# Patient Record
Sex: Female | Born: 1950 | Race: White | Hispanic: No | Marital: Married | Smoking: Former smoker
Health system: Southern US, Community
[De-identification: ages and names within clinical notes are randomized; demographics above are authoritative.]

## PROBLEM LIST (undated history)

## (undated) DIAGNOSIS — J449 Chronic obstructive pulmonary disease, unspecified: Secondary | ICD-10-CM

## (undated) DIAGNOSIS — E119 Type 2 diabetes mellitus without complications: Secondary | ICD-10-CM

## (undated) DIAGNOSIS — J189 Pneumonia, unspecified organism: Secondary | ICD-10-CM

## (undated) DIAGNOSIS — C801 Malignant (primary) neoplasm, unspecified: Secondary | ICD-10-CM

## (undated) HISTORY — PX: CERVICAL SPINE SURGERY: SHX589

## (undated) HISTORY — PX: EYE SURGERY: SHX253

## (undated) HISTORY — PX: BREAST ENHANCEMENT SURGERY: SHX7

---

## 1981-07-22 HISTORY — PX: BREAST SURGERY: SHX581

## 1984-07-22 HISTORY — PX: TUBAL LIGATION: SHX77

## 1994-07-22 HISTORY — PX: ABDOMINAL HYSTERECTOMY: SHX81

## 2004-09-19 ENCOUNTER — Ambulatory Visit: Payer: Self-pay | Admitting: Gynecologic Oncology

## 2004-10-02 ENCOUNTER — Ambulatory Visit: Payer: Self-pay | Admitting: Family Medicine

## 2004-12-04 ENCOUNTER — Encounter: Admission: RE | Admit: 2004-12-04 | Discharge: 2004-12-04 | Payer: Self-pay | Admitting: Rheumatology

## 2005-10-09 ENCOUNTER — Ambulatory Visit: Payer: Self-pay | Admitting: Family Medicine

## 2005-10-11 ENCOUNTER — Ambulatory Visit: Payer: Self-pay | Admitting: Family Medicine

## 2005-10-15 ENCOUNTER — Emergency Department (HOSPITAL_COMMUNITY): Admission: EM | Admit: 2005-10-15 | Discharge: 2005-10-15 | Payer: Self-pay | Admitting: Emergency Medicine

## 2005-10-18 ENCOUNTER — Ambulatory Visit: Payer: Self-pay | Admitting: Family Medicine

## 2006-09-12 ENCOUNTER — Ambulatory Visit: Payer: Self-pay | Admitting: Family Medicine

## 2007-12-12 ENCOUNTER — Emergency Department: Payer: Self-pay | Admitting: Emergency Medicine

## 2008-06-14 ENCOUNTER — Ambulatory Visit: Payer: Self-pay | Admitting: Family Medicine

## 2008-08-08 ENCOUNTER — Ambulatory Visit: Payer: Self-pay | Admitting: Family Medicine

## 2009-03-21 DIAGNOSIS — R1084 Generalized abdominal pain: Secondary | ICD-10-CM | POA: Insufficient documentation

## 2009-04-27 ENCOUNTER — Ambulatory Visit: Payer: Self-pay | Admitting: Internal Medicine

## 2009-05-03 ENCOUNTER — Ambulatory Visit: Payer: Self-pay | Admitting: Gastroenterology

## 2009-05-09 ENCOUNTER — Ambulatory Visit: Payer: Self-pay | Admitting: Gastroenterology

## 2009-11-17 ENCOUNTER — Ambulatory Visit: Payer: Self-pay | Admitting: Family Medicine

## 2009-12-01 DIAGNOSIS — J309 Allergic rhinitis, unspecified: Secondary | ICD-10-CM | POA: Insufficient documentation

## 2010-01-03 ENCOUNTER — Ambulatory Visit: Payer: Self-pay | Admitting: Gastroenterology

## 2010-12-19 ENCOUNTER — Inpatient Hospital Stay: Payer: Self-pay | Admitting: Surgery

## 2010-12-20 HISTORY — PX: CHOLECYSTECTOMY: SHX55

## 2010-12-25 LAB — PATHOLOGY REPORT

## 2012-12-23 ENCOUNTER — Ambulatory Visit (HOSPITAL_COMMUNITY): Payer: Self-pay | Admitting: Psychiatry

## 2014-08-29 ENCOUNTER — Inpatient Hospital Stay: Payer: Self-pay | Admitting: Internal Medicine

## 2014-09-12 LAB — TSH: TSH: 1.67 u[IU]/mL (ref 0.41–5.90)

## 2014-09-12 LAB — BASIC METABOLIC PANEL
BUN: 33 mg/dL — AB (ref 4–21)
Creatinine: 0.9 mg/dL (ref 0.5–1.1)
GLUCOSE: 106 mg/dL
Sodium: 142 mmol/L (ref 137–147)

## 2014-09-14 ENCOUNTER — Inpatient Hospital Stay: Payer: Self-pay | Admitting: Internal Medicine

## 2014-10-22 ENCOUNTER — Other Ambulatory Visit
Admit: 2014-10-22 | Disposition: A | Discharge status: Home / Self Care | Payer: Self-pay | Attending: Physician Assistant | Admitting: Physician Assistant

## 2014-10-22 ENCOUNTER — Other Ambulatory Visit
Admit: 2014-10-22 | Disposition: A | Discharge status: Home / Self Care | Payer: Self-pay | Attending: Family Medicine | Admitting: Family Medicine

## 2014-10-22 LAB — URINALYSIS, COMPLETE
BLOOD: NEGATIVE
Bilirubin,UR: NEGATIVE
GLUCOSE, UR: NEGATIVE mg/dL (ref 0–75)
NITRITE: NEGATIVE
Ph: 5 (ref 4.5–8.0)
RBC,UR: NONE SEEN /HPF (ref 0–5)
SPECIFIC GRAVITY: 1.023 (ref 1.003–1.030)
Squamous Epithelial: 15
WBC UR: 1 /HPF (ref 0–5)

## 2014-10-22 LAB — RAPID INFLUENZA A&B ANTIGENS

## 2014-10-23 ENCOUNTER — Emergency Department
Admit: 2014-10-23 | Disposition: A | Discharge status: Other Acute Inpatient Hospital | Payer: Self-pay | Admitting: Student

## 2014-10-23 LAB — COMPREHENSIVE METABOLIC PANEL
ALK PHOS: 118 U/L
Albumin: 2.7 g/dL — ABNORMAL LOW
Anion Gap: 13 (ref 7–16)
BUN: 40 mg/dL — ABNORMAL HIGH
Bilirubin,Total: 0.6 mg/dL
Calcium, Total: 8.6 mg/dL — ABNORMAL LOW
Chloride: 93 mmol/L — ABNORMAL LOW
Co2: 26 mmol/L
Creatinine: 2.19 mg/dL — ABNORMAL HIGH
EGFR (Non-African Amer.): 23 — ABNORMAL LOW
GFR CALC AF AMER: 27 — AB
GLUCOSE: 117 mg/dL — AB
POTASSIUM: 4 mmol/L
SGOT(AST): 24 U/L
SGPT (ALT): 10 U/L — ABNORMAL LOW
Sodium: 132 mmol/L — ABNORMAL LOW
TOTAL PROTEIN: 6.8 g/dL

## 2014-10-23 LAB — CBC
HCT: 42.3 % (ref 35.0–47.0)
HGB: 13.1 g/dL (ref 12.0–16.0)
MCH: 26.5 pg (ref 26.0–34.0)
MCHC: 31 g/dL — AB (ref 32.0–36.0)
MCV: 86 fL (ref 80–100)
PLATELETS: 373 10*3/uL (ref 150–440)
RBC: 4.94 10*6/uL (ref 3.80–5.20)
RDW: 14.7 % — AB (ref 11.5–14.5)
WBC: 40.8 10*3/uL — ABNORMAL HIGH (ref 3.6–11.0)

## 2014-10-23 LAB — URINALYSIS, COMPLETE
BILIRUBIN, UR: NEGATIVE
BLOOD: NEGATIVE
GLUCOSE, UR: NEGATIVE mg/dL (ref 0–75)
Hyaline Cast: 10
Ketone: NEGATIVE
Nitrite: NEGATIVE
PH: 5 (ref 4.5–8.0)
Protein: 30
RBC,UR: 20 /HPF (ref 0–5)
SPECIFIC GRAVITY: 1.026 (ref 1.003–1.030)
WBC UR: 15 /HPF (ref 0–5)

## 2014-10-23 LAB — MAGNESIUM: Magnesium: 1.8 mg/dL

## 2014-10-23 LAB — TROPONIN I: Troponin-I: 0.08 ng/mL — ABNORMAL HIGH

## 2014-10-23 LAB — PHOSPHORUS: Phosphorus: 4.5 mg/dL

## 2014-10-23 LAB — CLOSTRIDIUM DIFFICILE(ARMC)

## 2014-10-23 LAB — LACTIC ACID, PLASMA: LACTIC ACID, VENOUS: 2.4 mmol/L — AB

## 2014-10-23 LAB — PROTIME-INR
INR: 1.1
Prothrombin Time: 14 secs

## 2014-10-24 DIAGNOSIS — F05 Delirium due to known physiological condition: Secondary | ICD-10-CM | POA: Insufficient documentation

## 2014-10-24 DIAGNOSIS — Z87898 Personal history of other specified conditions: Secondary | ICD-10-CM | POA: Insufficient documentation

## 2014-10-24 DIAGNOSIS — Z8768 Personal history of other (corrected) conditions arising in the perinatal period: Secondary | ICD-10-CM | POA: Insufficient documentation

## 2014-10-28 LAB — CULTURE, BLOOD (SINGLE)

## 2014-10-31 DIAGNOSIS — S37009A Unspecified injury of unspecified kidney, initial encounter: Secondary | ICD-10-CM | POA: Insufficient documentation

## 2014-10-31 LAB — URINE CULTURE

## 2014-11-10 ENCOUNTER — Inpatient Hospital Stay
Admission: EM | Admit: 2014-11-10 | Discharge: 2014-11-28 | DRG: 871 | Disposition: A | Discharge status: Skilled Nursing Facility | Payer: BLUE CROSS/BLUE SHIELD | Attending: Internal Medicine | Admitting: Internal Medicine

## 2014-11-10 DIAGNOSIS — J9600 Acute respiratory failure, unspecified whether with hypoxia or hypercapnia: Secondary | ICD-10-CM

## 2014-11-10 DIAGNOSIS — Z888 Allergy status to other drugs, medicaments and biological substances status: Secondary | ICD-10-CM | POA: Diagnosis not present

## 2014-11-10 DIAGNOSIS — Z880 Allergy status to penicillin: Secondary | ICD-10-CM

## 2014-11-10 DIAGNOSIS — K219 Gastro-esophageal reflux disease without esophagitis: Secondary | ICD-10-CM | POA: Diagnosis present

## 2014-11-10 DIAGNOSIS — R918 Other nonspecific abnormal finding of lung field: Secondary | ICD-10-CM

## 2014-11-10 DIAGNOSIS — R627 Adult failure to thrive: Secondary | ICD-10-CM | POA: Diagnosis present

## 2014-11-10 DIAGNOSIS — Z8701 Personal history of pneumonia (recurrent): Secondary | ICD-10-CM

## 2014-11-10 DIAGNOSIS — Z9981 Dependence on supplemental oxygen: Secondary | ICD-10-CM

## 2014-11-10 DIAGNOSIS — Z79899 Other long term (current) drug therapy: Secondary | ICD-10-CM | POA: Diagnosis not present

## 2014-11-10 DIAGNOSIS — J449 Chronic obstructive pulmonary disease, unspecified: Secondary | ICD-10-CM | POA: Diagnosis present

## 2014-11-10 DIAGNOSIS — J9611 Chronic respiratory failure with hypoxia: Secondary | ICD-10-CM | POA: Diagnosis present

## 2014-11-10 DIAGNOSIS — J9621 Acute and chronic respiratory failure with hypoxia: Secondary | ICD-10-CM | POA: Diagnosis present

## 2014-11-10 DIAGNOSIS — F411 Generalized anxiety disorder: Secondary | ICD-10-CM | POA: Diagnosis present

## 2014-11-10 DIAGNOSIS — Z791 Long term (current) use of non-steroidal anti-inflammatories (NSAID): Secondary | ICD-10-CM

## 2014-11-10 DIAGNOSIS — Z7951 Long term (current) use of inhaled steroids: Secondary | ICD-10-CM | POA: Diagnosis not present

## 2014-11-10 DIAGNOSIS — A419 Sepsis, unspecified organism: Principal | ICD-10-CM | POA: Diagnosis present

## 2014-11-10 DIAGNOSIS — Z882 Allergy status to sulfonamides status: Secondary | ICD-10-CM | POA: Diagnosis not present

## 2014-11-10 DIAGNOSIS — F329 Major depressive disorder, single episode, unspecified: Secondary | ICD-10-CM | POA: Diagnosis not present

## 2014-11-10 DIAGNOSIS — J189 Pneumonia, unspecified organism: Secondary | ICD-10-CM | POA: Diagnosis present

## 2014-11-10 DIAGNOSIS — Z885 Allergy status to narcotic agent status: Secondary | ICD-10-CM

## 2014-11-10 DIAGNOSIS — Z91040 Latex allergy status: Secondary | ICD-10-CM

## 2014-11-10 DIAGNOSIS — Y95 Nosocomial condition: Secondary | ICD-10-CM | POA: Diagnosis present

## 2014-11-10 DIAGNOSIS — Z8249 Family history of ischemic heart disease and other diseases of the circulatory system: Secondary | ICD-10-CM

## 2014-11-10 DIAGNOSIS — Z8589 Personal history of malignant neoplasm of other organs and systems: Secondary | ICD-10-CM

## 2014-11-10 DIAGNOSIS — E875 Hyperkalemia: Secondary | ICD-10-CM | POA: Diagnosis not present

## 2014-11-10 DIAGNOSIS — Z91013 Allergy to seafood: Secondary | ICD-10-CM

## 2014-11-10 DIAGNOSIS — E119 Type 2 diabetes mellitus without complications: Secondary | ICD-10-CM | POA: Diagnosis present

## 2014-11-10 DIAGNOSIS — Z881 Allergy status to other antibiotic agents status: Secondary | ICD-10-CM

## 2014-11-10 DIAGNOSIS — R410 Disorientation, unspecified: Secondary | ICD-10-CM

## 2014-11-10 DIAGNOSIS — Z87891 Personal history of nicotine dependence: Secondary | ICD-10-CM

## 2014-11-10 DIAGNOSIS — Z886 Allergy status to analgesic agent status: Secondary | ICD-10-CM

## 2014-11-10 DIAGNOSIS — R6521 Severe sepsis with septic shock: Secondary | ICD-10-CM | POA: Diagnosis present

## 2014-11-10 DIAGNOSIS — D638 Anemia in other chronic diseases classified elsewhere: Secondary | ICD-10-CM | POA: Diagnosis present

## 2014-11-10 DIAGNOSIS — J439 Emphysema, unspecified: Secondary | ICD-10-CM | POA: Diagnosis present

## 2014-11-10 HISTORY — DX: Malignant (primary) neoplasm, unspecified: C80.1

## 2014-11-10 HISTORY — DX: Type 2 diabetes mellitus without complications: E11.9

## 2014-11-10 HISTORY — DX: Chronic obstructive pulmonary disease, unspecified: J44.9

## 2014-11-10 HISTORY — DX: Pneumonia, unspecified organism: J18.9

## 2014-11-10 LAB — CBC
HCT: 36 % (ref 35.0–47.0)
HGB: 10.7 g/dL — ABNORMAL LOW (ref 12.0–16.0)
MCH: 26.7 pg (ref 26.0–34.0)
MCHC: 29.8 g/dL — ABNORMAL LOW (ref 32.0–36.0)
MCV: 90 fL (ref 80–100)
PLATELETS: 487 10*3/uL — AB (ref 150–440)
RBC: 4.01 10*6/uL (ref 3.80–5.20)
RDW: 17.8 % — AB (ref 11.5–14.5)
WBC: 33.7 10*3/uL — AB (ref 3.6–11.0)

## 2014-11-10 LAB — COMPREHENSIVE METABOLIC PANEL
ALT: 20 U/L
Albumin: 2.9 g/dL — ABNORMAL LOW
Alkaline Phosphatase: 81 U/L
Anion Gap: 13 (ref 7–16)
BILIRUBIN TOTAL: 1 mg/dL
BUN: 19 mg/dL
CHLORIDE: 105 mmol/L
Calcium, Total: 9.2 mg/dL
Co2: 21 mmol/L — ABNORMAL LOW
Creatinine: 0.99 mg/dL
EGFR (African American): >60
Glucose: 203 mg/dL — ABNORMAL HIGH
POTASSIUM: 4.6 mmol/L
SGOT(AST): 46 U/L — ABNORMAL HIGH
Sodium: 139 mmol/L
TOTAL PROTEIN: 6.9 g/dL

## 2014-11-10 LAB — PROTIME-INR
INR: 1.2
Prothrombin Time: 15.1 secs — ABNORMAL HIGH

## 2014-11-10 LAB — PRO B NATRIURETIC PEPTIDE: B-Type Natriuretic Peptide: 455 pg/mL — ABNORMAL HIGH

## 2014-11-10 LAB — TROPONIN I: Troponin-I: <0.03 ng/mL

## 2014-11-10 LAB — CK TOTAL AND CKMB (NOT AT ARMC)
CK, TOTAL: 34 U/L — AB
CK-MB: 2.4 ng/mL

## 2014-11-10 LAB — LACTIC ACID, PLASMA: Lactic Acid, Venous: 7.9 mmol/L

## 2014-11-11 LAB — BASIC METABOLIC PANEL
ANION GAP: 6 — AB (ref 7–16)
BUN: 24 mg/dL — ABNORMAL HIGH
CALCIUM: 8.4 mg/dL — AB
CHLORIDE: 113 mmol/L — AB
CO2: 20 mmol/L — AB
Creatinine: 0.83 mg/dL
Glucose: 182 mg/dL — ABNORMAL HIGH
Potassium: 4.1 mmol/L
Sodium: 139 mmol/L

## 2014-11-11 LAB — CBC WITH DIFFERENTIAL/PLATELET
BASOS ABS: 0 10*3/uL (ref 0.0–0.1)
Basophil %: 0.2 %
EOS ABS: 0 10*3/uL (ref 0.0–0.7)
Eosinophil %: 0 %
HCT: 29.1 % — ABNORMAL LOW (ref 35.0–47.0)
HGB: 8.9 g/dL — ABNORMAL LOW (ref 12.0–16.0)
Lymphocyte #: 1.1 10*3/uL (ref 1.0–3.6)
Lymphocyte %: 5.2 %
MCH: 27 pg (ref 26.0–34.0)
MCHC: 30.5 g/dL — AB (ref 32.0–36.0)
MCV: 89 fL (ref 80–100)
MONO ABS: 0.6 x10 3/mm (ref 0.2–0.9)
Monocyte %: 2.9 %
NEUTROS PCT: 91.7 %
Neutrophil #: 19.3 10*3/uL — ABNORMAL HIGH (ref 1.4–6.5)
PLATELETS: 419 10*3/uL (ref 150–440)
RBC: 3.28 10*6/uL — AB (ref 3.80–5.20)
RDW: 17.7 % — ABNORMAL HIGH (ref 11.5–14.5)
WBC: 21.1 10*3/uL — ABNORMAL HIGH (ref 3.6–11.0)

## 2014-11-11 LAB — TROPONIN I

## 2014-11-12 LAB — BASIC METABOLIC PANEL
Anion Gap: 5 — ABNORMAL LOW (ref 7–16)
BUN: 27 mg/dL — AB
CHLORIDE: 108 mmol/L
Calcium, Total: 8.8 mg/dL — ABNORMAL LOW
Co2: 23 mmol/L
Creatinine: 0.73 mg/dL
EGFR (Non-African Amer.): >60
GLUCOSE: 124 mg/dL — AB
Potassium: 3.9 mmol/L
Sodium: 136 mmol/L

## 2014-11-12 LAB — CBC WITH DIFFERENTIAL/PLATELET
BASOS PCT: 0.1 %
Basophil #: 0 10*3/uL (ref 0.0–0.1)
EOS PCT: 0 %
Eosinophil #: 0 10*3/uL (ref 0.0–0.7)
HCT: 25.8 % — ABNORMAL LOW (ref 35.0–47.0)
HGB: 8 g/dL — ABNORMAL LOW (ref 12.0–16.0)
Lymphocyte #: 0.7 10*3/uL — ABNORMAL LOW (ref 1.0–3.6)
Lymphocyte %: 4.4 %
MCH: 27.2 pg (ref 26.0–34.0)
MCHC: 30.9 g/dL — ABNORMAL LOW (ref 32.0–36.0)
MCV: 88 fL (ref 80–100)
MONO ABS: 0.7 x10 3/mm (ref 0.2–0.9)
Monocyte %: 4.2 %
NEUTROS PCT: 91.3 %
Neutrophil #: 14.5 10*3/uL — ABNORMAL HIGH (ref 1.4–6.5)
Platelet: 384 10*3/uL (ref 150–440)
RBC: 2.93 10*6/uL — ABNORMAL LOW (ref 3.80–5.20)
RDW: 17.7 % — ABNORMAL HIGH (ref 11.5–14.5)
WBC: 15.8 10*3/uL — ABNORMAL HIGH (ref 3.6–11.0)

## 2014-11-12 LAB — PHOSPHORUS: Phosphorus: 3.3 mg/dL

## 2014-11-12 LAB — MAGNESIUM: MAGNESIUM: 1.8 mg/dL

## 2014-11-13 LAB — IRON AND TIBC
Iron Bind.Cap.(Total): 234 — ABNORMAL LOW (ref 250–450)
Iron Saturation: 21.4
Iron: 50 ug/dL
Unbound Iron-Bind.Cap.: 183.8

## 2014-11-13 LAB — BASIC METABOLIC PANEL
Anion Gap: 2 — ABNORMAL LOW (ref 7–16)
BUN: 36 mg/dL — ABNORMAL HIGH
Calcium, Total: 9 mg/dL
Chloride: 111 mmol/L
Co2: 25 mmol/L
Creatinine: 0.73 mg/dL
EGFR (African American): >60
EGFR (Non-African Amer.): >60
GLUCOSE: 122 mg/dL — AB
Potassium: 4.3 mmol/L
SODIUM: 138 mmol/L

## 2014-11-13 LAB — VANCOMYCIN, TROUGH: VANCOMYCIN, TROUGH: 11 ug/mL

## 2014-11-13 LAB — FERRITIN: Ferritin (ARMC): 205 ng/mL

## 2014-11-13 LAB — CBC WITH DIFFERENTIAL/PLATELET
Basophil #: 0 10*3/uL (ref 0.0–0.1)
Basophil %: 0.1 %
Eosinophil #: 0 10*3/uL (ref 0.0–0.7)
Eosinophil %: 0 %
HCT: 24.8 % — ABNORMAL LOW (ref 35.0–47.0)
HGB: 7.7 g/dL — AB (ref 12.0–16.0)
LYMPHS PCT: 5.1 %
Lymphocyte #: 0.5 10*3/uL — ABNORMAL LOW (ref 1.0–3.6)
MCH: 27.2 pg (ref 26.0–34.0)
MCHC: 31.2 g/dL — ABNORMAL LOW (ref 32.0–36.0)
MCV: 87 fL (ref 80–100)
Monocyte #: 0.6 x10 3/mm (ref 0.2–0.9)
Monocyte %: 5.5 %
NEUTROS PCT: 89.3 %
Neutrophil #: 9.1 10*3/uL — ABNORMAL HIGH (ref 1.4–6.5)
PLATELETS: 371 10*3/uL (ref 150–440)
RBC: 2.85 10*6/uL — AB (ref 3.80–5.20)
RDW: 17.3 % — ABNORMAL HIGH (ref 11.5–14.5)
WBC: 10.1 10*3/uL (ref 3.6–11.0)

## 2014-11-14 LAB — CBC WITH DIFFERENTIAL/PLATELET
Bands: 0 %
HCT: 26.6 % — ABNORMAL LOW (ref 35.0–47.0)
HGB: 8.3 g/dL — ABNORMAL LOW (ref 12.0–16.0)
LYMPHS PCT: 12 %
MCH: 27 pg (ref 26.0–34.0)
MCHC: 31 g/dL — ABNORMAL LOW (ref 32.0–36.0)
MCV: 87 fL (ref 80–100)
MYELOCYTE: 2 %
Metamyelocyte: 5 %
Monocytes: 3 %
PLATELETS: 403 10*3/uL (ref 150–440)
RBC: 3.06 10*6/uL — AB (ref 3.80–5.20)
RDW: 17.2 % — ABNORMAL HIGH (ref 11.5–14.5)
SEGMENTED NEUTROPHILS: 78 %
WBC: 12.4 10*3/uL — AB (ref 3.6–11.0)

## 2014-11-14 LAB — BASIC METABOLIC PANEL
Anion Gap: 1 — ABNORMAL LOW (ref 7–16)
BUN: 35 mg/dL — ABNORMAL HIGH
CHLORIDE: 109 mmol/L
CO2: 31 mmol/L
Calcium, Total: 9.2 mg/dL
Creatinine: 0.6 mg/dL
GLUCOSE: 116 mg/dL — AB
Potassium: 4.8 mmol/L
Sodium: 141 mmol/L

## 2014-11-14 LAB — EXPECTORATED SPUTUM ASSESSMENT W REFEX TO RESP CULTURE

## 2014-11-14 LAB — VANCOMYCIN, TROUGH: Vancomycin, Trough: 21 ug/mL

## 2014-11-15 LAB — BASIC METABOLIC PANEL
Anion Gap: 2 — ABNORMAL LOW (ref 7–16)
BUN: 33 mg/dL — ABNORMAL HIGH
CO2: 32 mmol/L
Calcium, Total: 9.1 mg/dL
Chloride: 105 mmol/L
Creatinine: 0.5 mg/dL
EGFR (African American): >60
EGFR (Non-African Amer.): >60
GLUCOSE: 111 mg/dL — AB
Potassium: 4.2 mmol/L
Sodium: 139 mmol/L

## 2014-11-15 LAB — CBC WITH DIFFERENTIAL/PLATELET
BANDS NEUTROPHIL: 3 %
HCT: 26 % — ABNORMAL LOW (ref 35.0–47.0)
HGB: 8.2 g/dL — AB (ref 12.0–16.0)
LYMPHS PCT: 18 %
MCH: 26.9 pg (ref 26.0–34.0)
MCHC: 31.4 g/dL — ABNORMAL LOW (ref 32.0–36.0)
MCV: 86 fL (ref 80–100)
Metamyelocyte: 7 %
Monocytes: 7 %
Myelocyte: 2 %
Platelet: 400 10*3/uL (ref 150–440)
RBC: 3.03 10*6/uL — AB (ref 3.80–5.20)
RDW: 17.2 % — ABNORMAL HIGH (ref 11.5–14.5)
SEGMENTED NEUTROPHILS: 63 %
WBC: 12.2 10*3/uL — AB (ref 3.6–11.0)

## 2014-11-15 LAB — CULTURE, BLOOD (SINGLE)

## 2014-11-16 LAB — CBC WITH DIFFERENTIAL/PLATELET
Bands: 1 %
Eosinophil: 2 %
HCT: 29.1 % — ABNORMAL LOW (ref 35.0–47.0)
HGB: 9 g/dL — ABNORMAL LOW (ref 12.0–16.0)
Lymphocytes: 15 %
MCH: 26.5 pg (ref 26.0–34.0)
MCHC: 30.9 g/dL — ABNORMAL LOW (ref 32.0–36.0)
MCV: 86 fL (ref 80–100)
METAMYELOCYTE: 8 %
MONOS PCT: 12 %
Myelocyte: 8 %
PLATELETS: 487 10*3/uL — AB (ref 150–440)
RBC: 3.39 10*6/uL — ABNORMAL LOW (ref 3.80–5.20)
RDW: 17.3 % — ABNORMAL HIGH (ref 11.5–14.5)
Segmented Neutrophils: 54 %
WBC: 18.7 10*3/uL — ABNORMAL HIGH (ref 3.6–11.0)

## 2014-11-16 LAB — BASIC METABOLIC PANEL
ANION GAP: 5 — AB (ref 7–16)
BUN: 33 mg/dL — AB
Calcium, Total: 8.9 mg/dL
Chloride: 100 mmol/L — ABNORMAL LOW
Co2: 34 mmol/L — ABNORMAL HIGH
Creatinine: 0.43 mg/dL — ABNORMAL LOW
EGFR (African American): >60
EGFR (Non-African Amer.): >60
GLUCOSE: 112 mg/dL — AB
POTASSIUM: 4 mmol/L
Sodium: 139 mmol/L

## 2014-11-16 LAB — BRONCHIAL WASH CULTURE

## 2014-11-17 LAB — BASIC METABOLIC PANEL
ANION GAP: 6 — AB (ref 7–16)
BUN: 20 mg/dL
CALCIUM: 8.8 mg/dL — AB
Chloride: 93 mmol/L — ABNORMAL LOW
Co2: 38 mmol/L — ABNORMAL HIGH
Creatinine: 0.44 mg/dL
EGFR (Non-African Amer.): >60
Glucose: 112 mg/dL — ABNORMAL HIGH
Potassium: 3.3 mmol/L — ABNORMAL LOW
Sodium: 137 mmol/L

## 2014-11-17 LAB — CBC WITH DIFFERENTIAL/PLATELET
Comment - H1-Com5: NORMAL
HCT: 29 % — ABNORMAL LOW (ref 35.0–47.0)
HGB: 9.1 g/dL — ABNORMAL LOW (ref 12.0–16.0)
Lymphocytes: 27 %
MCH: 26.7 pg (ref 26.0–34.0)
MCHC: 31.4 g/dL — AB (ref 32.0–36.0)
MCV: 85 fL (ref 80–100)
MONOS PCT: 7 %
Myelocyte: 1 %
PLATELETS: 511 10*3/uL — AB (ref 150–440)
RBC: 3.41 10*6/uL — ABNORMAL LOW (ref 3.80–5.20)
RDW: 17.8 % — ABNORMAL HIGH (ref 11.5–14.5)
Segmented Neutrophils: 65 %
WBC: 18 10*3/uL — AB (ref 3.6–11.0)

## 2014-11-18 LAB — BASIC METABOLIC PANEL
ANION GAP: 7 (ref 7–16)
BUN: 12 mg/dL
CALCIUM: 8.9 mg/dL
Chloride: 95 mmol/L — ABNORMAL LOW
Co2: 36 mmol/L — ABNORMAL HIGH
Creatinine: 0.49 mg/dL
EGFR (African American): >60
EGFR (Non-African Amer.): >60
Glucose: 129 mg/dL — ABNORMAL HIGH
Potassium: 3.2 mmol/L — ABNORMAL LOW
SODIUM: 138 mmol/L

## 2014-11-18 LAB — PHOSPHORUS: Phosphorus: 2.5 mg/dL

## 2014-11-18 LAB — MAGNESIUM: Magnesium: 1.6 mg/dL — ABNORMAL LOW

## 2014-11-18 LAB — HEMOGLOBIN A1C: Hemoglobin A1C: 5.3 %

## 2014-11-19 MED ORDER — ONDANSETRON HCL 4 MG/2ML IJ SOLN
4.0000 mg | INTRAMUSCULAR | Status: DC | PRN
  Filled 2014-11-19: qty 2

## 2014-11-19 MED ORDER — SODIUM CHLORIDE 0.9 % IJ SOLN
5.0000 mL | Freq: Every day | INTRAMUSCULAR | Status: DC
  Administered 2014-11-20 – 2014-11-21 (×2): 5 mL via INTRAVENOUS

## 2014-11-19 MED ORDER — BUDESONIDE 0.5 MG/2ML IN SUSP
0.5000 mg | Freq: Two times a day (BID) | RESPIRATORY_TRACT | Status: DC
  Administered 2014-11-20: 0.5 mg via RESPIRATORY_TRACT
  Filled 2014-11-19: qty 2

## 2014-11-19 MED ORDER — DULOXETINE HCL 60 MG PO CPEP
90.0000 mg | ORAL_CAPSULE | ORAL | Status: DC
  Administered 2014-11-20 – 2014-11-28 (×8): 90 mg via ORAL
  Filled 2014-11-19 (×10): qty 1

## 2014-11-19 MED ORDER — LORAZEPAM 2 MG/ML IJ SOLN: 1.0000 mg | INTRAMUSCULAR | Status: DC | PRN

## 2014-11-19 MED ORDER — ENSURE ENLIVE PO LIQD
237.0000 mL | Freq: Two times a day (BID) | ORAL | Status: DC
  Administered 2014-11-20 – 2014-11-27 (×14): 237 mL via ORAL

## 2014-11-19 MED ORDER — MORPHINE SULFATE 15 MG PO TABS
7.5000 mg | ORAL_TABLET | ORAL | Status: DC | PRN
  Administered 2014-11-20 – 2014-11-21 (×4): 7.5 mg via ORAL
  Administered 2014-11-21: 22:00:00 via ORAL
  Administered 2014-11-22: 15 mg via ORAL
  Administered 2014-11-22 – 2014-11-28 (×10): 7.5 mg via ORAL
  Filled 2014-11-19 (×16): qty 1

## 2014-11-19 MED ORDER — HEPARIN SODIUM (PORCINE) 5000 UNIT/ML IJ SOLN
5000.0000 [IU] | Freq: Three times a day (TID) | INTRAMUSCULAR | Status: DC
  Administered 2014-11-20 – 2014-11-28 (×24): 5000 [IU] via SUBCUTANEOUS
  Filled 2014-11-19 (×24): qty 1

## 2014-11-19 MED ORDER — POTASSIUM CHLORIDE CRYS ER 20 MEQ PO TBCR
20.0000 meq | EXTENDED_RELEASE_TABLET | Freq: Every day | ORAL | Status: DC
  Administered 2014-11-20 – 2014-11-26 (×7): 20 meq via ORAL
  Filled 2014-11-19 (×7): qty 1

## 2014-11-19 MED ORDER — GUAIFENESIN ER 600 MG PO TB12
600.0000 mg | ORAL_TABLET | Freq: Two times a day (BID) | ORAL | Status: DC
  Administered 2014-11-20 – 2014-11-27 (×16): 600 mg via ORAL
  Filled 2014-11-19 (×16): qty 1

## 2014-11-19 MED ORDER — FLUTICASONE PROPIONATE 50 MCG/ACT NA SUSP
2.0000 | Freq: Every day | NASAL | Status: DC
  Administered 2014-11-20 – 2014-11-28 (×9): 2 via NASAL
  Filled 2014-11-19 (×2): qty 16

## 2014-11-19 MED ORDER — BUDESONIDE-FORMOTEROL FUMARATE 160-4.5 MCG/ACT IN AERO
2.0000 | INHALATION_SPRAY | Freq: Two times a day (BID) | RESPIRATORY_TRACT | Status: DC
  Administered 2014-11-20 – 2014-11-28 (×16): 2 via RESPIRATORY_TRACT

## 2014-11-19 MED ORDER — RABEPRAZOLE SODIUM 20 MG PO TBEC
20.0000 mg | DELAYED_RELEASE_TABLET | Freq: Every day | ORAL | Status: DC
  Administered 2014-11-20 – 2014-11-28 (×9): 20 mg via ORAL

## 2014-11-19 MED ORDER — INSULIN ASPART 100 UNIT/ML ~~LOC~~ SOLN
0.0000 [IU] | SUBCUTANEOUS | Status: DC
  Administered 2014-11-20: 2 [IU] via SUBCUTANEOUS
  Administered 2014-11-21: 4 [IU] via SUBCUTANEOUS
  Administered 2014-11-22: 2 [IU] via SUBCUTANEOUS
  Administered 2014-11-23: 4 [IU] via SUBCUTANEOUS
  Filled 2014-11-19 (×2): qty 4
  Filled 2014-11-19 (×2): qty 2

## 2014-11-19 MED ORDER — SENNOSIDES 8.8 MG/5ML PO SYRP
10.0000 mL | ORAL_SOLUTION | Freq: Three times a day (TID) | ORAL | Status: DC | PRN
  Filled 2014-11-19 (×2): qty 10

## 2014-11-19 MED ORDER — ACETAMINOPHEN 325 MG PO TABS: 650.0000 mg | ORAL_TABLET | ORAL | Status: DC | PRN

## 2014-11-19 MED ORDER — SODIUM CHLORIDE 0.9 % IV SOLN
1.0000 g | Freq: Three times a day (TID) | INTRAVENOUS | Status: AC
  Administered 2014-11-20 (×3): 1 g via INTRAVENOUS
  Filled 2014-11-19: qty 1

## 2014-11-19 MED ORDER — ALPRAZOLAM 0.25 MG PO TABS
0.2500 mg | ORAL_TABLET | Freq: Three times a day (TID) | ORAL | Status: DC
  Administered 2014-11-20 – 2014-11-27 (×20): 0.25 mg via ORAL
  Filled 2014-11-19 (×21): qty 1

## 2014-11-19 MED ORDER — IPRATROPIUM-ALBUTEROL 0.5-2.5 (3) MG/3ML IN SOLN
3.0000 mL | RESPIRATORY_TRACT | Status: DC
  Administered 2014-11-20 (×6): 3 mL via RESPIRATORY_TRACT
  Filled 2014-11-19 (×4): qty 3

## 2014-11-19 MED ORDER — ATORVASTATIN CALCIUM 20 MG PO TABS
40.0000 mg | ORAL_TABLET | Freq: Every day | ORAL | Status: DC
  Administered 2014-11-20 – 2014-11-27 (×7): 40 mg via ORAL
  Filled 2014-11-19 (×7): qty 2

## 2014-11-19 MED ORDER — FAMOTIDINE 20 MG PO TABS
20.0000 mg | ORAL_TABLET | ORAL | Status: DC
  Administered 2014-11-20 – 2014-11-27 (×8): 20 mg via ORAL
  Filled 2014-11-19 (×8): qty 1

## 2014-11-19 MED ORDER — SODIUM CHLORIDE 0.9 % IJ SOLN: 5.0000 mL | INTRAMUSCULAR | Status: DC | PRN

## 2014-11-20 LAB — GLUCOSE, CAPILLARY
GLUCOSE-CAPILLARY: 82 mg/dL (ref 70–99)
GLUCOSE-CAPILLARY: 111 mg/dL — AB (ref 70–99)
Glucose-Capillary: 84 mg/dL (ref 70–99)
Glucose-Capillary: 127 mg/dL — ABNORMAL HIGH (ref 70–99)
Glucose-Capillary: 90 mg/dL (ref 70–99)

## 2014-11-20 MED ORDER — IPRATROPIUM-ALBUTEROL 0.5-2.5 (3) MG/3ML IN SOLN
3.0000 mL | Freq: Four times a day (QID) | RESPIRATORY_TRACT | Status: DC
  Administered 2014-11-21 – 2014-11-28 (×27): 3 mL via RESPIRATORY_TRACT
  Filled 2014-11-20 (×28): qty 3

## 2014-11-20 MED ORDER — IPRATROPIUM-ALBUTEROL 0.5-2.5 (3) MG/3ML IN SOLN
3.0000 mL | RESPIRATORY_TRACT | Status: DC | PRN
  Administered 2014-11-21: 3 mL via RESPIRATORY_TRACT
  Filled 2014-11-20: qty 3

## 2014-11-20 NOTE — H&P (Signed)
PATIENT NAME:  Tiffany Mclean, Tiffany Mclean MR#:  [id number] DATE OF BIRTH:  October 14, 1950  DATE OF ADMISSION:  08/29/2014  ADMITTING PHYSICIAN:  Gladstone Lighter, MD  PRIMARY CARE PHYSICIAN: Kirstie Peri. Fisher, MD  CHIEF COMPLAINT: Decreased oxygenation on exertion.   HISTORY OF PRESENT ILLNESS: Tiffany Mclean is a 64 year old Caucasian female with past medical history significant for chronic respiratory failure secondary to COPD on 2 liters home oxygen, peripheral neuropathy, fibromyalgia, anxiety, depression, gastroesophageal reflex disease, who presents from PCP's office secondary to hypoxia. The patient states she is wheelchair-bound at baseline. Denies any worsening of her dyspnea than baseline.  She always has some chronic cough which is not worsened or has  changed. She always has fevers, chills, hot flushes all the time. She actually went for routine PCP visit; however, he noticed that on moving she was noted to be exertionally dyspneic, so he checked oxygen which was dropping into low 80s whenever she was moving, so sent her to the Emergency Room.    Her ABG here showed a pCO2 of greater than 100. Her pH was 7.27. The patient was alert and talking, so was placed on BiPAP. She was also hypoxic requiring 35% FiO2 on the BiPAP.  Other than some nausea, she has not felt any change overall.   PAST MEDICAL HISTORY: 1.  Chronic respiratory failure secondary to COPD, on home oxygen 2 liters.  2.  Fibromyalgia. 3.  Peripheral neuropathy. 4.  Chronic pain syndrome.  5.  Gastroesophageal reflex disease. 6.  Anxiety, depression.   PAST SURGICAL HISTORY:  1.  Complete hysterectomy.  2.  Cholecystectomy.  3.  Vaginal surgery for squamous cell carcinoma.  4.  Breast augmentation surgery.  ALLERGIES TO MEDICATIONS: ASPIRIN, AVELOX, CEPHALEXIN, CODEINE, FLAGYL, MELOXICAM,  SULFA DRUGS, TETRACYCLINE, TOPIRAMATE.   CURRENT HOME MEDICATIONS:  1.  AcipHex 20 mg daily p.r.n. for heartburn.  2.  Aleve 440 mg q. 8  hours p.r.n. for pain.  3.  Xanax 0.5 mg q. 12 hours as needed for anxiety.  4.  Cymbalta 60 mg p.o. daily.  5.  ProAir inhaler 2 puffs 4 times a day as needed for shortness of breath.  6.  Vitamin D3, 50,000 International Units 1 capsule once a week.   SOCIAL HISTORY: Lives at home with her husband. Wheelchair-bound due to weakness and pain of her hamstring muscles. Quit smoking more than 5 years ago. No alcohol use.   FAMILY HISTORY: Mom had hypertension.   REVIEW OF SYSTEMS:  CONSTITUTIONAL: No fever. Positive for chills, fatigue, and weakness.  EYES: No blurred vision, double vision, inflammation or glaucoma.  EARS, NOSE, THROAT: No tinnitus, ear pain, hearing loss, epistaxis, or discharge.  RESPIRATORY:  Positive for a cough, wheeze and COPD.  No hemoptysis. CARDIOVASCULAR: No chest pain, orthopnea, edema, or arrhythmia, palpitations, or syncope.  GASTROINTESTINAL: No nausea, vomiting, diarrhea, abdominal pain, hematemesis, or melena. Has history of IBS. GENITOURINARY: No dysuria, hematuria, renal calculus, frequency or incontinence.  ENDOCRINE: No polyuria, nocturia, thyroid problems, heat or cold intolerance.  HEMATOLOGY: No anemia, easy bruising, or bleeding.  SKIN: No acne, rash, or lesions.  MUSCULOSKELETAL: Positive for body aches, arthritis.  NEUROLOGIC: No numbness, weakness, CVA, TIA, or seizures.   PSYCHOLOGICAL: No anxiety, insomnia, or depression.   PHYSICAL EXAMINATION: VITAL SIGNS: Temperature 98.7 degrees Fahrenheit, pulse 94, respirations 18, blood pressure 124/58, pulse oximetry 98% on BiPAP. GENERAL: Well-built, well-nourished female sitting in bed, not in any acute distress on BiPAP. HEENT: Normocephalic, atraumatic. Pupils dilated, equal, round,  reacting to light. Anicteric sclerae. Extraocular movements intact. Oropharynx clear without erythema, mass, or exudates.  NECK: Supple. No thyromegaly, JVD, or carotid bruits. No lymphadenopathy.  LUNGS: Moving air  bilaterally, scattered wheezes. Decreased bibasilar breath sounds. No  use of accessory muscles for breathing.  CARDIOVASCULAR: S1, S2, regular rate and rhythm. No murmurs, rubs, or gallops.  CHEST:  The patient has prosthetic breasts.  ABDOMEN: Soft, nontender, nondistended. No hepatosplenomegaly. Normal bowel sounds.  EXTREMITIES: No pedal edema. No clubbing or cyanosis, 2+ dorsalis pedis pulses palpable bilaterally.  SKIN: No acne, rash, or lesions.  LYMPHATIC: No cervical or inguinal lymphadenopathy.  NEUROLOGIC: Cranial nerves intact. No focal motor or sensory deficits.  PSYCHOLOGICAL: The patient is awake, alert, oriented x 3.   LABORATORY DATA: WBC 6.9, hemoglobin 13.1, hematocrit 42.0, platelet count 255,000.  Sodium 138, potassium 4.1, chloride 94, bicarbonate 43, BUN 16, creatinine 0.63, glucose 100, and calcium of 8.8. ALT 13, AST 23, alkaline phosphatase 93, total bilirubin 0.4, albumin of 3.2. Venous pH of 7.25, pCO2 of 104. Troponin is negative.  IMAGING: Chest x-ray showing right middle lobe airspace disease, possible pneumonia. CT of the chest showing no evidence of any PE, diffuse pulmonary emphysema with scattered fibrosis and patchy infiltrate right middle lobe representing pneumonia. Pulmonary arterial  hypertension possibly present.   EKG showing sinus tachycardia with sinus rhythm with PVCs, heart rate of 93, no acute ST changes.   ASSESSMENT AND PLAN: A 64 year old female with chronic respiratory failure from chronic obstructive pulmonary disease on 2 liters home oxygen, chronic pain, fibromyalgia, wheelchair-bound at baseline. Sent in from primary care physician's office for exertional hypoxia.  1.  Acute hypercarbic, hypoxic respiratory failure. Admit to stepdown. Elevated pCO2, but patient is very alert since started on BiPAP. Repeat ABG in 2 hours, see if she can be weaned off BiPAP. She is on 2 liters home oxygen. See if she needs to be on increased amount of oxygen at  the time of discharge. Started Solu-Medrol and DuoNeb. She has multiple medication allergies. Levaquin has been started in the Emergency Room for pneumonia.  Continue for now if she tolerates it.  She only has some dizziness to Avelox. Check saturations, ambulatory saturations prior to discharge. See if she needs more oxygen. 2.  Fibromyalgia. Continue home pain medications.  3.  Gastroesophageal reflux disease. Continue proton pump inhibitor.  4.  Deep vein thrombosis prophylaxis.   CODE STATUS: Full code.   TIME SPENT ON ADMISSION: 50 minutes.   ____________________________ Gladstone Lighter, MD rk:LT D: 08/29/2014 20:45:34 ET T: 08/29/2014 21:24:03 ET JOB#: [id number]  cc: Gladstone Lighter, MD, <Dictator> Kirstie Peri. Caryn Section, MD Gladstone Lighter MD ELECTRONICALLY SIGNED 08/30/2014 10:52

## 2014-11-20 NOTE — Consult Note (Signed)
Psychiatry: Follow-up for this patient with pneumonia.  When I first saw her she was agitated and delirious.  Since then her delirium appears to have resolved.  On interview today her chief complaint is tiredness.  Still feels weak.  Denies shortness of breath.  On review of systems she denies depression denies suicidal ideation.  Still feels tired during the day.  Sleeps adequately at night.  Week but no specific pain. mental status she is very ill appearing.  Minimal eye contact.  Minimal psychomotor activity.  Speech decreased in amount.  Tone flat.  Affect blunted.  Denies suicidal ideation.  Alert and oriented 4.  Denies hallucinations. delirium.  Plan to discontinue the standing orders for Seroquel and Haldol.  When necessary orders for Haldol for agitation or return of delirium still in place.  We will follow.  Electronic Signatures: Jeyli Zwicker, Madie Reno (MD)  (Signed on 29-Feb-16 15:09)  Authored  Last Updated: 29-Feb-16 15:09 by Gonzella Lex (MD)

## 2014-11-20 NOTE — Outcomes Assessment (Signed)
Pt continued on IV ABX with improvement in respiratory status. Still requiring 2L oxygen and wheezing between svn treatments. FBS has required no sliding scale coverage this shift. Only requested pain medication x 1 so far this shift.

## 2014-11-20 NOTE — Discharge Summary (Signed)
Dates of Admission and Diagnosis:  Date of Admission 14-Sep-2014   Date of Discharge 20-Sep-2014   Admitting Diagnosis SOB   Final Diagnosis 1. Acute respiratory failure 2. healthcare associated Pneumonia 3. chronic obstructive pulmonary disease exacerbation 4. Elevated troponin due to demand ischemia 5. Acute toxic metabolic encephalopathy 6. Anxiety 7. Hypokalemia 8. Anemia of Chronic Disease    Chief Complaint/History of Present Illness PRIMARY CARE PHYSICIAN:  Kirstie Peri. Fisher, MD  CHIEF COMPLAINT:  Cough and altered mental status.   HISTORY OF PRESENT ILLNESS:  This is a 64 year old Caucasian female with a history of COPD, chronic respiratory failure, 2 liters nasal cannula at baseline, presenting with altered mental status and cough. Altered mental status described last as confusion by husband at bedside, who states that she was seeing children and people running around the house who were not actually present as well as worsening cough now productive of greenish sputum and shortness of breath; however, this is close to her baseline. She saw her PCP for the above symptoms, who recommended Emergency Department evaluation. Of note, she was recently discharged from [facility] on 08/31/2014, with discharge diagnosis of COPD exacerbation. The patient herself is unable to provide any further meaningful information given mental status at this time.   Allergies:  Sulfa drugs: Unknown  EES: Unknown  Aspirin: Unknown  Flagyl: Unknown  Avelox: Unknown  Latex: Unknown  tetracyclines: Rash  Shellfish: N/V/Diarrhea, Dizzy/Fainting  Meloxicam: Unknown  topiramate: Unknown  Codeine: Unknown  Cephalexin: Unknown  Pertinent Past History:  Pertinent Past History PAST MEDICAL HISTORY:  Includes COPD, chronic respiratory failure, 2 liters nasal cannula at baseline, fibromyalgia, chronic pain, and gastroesophageal reflux disease without esophagitis.   Hospital Course:  Hospital  Course 1.  Healthcare-associated pneumonia with acute respiratory failure  supplemental O2 to keep SaO2 > 88%. DuoNeb , Incentive spirometry. Chanaged to levaquin. Will need total 10 days abx.  Continue with her home medications including Symbicort and Dulera.  2.  Demand ischemia With her acute illness demand ischemia more likely. Discussed with Dr. Ubaldo Glassing. Will need further w/u with likely a functional study but after recovery from her acute pneumonia. Can be done as OP.  3.  Hypokalemia.  replace, potassium goal 4 to 5.   4.  Encephalopathy, likely related to infection and #1. ABG is within normal limits.       component of severe anxiety present- xanax. appreciate psych help.  6. anxiety episodes- xanax  Time spent on discharge 40 min   Condition on Discharge Fair   Code Status:  Code Status Full Code   PHYSICAL EXAM ON DISCHARGE:  Physical Exam:  GEN no acute distress   HEENT pale conjunctivae   RESP normal resp effort  clear BS   CARD regular rate  No LE edema   PSYCH alert   VITAL SIGNS:  Vital Signs: **Vital Signs.:   01-Mar-16 04:19  Vital Signs Type Routine  Temperature Temperature (F) 98  Celsius 36.6  Temperature Source axillary  Pulse Pulse 84  Respirations Respirations 18  Systolic BP Systolic BP 329  Diastolic BP (mmHg) Diastolic BP (mmHg) 66  Mean BP 77  Pulse Ox % Pulse Ox % 97  Pulse Ox Activity Level  At rest  Oxygen Delivery 2L   DISCHARGE INSTRUCTIONS HOME MEDS:  Medication Reconciliation: Patient's Home Medications at Discharge:     Medication Instructions  proair hfa cfc free 90 mcg/inh inhalation aerosol  2 puff(s) inhaled 4 times a day, As Needed -  for Shortness of Breath   zyrtec-d 5 mg-120 mg oral tablet, extended release  1 tab(s) orally 2 times a day, As Needed for allergies.    duloxetine 30 mg oral delayed release capsule  1 cap(s) orally once a day (in the morning)   symbicort 160 mcg-4.5 mcg/inh inhalation aerosol  1  puff(s) inhaled 2 times a day   dulera 5 mcg-200 mcg/inh inhalation aerosol  2 puff(s) inhaled 2 times a day   morphine 15 mg oral tablet  0.5 tab(s) orally every 4 hours, As Needed - for Pain   alprazolam 0.5 mg oral tablet  0.5 tab(s) orally 2 times a day, As Needed - for Anxiety, Nervousness   levofloxacin 750 mg oral tablet  1 tab(s) orally once a day - 4 days   atorvastatin 40 mg oral tablet  1 tab(s) orally once a day (at bedtime)   tiotropium 18 mcg inhalation capsule  1 cap(s) inhaled once a day   guaifenesin 600 mg oral tablet, extended release  1 tab(s) orally 2 times a day   potassium chloride 20 meq oral powder for reconstitution  1 packet(s) orally once a day   docusate sodium 100 mg oral capsule  1 cap(s) orally 2 times a day   acetaminophen 325 mg oral tablet  2 tab(s) orally every 4 hours, As needed, mild pain (1-3/10) or temp. greater than 100.4    STOP TAKING THE FOLLOWING MEDICATION(S):    aleve sodium 220 mg oral tablet: 2 tab(s) orally every 8 hours, As Needed - for Pain  Physician's Instructions:  Diet Low Sodium   Activity Limitations As tolerated   Return to Work Not Applicable   Time frame for Follow Up Appointment 1-2 days  Physician at rehab   Time frame for Follow Up Appointment 1-2 weeks  Dr. Ubaldo Glassing   Electronic Signatures: Darvin Neighbours, Lottie Dawson (MD)  (Signed 01-Mar-16 09:58)  Authored: ADMISSION DATE AND DIAGNOSIS, CHIEF COMPLAINT/HPI, Allergies, PERTINENT PAST HISTORY, HOSPITAL COURSE, PHYSICAL EXAM ON DISCHARGE, VITAL SIGNS, DISCHARGE INSTRUCTIONS HOME MEDS, PATIENT INSTRUCTIONS   Last Updated: 01-Mar-16 09:58 by Alba Destine (MD)

## 2014-11-20 NOTE — Progress Notes (Signed)
ANTIBIOTIC CONSULT NOTE - FOLLOW UP  Pharmacy Consult for Meropenem Indication: HCAP  Allergies  Allergen Reactions  . Asa [Aspirin]   . Avelox [Moxifloxacin]   . Cephalexin   . Codeine   . Erythromycin Ethylsuccinate   . Flagyl [Metronidazole]   . Latex   . Levaquin [Levofloxacin] Hives  . Meloxicam   . Shellfish Allergy Nausea And Vomiting and Other (See Comments)    Dizzy, fainting  . Sulfa Antibiotics   . Topiramate   . Tetracyclines & Related Rash    Patient Measurements: Height: 5' (152.4 cm) Weight: 120 lb 3.2 oz (54.522 kg) IBW/kg (Calculated) : 45.5   Vital Signs: Temp: 98 F (36.7 C) (05/01 1107) Temp Source: Oral (05/01 1107) BP: 94/52 mmHg (05/01 1107) Pulse Rate: 79 (05/01 1107)  Labs:  Recent Labs  11/18/14 1045  CREATININE 0.49   Estimated Creatinine Clearance: 51 mL/min (by C-G formula based on Cr of 0.49).   Microbiology: Culture, blood (single)     Status: None   Collection Time: 11/10/14  2:15 PM  Result Value Ref Range Status   Micro Text Report   Final       COMMENT                   NO GROWTH AEROBICALLY/ANAEROBICALLY IN 5 DAYS   ANTIBIOTIC                                                      Culture, blood (single)     Status: None   Collection Time: 11/10/14  2:15 PM  Result Value Ref Range Status   Micro Text Report   Final       COMMENT                   NO GROWTH AEROBICALLY/ANAEROBICALLY IN 5 DAYS   ANTIBIOTIC                                                      Culture, expectorated sputum-assessment     Status: None   Collection Time: 11/11/14  6:00 PM  Result Value Ref Range Status   Micro Text Report   Final       SOURCE: ENDOTRACHEAL SUCTION    COMMENT                   APPEARS TO BE NORMAL FLORA AT 48 HOURS   GRAM STAIN                MODERATE WHITE BLOOD CELLS   GRAM STAIN                NO ORGANISMS SEEN   GRAM STAIN                GOOD SPECIMEN-80-90% WBC   ANTIBIOTIC                                         Bronchial Wash Culture     Status: None   Collection Time: 11/14/14  4:30 AM  Result  Value Ref Range Status   Micro Text Report   Final       COMMENT                   APPEARS TO BE NORMAL FLORA AT 48 HOURS   GRAM STAIN                FEW WHITE BLOOD CELLS   GRAM STAIN                MODERATE GRAM POSITIVE COCCI IN PAIRS   GRAM STAIN                RARE GRAM NEGATIVE ROD   ANTIBIOTIC                                                        Anti-infectives    Start     Dose/Rate Route Frequency Ordered Stop   11/20/14 0200  meropenem (MERREM) 1 g in sodium chloride 0.9 % 50 mL IVPB     1 g 100 mL/hr over 30 Minutes Intravenous 3 times per day 11/19/14 1125 11/20/14 2359      Assessment: No growth in Bronchial wash in 48 hours.  WBC of 18 on 4/28.  Goal of Therapy:  Resolution of infection.    Plan:  Continue current orders for Meropenem 1 gm IV q8h based on renal function.  Pharmacy will continue to follow.  Dejanique Ruehl G 11/20/2014,2:37 PM

## 2014-11-20 NOTE — H&P (Signed)
PATIENT NAME:  Tiffany Mclean, Tiffany Mclean MR#:  [id number] DATE OF BIRTH:  08/17/1950  DATE OF ADMISSION:  09/14/2014  REFERRING PHYSICIAN:  Verdia Kuba. Paduchowski, MD  PRIMARY CARE PHYSICIAN:  Kirstie Peri. Fisher, MD  CHIEF COMPLAINT:  Cough and altered mental status.   HISTORY OF PRESENT ILLNESS:  This is a 64 year old Caucasian female with a history of COPD, chronic respiratory failure, 2 liters nasal cannula at baseline, presenting with altered mental status and cough. Altered mental status described last as confusion by husband at bedside, who states that she was seeing children and people running around the house who were not actually present as well as worsening cough now productive of greenish sputum and shortness of breath; however, this is close to her baseline. She saw her PCP for the above symptoms, who recommended Emergency Department evaluation. Of note, she was recently discharged from [facility] on 08/31/2014, with discharge diagnosis of COPD exacerbation. The patient herself is unable to provide any further meaningful information given mental status at this time.   REVIEW OF SYSTEMS:  Unobtainable given the patient's mental status and medical condition.   PAST MEDICAL HISTORY:  Includes COPD, chronic respiratory failure, 2 liters nasal cannula at baseline, fibromyalgia, chronic pain, and gastroesophageal reflux disease without esophagitis.   SOCIAL HISTORY:  Remote tobacco use. Wheelchair for ambulation. No alcohol use.   FAMILY HISTORY:  Positive for hypertension.   ALLERGIES:  ASPIRIN, AVELOX, CEPHALEXIN, CODEINE, FLAGYL, MELOXICAM, SULFA DRUGS, TETRACYCLINE  HOME MEDICATIONS:  Include Aleve 220 mg 2 tabs p.o. q. 8 hours as needed for pain, morphine 50 mg p.o. q. 4 hours as needed for pain, duloxetine 30 mg p.o. daily, alprazolam 0.5 mg 1/2 tablet b.i.d. as needed for anxiety, Dulera 5/200 mcg inhalation 2 puffs b.i.d., ProAir 90 mcg inhalation 2 puffs 4 times a day as needed for  shortness of breath, Symbicort 160/4.5 mcg inhalation 1 puff b.i.d., Zyrtec 5/120 mg p.o. b.i.d. as needed.   PHYSICAL EXAMINATION: VITAL SIGNS:  Temperature 98.3, heart rate 113, respirations 22, blood pressure 103/68, and saturating 92% on supplemental O2. Weight 47.6 kg, BMI 18.  GENERAL:  Chronically-ill and frail-appearing Caucasian female currently in minimal distress given mental status.  HEAD:  Normocephalic, atraumatic.  EYES:  Pupils are equal and reactive to light. Extraocular muscles are intact. No scleral icterus.  MOUTH:  Dry mucosal membranes. Dentition is intact. No abscess noted. EARS, NOSE, AND THROAT:  Clear without exudates. No external lesions.  NECK:  Supple. No thyromegaly. No adenopathy. No JVD.  PULMONARY:  Diminished breath sounds throughout all lung fields secondary to poor respiratory effort with right lower lobe coarse rhonchi. No wheezing noted at this time. Poor respiratory effort as stated above. No use of accessory muscles.  CHEST:  Nontender to palpation.  CARDIOVASCULAR:  S1 and S2, regular rate and rhythm. No murmurs, rubs, or gallops. No edema. Pedal pulses 2+ bilaterally.  GASTROINTESTINAL:  Soft, nontender, nondistended. No masses. Positive bowel sounds. No hepatosplenomegaly.  MUSCULOSKELETAL: No swelling, clubbing, or edema. Range of motion is full in all extremities.  NEUROLOGIC:  Cranial nerves II through XII are intact. No gross focal neurological deficits. Sensation is intact. Reflexes are intact.  SKIN:  No ulceration, lesion, rash, or cyanosis. Skin is warm and dry. Turgor is intact.  PSYCHIATRIC:  Mood and affect are flattened. She is awake, alert, and oriented to person and place. She has difficulty answering questions; hence, her responses are remarkably slowed. Insight and judgment appear poor at this  time.   LABORATORY DATA:  Sodium is 130, potassium 3.2, chloride 92, bicarbonate 40, BUN 15, creatinine 1.11, and glucose is 111. Troponin is 1.2.  WBC is 9.5, hemoglobin 12.9, and platelets are 258,000. ABG performed:  PH of 7.42, CO2 of 68, O2 of 58, and bicarbonate of 44; this was all nasal cannula.   Chest x-ray performed, which reveals right middle lobe and possibly right upper lobe pneumonia, as infiltrate has increased from prior imaging.   ASSESSMENT AND PLAN:  This is a 64 year old Caucasian female with a history of chronic obstructive pulmonary disease, chronic respiratory failure, 2 liters nasal cannula at baseline, who was recently discharged from the hospital on 08/31/2014, with discharge diagnosis of chronic obstructive pulmonary disease exacerbation and treated for pneumonia at that time as well, presenting with altered mental status and cough.   1.  Healthcare-associated pneumonia. Provide supplemental O2 to keep SaO2 greater than 88%. DuoNeb treatments. Incentive spirometry. Antibiotic coverage with vancomycin, Levaquin, and meropenem given her multitude of allergies, as well as recent hospitalization. Culture data including blood culture, sputum culture, and attempt to get a urine sample. Taper antibiotics when culture data returns. Continue with her home medications including Symbicort and Dulera. I will also add steroids, Solu-Medrol 60 mg IV daily.  2.  Non-ST segment elevation myocardial infarction initial visit. She does have a history of ASPIRIN ALLERGY. We will initiate statin therapy, start heparin drip, place on telemetry, and trend cardiac enzymes x 3.  3.  Hypokalemia. We will replace, potassium goal 4 to 5.  4.  Encephalopathy, likely related to infection and #1. ABG is within normal limits. Follow mental status. Avoid further sedating agents as possible.  5.  Venous thromboembolism prophylaxis with heparin drip.   CODE STATUS:  The patient is full code as discussed with the husband at bedside.   TIME SPENT:  45 minutes.    ____________________________ Aaron Mose. Nataniel Gasper, MD dkh:nb D: 09/14/2014 23:05:04  ET T: 09/14/2014 23:26:52 ET JOB#: [id number]  cc: Aaron Mose. Calleen Alvis, MD, <Dictator> Shakeria Robinette Woodfin Ganja MD ELECTRONICALLY SIGNED 09/15/2014 3:15

## 2014-11-20 NOTE — H&P (Addendum)
PATIENT NAME:  Tiffany Mclean, Tiffany Mclean MR#:  [id number] DATE OF BIRTH:  [birth year]  DATE OF ADMISSION:  11/10/2014  ADMITTING PHYSICIAN: Larae Grooms, MD   PRIMARY CARE PHYSICIAN: Kirstie Peri. Caryn Section, MD   HISTORY OF PRESENT ILLNESS: The patient is a 64 year old Caucasian female with past medical history significant for history of recent 2 admissions to the Emergency Room for acute respiratory failure, one.at the end of February and beginning of March 2016, for which she was treated for acute respiratory failure and healthcare-associated pneumonia, and the other admission at the beginning of April 2016; this admission was only to the Emergency Room; however, the patient was transferred to outside facility for severe sepsis. She comes back to the hospital to the Emergency Room today, on 11/10/2014, with severe shortness of breath tachypnea. According to triage note, the patient arrived via EMS with difficulty breathing, and she came in on CPAP. Her initial oxygen saturations were 70s. She was given nebulizers and tried on BiPAP for 25 minutes; however, she was not moving air, despite oral therapy in the hospital, and she was intubated. Now, she is being sedated and not able to answer my questions. On arrival to the Emergency Room, she was febrile with temperature of 102. Her respiration rate was about 30s; and as mentioned above, her oxygen saturation was in the 70s. The patient did have a chest x-ray done in the Emergency Room, which revealed a right mid lung opacity. Hospital services were contacted for admission.   PAST MEDICAL HISTORY: Significant for: History of admission for healthcare-associated pneumonia in March 2016, as well as April 2016. April admission to the Emergency Room was transferred to outside facility for acute respiratory failure and severe sepsis according to the medical records. History of chronic respiratory failure on 2 liters of oxygen through nasal cannula at baseline, history of  fibromyalgia, COPD, chronic pain syndrome, gastroesophageal reflux disease without esophagitis, elevated troponin due to demand ischemia in February 2016 with acute toxic metabolic encephalopathy, anxiety, hypovolemia, anemia of chronic disease.   SOCIAL HISTORY: Remote tobacco abuse, according to the medical records. The patient is using a wheelchair. No alcohol abuse.   FAMILY HISTORY: Significant for hypertension.   ALLERGIES: ASPIRIN, AVELOX, KEFLEX, CODEINE, FLAGYL, MELOXICAM, SULFA DRUGS, AS WELL AS TETRACYCLINE, ACCORDING TO THE MEDICAL RECORDS.   MEDICATION LIST: Is as follows: The patient unfortunately her medication review is not done here in the Emergency Room, yet, so medication list is as of 09/20/2014: Tylenol 325 mg 2 capsules every 4 hours as needed, DuoNeb every 4 hours, alprazolam 0.25 mg every 8 hours as needed, atorvastatin 40 mg daily, duloxetine 30 mg daily, fluticasone nasal spray 2 sprays to each nostril at bedtime, guaifenesin 300 mg twice daily, morphine 50 mg tablets 1/2 tablet every 4 hours as needed, potassium chloride 20 mEq  once daily, ProAir HFA as needed, senna 8.6 mg daily as needed, Symbicort 160/4.5 two puffs twice daily, Zyrtec 5/120 mg twice daily as needed.   REVIEW OF SYSTEMS: As mentioned above, is not available.   PHYSICAL EXAMINATION: VITAL SIGNS: On arrival to the Emergency Room: Temperature was 102.2, pulse was 115, respiration was 20, blood pressure 128/112, saturation was 70% on room air and remains at 70% on CPAP, as well. Did improve to 80s on CPAP as well as BiPAP, however, then deteriorated again.  GENERAL: This is a well-developed, thin Caucasian female in moderate distress. Is still not sedated completely, moving her upper extremities, trying to catch her ET tube.  HEENT: Her pupils equal, 4 mm, reactive to light. Extraocular muscles intact. No icterus, conjunctivitis. I was not able to assess her hearing, as she is obtunded. No pharyngeal  erythema. Mucosa is moist. ET tube is in place.  NECK: No masses. Supple, nontender. Thyroid is not enlarged  No adenopathy. No JVD or carotid bruits bilaterally, full range of motion.  LUNGS: Clear to auscultation. Mildly diminished breath sounds, bilaterally. Rhonchi and rales were noted, but no wheezing was heard. The patient does have labored inspirations, as well as increased effort to breathe. She is in moderate to severe respiratory distress. The patient is being ventilated.  CARDIOVASCULAR: S1, S2 appreciated. Rate and rhythm are regular. PMI not lateralized. Chest is nontender to palpation. 1+ pedal pulses.  EXTREMITIES: No lower extremity edema, calf tenderness, or cyanosis was noted.,  ABDOMEN: Moderately firm and nontender. Bowel sounds are present. No hepatosplenomegaly or masses were noted.  RECTAL: Deferred.  NEUROLOGICAL: Muscle strength: Able to move all extremities. No cyanosis, degenerative joint disease or kyphosis. Gait was not tested.  SKIN: Did not reveal any rashes, lesions, erythema, nodularity, or induration. It was warm and dry to palpation.  LYMPHATIC: No adenopathy in the cervical region.  NEUROLOGICAL: Cranial nerves grossly intact. Sensory difficult to obtain, since the patient is intubated and half sedated, half agitated on the ventilator. I am not able to assess her memory. I am not able to assess for confusion. No obvious depression signs were noted.   LABORATORY DATA: BMP showed a glucose of 203. Beta-type natriuretic peptide was 455, otherwise the BMP was unremarkable. The patient's sodium level was low at 21, albumin level of 2.9, AST elevation to 46. Cardiac enzymes were negative. White blood cell count is elevated to 33.7 thousand, hemoglobin level was 10.7, platelet count was 487,000. Coagulation panel: ProTime 15.1, INR 1.2. ABGs were performed on 100% FiO2, showed pH of 7.27, pCO2 of 44 pO2 of 198, saturation was 100% on the ventilator.   RADIOLOGIC STUDIES:  Chest x-ray, portable single view, 11/10/2014, revealed COPD changes with increased consolidation of the right middle lobe. Radiographic follow up until resolution was recommended to exclude underlying abnormalities.   ASSESSMENT AND PLAN: 1. Acute on chronic respiratory failure. Admit the patient to the medical floor intensive care unit. Started on Solu-Medrol, nebulizers, inhalers, as well as antibiotic therapy with vancomycin and meropenem. Get sputum cultures and continue oxygen therapy. Get pulmonary involved for further recommendations. We will keep the patient's oxygen saturations at 92% to 94%.  2. Healthcare-associated pneumonia. As above, we will continue vancomycin as well as meropenem, getting sputum cultures, and adjust antibiotics according to culture results.  3. Leukocytosis. We will follow with antibiotic therapy.  4. Mild coagulopathy. We will watch the patient's ProTime. She has no bleeding at this time.  5. Hyperglycemia. Get hemoglobin A1c to rule out diabetes.  6. Anemia. Get guaiac.  TIME SPENT: One hour on dictation.  ____________________________ Theodoro Grist, MD rv:mw D: 11/10/2014 15:49:37 ET T: 11/10/2014 16:51:24 ET JOB#: [id number]  cc: Theodoro Grist, MD, <Dictator> Kirstie Peri. Caryn Section, MD Theodoro Grist MD ELECTRONICALLY SIGNED 11/19/2014 20:51

## 2014-11-20 NOTE — Progress Notes (Signed)
Vitals remained stable this shift.  Pain relieved with ordered pain medication.  Pt turned q2h and encouraged to cough/deep breath.

## 2014-11-20 NOTE — Progress Notes (Signed)
Subjective;feels better.                                                                                                                                 Patient Demographics  Tiffany Mclean, is a 64 y.o. female, DOB - [birth year], NWG:[id number]  Admit date - 11/10/2014   Admitting Physician Theodoro Grist, MD  Outpatient Primary MD for the patient is No primary care provider on file.  LOS - 10   No chief complaint on file.  Respiratory failure Pneumonia Anxiety Possible malignancy    Subjective:   Normand Sloop today seen,resting well.denies any Sob.          Consults PUlmonary    Medications  Scheduled Meds: . ALPRAZolam  0.25 mg Oral 3 times per day  . atorvastatin  40 mg Oral Daily  . budesonide (PULMICORT) nebulizer solution  0.5 mg Nebulization Q12H  . budesonide-formoterol  2 puff Inhalation BID  . DULoxetine  90 mg Oral Q24H  . famotidine  20 mg Oral Q24H  . feeding supplement (ENSURE ENLIVE)  237 mL Oral BID AC  . fluticasone  2 spray Each Nare Daily  . guaiFENesin  600 mg Oral BID  . heparin subcutaneous  5,000 Units Subcutaneous 3 times per day  . insulin aspart  0-12 Units Subcutaneous 6 times per day  . ipratropium-albuterol  3 mL Nebulization Q4H  . meropenem (MERREM) IV  1 g Intravenous 3 times per day  . potassium chloride  20 mEq Oral Daily  . RABEprazole  20 mg Oral QAC breakfast  . sodium chloride  5 mL Intravenous Daily   Continuous Infusions:  PRN Meds:.acetaminophen, LORazepam, morphine, ondansetron (ZOFRAN) IV, sennosides, sodium chloride  DVT Prophylaxis  SCD  Lab Results  Component Value Date   PLT 511* 11/17/2014    Antibiotics   Anti-infectives    Start     Dose/Rate Route Frequency Ordered Stop   11/20/14 0200  meropenem (MERREM) 1 g in sodium chloride 0.9 % 50 mL IVPB     1 g 100 mL/hr over 30 Minutes Intravenous 3 times per day 11/19/14 1125 11/20/14 2359          Objective:   Filed Vitals:   11/20/14  0431 11/20/14 0801 11/20/14 0839 11/20/14 1107  BP:  92/45  94/52  Pulse:  73 84 79  Temp:  98.1 F (36.7 C)  98 F (36.7 C)  TempSrc:  Oral  Oral  Resp:  20 20 22   Height:      Weight:      SpO2: 98% 97%  95%    Wt Readings from Last 3 Encounters:  11/20/14 54.522 kg (120 lb 3.2 oz)     Intake/Output Summary (Last 24 hours) at 11/20/14 1313 Last data filed at 11/20/14 0900  Gross per 24 hour  Intake    340 ml  Output      0 ml  Net  340 ml     Physical Exam  Awake Alert, Oriented X 3, No new F.N deficits, Normal affect [place].AT,PERRAL Supple Neck,No JVD, No cervical lymphadenopathy appriciated.  Symmetrical Chest wall movement, Good air movement bilaterally, CTAB RRR,No Gallops,Rubs or new Murmurs, No Parasternal Heave +ve B.Sounds, Abd Soft, No tenderness, No organomegaly appriciated, No rebound - guarding or rigidity. No Cyanosis, Clubbing or edema, No new Rash or bruise   Data Review   Micro Results Recent Results (from the past 240 hour(s))  Culture, blood (single)     Status: None   Collection Time: 11/10/14  2:15 PM  Result Value Ref Range Status   Micro Text Report   Final       COMMENT                   NO GROWTH AEROBICALLY/ANAEROBICALLY IN 5 DAYS   ANTIBIOTIC                                                      Culture, blood (single)     Status: None   Collection Time: 11/10/14  2:15 PM  Result Value Ref Range Status   Micro Text Report   Final       COMMENT                   NO GROWTH AEROBICALLY/ANAEROBICALLY IN 5 DAYS   ANTIBIOTIC                                                      Culture, expectorated sputum-assessment     Status: None   Collection Time: 11/11/14  6:00 PM  Result Value Ref Range Status   Micro Text Report   Final       SOURCE: ENDOTRACHEAL SUCTION    COMMENT                   APPEARS TO BE NORMAL FLORA AT 48 HOURS   GRAM STAIN                MODERATE WHITE BLOOD CELLS   GRAM STAIN                NO ORGANISMS  SEEN   GRAM STAIN                GOOD SPECIMEN-80-90% WBC   ANTIBIOTIC                                                      Bronchial Wash Culture     Status: None   Collection Time: 11/14/14  4:30 AM  Result Value Ref Range Status   Micro Text Report   Final       COMMENT                   APPEARS TO BE NORMAL FLORA AT [place]  FEW WHITE BLOOD CELLS   GRAM STAIN                MODERATE GRAM POSITIVE COCCI IN PAIRS   GRAM STAIN                RARE GRAM NEGATIVE ROD   ANTIBIOTIC                                                        Radiology Reports Ct Chest W Contrast  11/11/2014   CLINICAL DATA:  Evaluate for possible lung mass. History of pneumonia.  EXAM: CT CHEST WITH CONTRAST  TECHNIQUE: Multidetector CT imaging of the chest was performed during intravenous contrast administration.  CONTRAST:  75 cc Omnipaque 300  COMPARISON:  Chest CT 08/29/2014  FINDINGS: Mediastinum/Nodes: Endotracheal tube terminates in the mid trachea. Enteric tube courses inferior to the diaphragm, tip projects in the stomach. No enlarged axillary, mediastinal or hilar lymphadenopathy. Heart is normal in size. No pericardial effusion. There is rightward shift of the mediastinum.  Lungs/Pleura: Suggestion of an intraluminal lesion within the right middle lobe bronchi (image 36; series 3). When compared to prior examination (CT 08/29/2014) there has been near complete total atelectasis of the right middle lobe. Interval development of extensive predominantly peripheral irregular masslike pulmonary consolidation within the right lower lobe. No significant interval change in subpleural nodularity involving the right upper lobe. Small right pleural effusion.  Upper abdomen: No consolidative or nodular pulmonary opacities. Adrenal glands are unremarkable. No aggressive or acute appearing osseous lesions.  Musculoskeletal: No aggressive or acute appearing osseous lesions. Bilateral breast  implants.  IMPRESSION: 1. Near complete atelectasis/consolidation of the right middle lobe. There is a filling defect within the right middle lobe bronchus, likely secondary to mucous plugging. Mass is not entirely excluded. This may be partially responsible for the right middle lobe atelectasis. 2. Extensive predominantly subpleural masslike consolidation within the right lower lobe is nonspecific however may be infectious/inflammatory etiology as this was not present on prior CT 08/29/2014. Recommend short-term followup chest CT in 3-4 weeks after completion of appropriate antibiotic therapy to evaluate for interval improvement and exclude the possibility of underlying mass/neoplasm.   Electronically Signed   By: Lovey Newcomer M.D.   On: 11/11/2014 16:26   Dg Chest Port 1 View  11/14/2014   CLINICAL DATA:  Respiratory failure.  EXAM: PORTABLE CHEST - 1 VIEW  COMPARISON:  CT 11/11/2014.  Chest x-ray 11/11/2014.  FINDINGS: Endotracheal tube, NG tube, left IJ line in stable position. Heart size stable. Pulmonary vascularity is normal. Persistent dense atelectasis and/or infiltrate right lung noted. Persistent right pleural effusion noted. No pneumothorax.  IMPRESSION: 1. Lines and tubes in stable position. 2. Persistent dense right lower lobe atelectasis and/or infiltrate with persistent small right pleural effusion.   Electronically Signed   By: [name]  Register   On: 11/14/2014 07:43   Dg Chest Port 1 View  11/11/2014   CLINICAL DATA:  Central line placement.  EXAM: PORTABLE CHEST - 1 VIEW  COMPARISON:  11/10/2014  FINDINGS: Endotracheal tube tip projects 3.9 cm proximal to the carina. Left IJ catheter tip projects over the distal SVC. NG tube descends below the level of the image. Other wires and tubes are presumably external. Breast prostheses obscure detailed lung evaluation. Allowing for this, no  overt pneumothorax or consolidation on the left. Persistent right middle lobe consolidation is similar.  Cardiomediastinal contours are partially obscured. No interval osseous change.  IMPRESSION: Left IJ catheter tip projects over the distal SVC.  No pneumothorax.  Unchanged endotracheal and NG tubes.  Persistent right middle lobe consolidation.   Electronically Signed   By: Carlos Levering M.D.   On: 11/11/2014 01:38   Dg Chest Port 1 View  11/10/2014   CLINICAL DATA:  Post intubation  EXAM: PORTABLE CHEST - 1 VIEW  COMPARISON:  Prior film same day  FINDINGS: Cardiomediastinal silhouette is stable. Hyperinflation again noted. Persistent consolidation in right middle lobe. Endotracheal tube in place with tip 3.6 cm above the carina. No pneumothorax.  IMPRESSION: Hyperinflation again noted. Persistent consolidation in right middle lobe. Endotracheal tube in place with tip 3.6 cm above the carina. No pneumothorax.   Electronically Signed   By: Lahoma Crocker M.D.   On: 11/10/2014 16:11   Dg Chest Port 1 View  11/10/2014   CLINICAL DATA:  Pneumonia, shortness of breath beginning today  EXAM: PORTABLE CHEST - 1 VIEW  COMPARISON:  Portable exam 1422 hours compared to 10/23/2014  FINDINGS: Normal heart size, mediastinal contours and pulmonary vascularity.  Emphysematous changes compatible with COPD.  Increased consolidation RIGHT middle lobe.  Mild volume loss RIGHT chest with mediastinal shift to RIGHT.  Remaining lungs clear.  No pleural effusion or pneumothorax.  Bones demineralized with evidence of prior cervical spine fusion.  BILATERAL breast prostheses.  IMPRESSION: COPD changes with increased consolidation in RIGHT middle lobe; radiographic followup until resolution recommended to exclude underlying abnormalities.   Electronically Signed   By: Lavonia Dana M.D.   On: 11/10/2014 15:04     CBC  Recent Labs Lab 11/14/14 0817 11/15/14 0331 11/16/14 0410 11/17/14 0409  WBC 12.4* 12.2* 18.7* 18.0*  HGB 8.3* 8.2* 9.0* 9.1*  HCT 26.6* 26.0* 29.1* 29.0*  PLT 403 400 487* 511*  MCV 87 86 86 85  MCH 27.0 26.9  26.5 26.7  MCHC 31.0* 31.4* 30.9* 31.4*  RDW 17.2* 17.2* 17.3* 17.8*    Chemistries   Recent Labs Lab 11/14/14 0817 11/15/14 0331 11/16/14 0410 11/17/14 0409 11/18/14 1045  CO2 31 32 34* 38* 36*  BUN 35* 33* 33* 20 12  CREATININE 0.60 0.50 0.43* 0.44 0.49  CALCIUM 9.2 9.1 8.9 8.8* 8.9   ------------------------------------------------------------------------------------------------------------------ estimated creatinine clearance is 51 mL/min (by C-G formula based on Cr of 0.49). ------------------------------------------------------------------------------------------------------------------ No results for input(s): HGBA1C in the last 72 hours. ------------------------------------------------------------------------------------------------------------------ No results for input(s): CHOL, HDL, LDLCALC, TRIG, CHOLHDL, LDLDIRECT in the last 72 hours. ------------------------------------------------------------------------------------------------------------------ No results for input(s): TSH, T4TOTAL, T3FREE, THYROIDAB in the last 72 hours.  Invalid input(s): FREET3 ------------------------------------------------------------------------------------------------------------------ No results for input(s): VITAMINB12, FOLATE, FERRITIN, TIBC, IRON, RETICCTPCT in the last 72 hours.  Coagulation profile No results for input(s): INR, PROTIME in the last 168 hours.  No results for input(s): DDIMER in the last 72 hours.  Cardiac Enzymes No results for input(s): CKMB, TROPONINI, MYOGLOBIN in the last 168 hours.  Invalid input(s): CK ------------------------------------------------------------------------------------------------------------------ Invalid input(s): POCBNP  Assessment & Plan    Active Problems: Pneumonia Assesment and plan; 1.acute respiratory failure;resolved 2.pneumonia;on cefipime 3.hypotension;asymptomatic 4.depression/anxiety 5.recurrent pneumonia;negative  bronch and biopsy;needs PET  For further eval 6.deconditiong;PT eval   Code Status: full code  Family Communication: d/w wife  Disposition Plan: pending pT eval   Time Spent in minutes   80 min   Ziyah Cordoba M.D on 11/20/2014 at  1:13 PM      O

## 2014-11-20 NOTE — Consult Note (Signed)
Present Illness 64 year old female with history of multiple medical problems including chronic pain syndrome, oxygen-dependent COPD, fibromyalgia, chronic GI complaints who was admitted with some confusion and a cough productive of greenish sputum.  Patient is an extremely difficult historian.  Her husband is much of the history.  Apparently the patient has been coughing up green sputum  tinged with blood for several days.  She complains of pain an all of her extremeties.  She was brought to the emergency room by her husband where she was noted be moderately hypoxia.  Chest x-ray revealed evidence of probable pneumonia in her right middle lobe and possibly right lower lobe.  She denies any chest pain both at rest and with exertion.  As part of her admission panel, serum troponin was drawn and was noted to be mildly abnormal at 1.3.  The electrocardiogram revealed sinus rhythm with nonspecific ST T wave changes.  She denies any prior cardiac history.  It is of note that she was recently admitted to [facility] with respiratory failure and discharge.  Her serum troponin at that time was normal.  She was also noted to have acute on chronic renal insufficiency.  Her GFR dropped from greater than  sixty down to 54. she currently is in sinus rhythm.  She continues to complain of generalize pain but not chest pain.  She complains or shortness of breath and continues to have a frequent call which was nonproductive.  She is currently afebrile   Physical Exam:  GEN somewhat anxious and shaky   HEENT PERRL, hearing intact to voice   NECK supple   RESP normal resp effort  no use of accessory muscles  rhonchi   CARD Regular rate and rhythm  Murmur   Murmur Systolic   Systolic Murmur axilla   ABD denies tenderness  normal BS  no Abdominal Bruits   LYMPH negative neck   EXTR negative cyanosis/clubbing, positive edema   SKIN normal to palpation   NEURO cranial nerves intact,  motor/sensory function intact   PSYCH poor insight, anxious   Review of Systems:  Subjective/Chief Complaint cough productive of green sputum and generalized pain   General: Fatigue  Weakness  Trouble sleeping   Skin: No Complaints   ENT: No Complaints   Eyes: No Complaints   Neck: No Complaints   Respiratory: Frequent cough  Hemopytsis  Short of breath  Sputum   Cardiovascular: No Complaints   Gastrointestinal: Heartburn   Genitourinary: No Complaints   Vascular: No Complaints   Musculoskeletal: No Complaints   Neurologic: No Complaints   Hematologic: No Complaints   Endocrine: No Complaints   Psychiatric: Anxiety   Review of Systems: All other systems were reviewed and found to be negative   Medications/Allergies Reviewed Medications/Allergies reviewed   Family & Social History:  Family and Social History:  Family History COPD   EKG:  Interpretation sinus tachycardia with nonspecific ST T wave changes    Sulfa drugs: Unknown  EES: Unknown  Aspirin: Unknown  Flagyl: Unknown  Avelox: Unknown  Latex: Unknown  tetracyclines: Rash  Shellfish: N/V/Diarrhea, Dizzy/Fainting  Meloxicam: Unknown  topiramate: Unknown  Codeine: Unknown  Cephalexin: Unknown   Impression 64 year old female with oxygen-dependent COPD, fibromyalgia, chronic pain and benign tremor who was admitted with altered mental status, cough productive of greenish  sputum tinged was blind.  She had confusion per her husband.  She denies any chest pain.  In the emergency room chest x-ray revealed probable pneumonia.  As part of her admission battery, serum troponin was drawn which was abnormal at 1.3.  She was recently admitted to [facility] with a normal serum troponin.  She denies any chest pain.  Electrocardiogram reveals sinus tachycardia with no ischemia.  He etiology of her troponin is likely multifactorial but most likely secondary to demand ischemia.  Patient is not  currently appear to be having an acute coronary event.  Will continue to treat her pneumonia and  monitor her heart rate and symptoms.  Would recommend stopping heparin IV for now due to concerns over possible on not assess.  She does not appear to be having acute coronary event.  Would continue with beta blockers and empiric antibiotics for her pneumonia.  Will continue to follow on telemetry.  Will proceed with an echocardiogram to evaluate LV function to evaluate for wall motion abnormality to guide further therapy.  Would defer invasive evaluation pending clinical course.   Plan 1. Continue with empiric antibiotics 2. Bronchodilators 3. Will discontinue IV heparin and treat with DVT prophylactic dose of enoxaparin 4. continue with enteric-coated aspirin.  Will hold beta blockers and follow blood pressure.  Will add low-dose metoprolol tartrate as pressure tolerates 5. Echocardiogram to evaluate LV function 6. Further recommendations based on course 7. Would defer cardiac catheterization at present.   Electronic Signatures: Teodoro Spray (MD)  (Signed 25-Feb-16 14:15)  Authored: General Aspect/Present Illness, History and Physical Exam, Review of System, Family & Social History, Home Medications, EKG , Allergies, Impression/Plan   Last Updated: 25-Feb-16 14:15 by Teodoro Spray (MD)

## 2014-11-20 NOTE — Consult Note (Signed)
Psychiatry: Followup patient with agitated delirium. Like yesterday, her delirium seems to be much better. Affect still flat but she denies SI and denies Any hallucinations and is oriented to her situation and diagnosis. Delirium either from illness or steroids much improved. Will reassess tomorrow as to whether antipsychotics need to continue.  Electronic Signatures: Tyreesha Maharaj, Madie Reno (MD)  (Signed on 27-Feb-16 22:14)  Authored  Last Updated: 27-Feb-16 22:14 by Gonzella Lex (MD)

## 2014-11-20 NOTE — Consult Note (Signed)
Psychiatry: PAtient who presented yesterday with agitated and confused delirium with a psychotic-like presentation is much different today. She is sleepy but arousable. Affect flat but states mood is just tired. She is alert and oriented and not hyperverbal. Denies any SI and does not apear to be responding to internal stimuli.  delirium. Continue current medication with low dose antipsychotic. Will follow  Electronic Signatures: Sala Tague, Madie Reno (MD)  (Signed on 27-Feb-16 22:11)  Authored  Last Updated: 27-Feb-16 22:11 by Gonzella Lex (MD)

## 2014-11-20 NOTE — Op Note (Signed)
PATIENT NAME:  [name], [name] MR#:  [id number] DATE OF BIRTH:  1950/09/10  DATE OF PROCEDURE:  11/11/2014  PROCEDURE: Left IJ.   INDICATION: Sepsis, hypotension, vasoactive medications   DESCRIPTION OF PROCEDURE: After appropriate informed consent, patient prepped and draped in the usual sterile fashion. Utilizing the ultrasound, left IJ visualized and needle inserted without any complications. Nonpulsatile blood flow returned using modified Seldinger technique. Catheter placed without complications. Subsequently sutured in place. Lines flushed. Biopatch affixed. Chest x-ray ordered to assess placement. No complications.  ESTIMATED BLOOD LOSS: Minimal.    ____________________________ Aaron Mose. Hower, MD dkh:ST D: 11/11/2014 01:14:10 ET T: 11/11/2014 01:52:41 ET JOB#: [id number]  cc: Aaron Mose. Hower, MD, <Dictator> DAVID Woodfin Ganja MD ELECTRONICALLY SIGNED 11/11/2014 3:05
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
## 2014-11-20 NOTE — Plan of Care (Signed)
Pt wants to be called Tiffany Mclean. Transferred from CCU 4/29.  PMH: of PNA past two months, multiple hospital admissions. Panic attacks, depression, fibromyalgia, OA & COPD. 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 Biopsy taken.  Home meds resumed.

## 2014-11-20 NOTE — Consult Note (Signed)
Psychiatry: No new complaint. Still tired and withdrawn but still not psychotic or agitated.Alert and oriented. Tolerating and cooperating with treatment. Will leave meds alone again today to facilitate recovery  Electronic Signatures: Kelia Gibbon, Madie Reno (MD)  (Signed on 28-Feb-16 23:23)  Authored  Last Updated: 28-Feb-16 23:23 by Gonzella Lex (MD)

## 2014-11-20 NOTE — Care Management Note (Signed)
Case Management Note  Patient Details  Name: Tiffany Mclean MRN: [id number] Date of Birth: 11-Sep-1950  Subjective/Objective:                    Action/Plan:   Expected Discharge Date:  11/21/14               Expected Discharge Plan:  [place]  In-House Referral:     Discharge planning Services  CM Consult  Post Acute Care Choice:  Home Health Choice offered to:  Patient  DME Arranged:    DME Agency:     HH Arranged:  RN, PT HH Agency:  [facility]  Status of Service:  In process, will continue to follow  Medicare Important Message Given:    Date Medicare IM Given:    Medicare IM give by:    Date Additional Medicare IM Given:    Additional Medicare Important Message give by:     If discussed at [place] of Stay Meetings, dates discussed:    Additional Comments: Per previous RNCM note patient is on chronic O2 at home through [place] 11/20/2014, 11:55 AM

## 2014-11-20 NOTE — Discharge Summary (Signed)
PATIENT NAME:  Tiffany Mclean, Tiffany Mclean MR#:  [id number] DATE OF BIRTH:  May 20, 1951  DATE OF ADMISSION:  08/29/2014 DATE OF DISCHARGE:  08/31/2014  PRIMARY CARE PHYSICIAN:  Kirstie Peri. Caryn Section, MD.  CHIEF COMPLAINT: Shortness of breath.   DISCHARGE DIAGNOSES:  1. Acute on chronic hypoxic, hypercarbic respiratory failure due to chronic obstructive pulmonary disease exacerbation, improved. The patient back at baseline with 2.5 liters of nasal cannula oxygen.  2. Fibromyalgia.  3. Peripheral neuropathy.  4. Chronic pain syndrome.  5. Anxiety, depression with history of panic attacks.  6. Gastroesophageal reflux disease.    CONDITION ON DISCHARGE: Fair.   CODE STATUS: Full code.   Follow up with Dr. Caryn Section in 1 to 2 weeks.  Home health RN.   DISCHARGE MEDICATIONS: 1. Resume home oxygen 2.5 liters of nasal cannula.   2. Aciphex 20 mg p.o. daily as needed.  3. Vitamin D2 at 50,000 units once a week.  4. Alprazolam 0.5 mg 1/2 tablet b.i.d. as needed.  5. Duloxetine 60 mg p.o. daily.  6. ProAir HFA 2 puffs 4 times a day as needed.  7. Aleve 220 mg 2 tablets every 8 hours. The patient was advised to take it with food.   8. Prednisone taper.  9. Levaquin 750 mg p.o. daily.   LABORATORY DATA:  White count is 5.0, H and H is 11.8 and 38.2, platelet count is 227,000. Glucose is 151, BUN is 12, creatinine is 0.67. Sodium is 140, sodium is 141, potassium is 4.3, bicarbonate is 37.   IMAGING:  CT chest showed diffuse pulmonary emphysema with scattered fibrosis, focal patchy infiltrate in the right  lobe present, superimposed on pneumonia.   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 She was admitted with:  1. Acute hypercarbic, hypoxic  respiratory failure. She was placed on BiPAP, turned around quickly, now on 2 to 2-1/2 home oxygen which she uses at home. She was given IV Solu-Medrol,  IV antibiotics which have been changed to p.o. taper, overall improved. She is a chronic CO2 retainer.  2. Fibromyalgia. Continue Cymbalta.  3. GERD.  Continue PPI.   The patient is bedbound at home due to her hamstring weakness and contractures and fibromyalgia. She also has history of panic disorder. She will follow up with Dr. Lelon Huh as outpatient. The patient was evaluated by PT. No skilled PT evaluation was needed. We will set up a home health RN for her chronic multiple medical problems. Hospital stay otherwise remained stable.  She remained a full code.   TIME SPENT: 40 minutes.     ____________________________ Hart Rochester Posey Pronto, MD sap:by D: 08/31/2014 14:25:15 ET T: 08/31/2014 21:33:16 ET JOB#: [id number]  cc: Sage Kopera A. Posey Pronto, MD, <Dictator> Kirstie Peri. Caryn Section, MD Ilda Basset MD ELECTRONICALLY SIGNED 09/20/2014 17:28

## 2014-11-21 ENCOUNTER — Inpatient Hospital Stay: Payer: BLUE CROSS/BLUE SHIELD

## 2014-11-21 DIAGNOSIS — J9600 Acute respiratory failure, unspecified whether with hypoxia or hypercapnia: Secondary | ICD-10-CM

## 2014-11-21 LAB — GLUCOSE, CAPILLARY
GLUCOSE-CAPILLARY: 137 mg/dL — AB (ref 70–99)
GLUCOSE-CAPILLARY: 160 mg/dL — AB (ref 70–99)
GLUCOSE-CAPILLARY: 94 mg/dL (ref 70–99)
Glucose-Capillary: 103 mg/dL — ABNORMAL HIGH (ref 70–99)
Glucose-Capillary: 104 mg/dL — ABNORMAL HIGH (ref 70–99)
Glucose-Capillary: 81 mg/dL (ref 70–99)
Glucose-Capillary: 97 mg/dL (ref 70–99)
Glucose-Capillary: 99 mg/dL (ref 70–99)

## 2014-11-21 MED ORDER — SODIUM CHLORIDE 0.9 % IV SOLN
1.0000 g | Freq: Three times a day (TID) | INTRAVENOUS | Status: DC
  Administered 2014-11-21 – 2014-11-26 (×15): 1 g via INTRAVENOUS
  Filled 2014-11-21 (×20): qty 1

## 2014-11-21 MED ORDER — SODIUM CHLORIDE 0.9 % IV SOLN: 500.0000 mg | Freq: Two times a day (BID) | INTRAVENOUS | Status: DC

## 2014-11-21 MED ORDER — SODIUM CHLORIDE 0.9 % IV SOLN: 1.0000 g | Freq: Three times a day (TID) | INTRAVENOUS | Status: DC

## 2014-11-21 NOTE — Consult Note (Signed)
[facility] Consult   Reason for Consult:  Patient with reported hallucinations Referring Physician:  kasa Patient Identification: Tiffany Mclean MRN:  [id number] Principal Diagnosis: <principal problem not specified> Diagnosis:   Patient Active Problem List   Diagnosis Date Noted  . Acute respiratory failure [J96.00] 11/21/2014    Total Time spent with patient: 1 hour  Subjective:   Tiffany Mclean is a 64 y.o. female patient admitted with sepsis and acute resp failure.  HPI:  Patient readmitted with acute sepsis and required CCU treatment. Now much improved. Continues to be anxious about her health and worried that she will be discharged too early. Reports visual "floaters" in her eyes which occur after she eats. HPI Elements:   Quality:  visual hallucinations. anxiety. Severity:  moderate. Timing:  after eating. Duration:  last 2-3 days. Context:  recovery from sepsis and anxiety.  Past Medical History: No past medical history on file. No past surgical history on file. Family History: No family history on file. Social History:  History  Alcohol Use: Not on file     History  Drug Use Not on file    History   Social History  . Marital Status: Married    Spouse Name: N/A  . Number of Children: N/A  . Years of Education: N/A   Social History Main Topics  . Smoking status: Not on file  . Smokeless tobacco: Not on file  . Alcohol Use: Not on file  . Drug Use: Not on file  . Sexual Activity: Not on file   Other Topics Concern  . Not on file   Social History Narrative  . No narrative on file   Additional Social History:                          Allergies:   Allergies  Allergen Reactions  . Asa [Aspirin]   . Avelox [Moxifloxacin]   . Cephalexin   . Codeine   . Erythromycin Ethylsuccinate   . Flagyl [Metronidazole]   . Latex   . Levaquin [Levofloxacin] Hives  . Meloxicam   . Shellfish Allergy Nausea And Vomiting and  Other (See Comments)    Dizzy, fainting  . Sulfa Antibiotics   . Topiramate   . Tetracyclines & Related Rash    Labs:  Results for orders placed or performed during the hospital encounter of 11/10/14 (from the past 48 hour(s))  Glucose, capillary     Status: None   Collection Time: 11/20/14  4:20 AM  Result Value Ref Range   Glucose-Capillary 82 70 - 99 mg/dL  Glucose, capillary     Status: None   Collection Time: 11/20/14  7:48 AM  Result Value Ref Range   Glucose-Capillary 84 70 - 99 mg/dL  Glucose, capillary     Status: Abnormal   Collection Time: 11/20/14 11:05 AM  Result Value Ref Range   Glucose-Capillary 127 (H) 70 - 99 mg/dL  Glucose, capillary     Status: None   Collection Time: 11/20/14  4:17 PM  Result Value Ref Range   Glucose-Capillary 90 70 - 99 mg/dL  Glucose, capillary     Status: Abnormal   Collection Time: 11/20/14  8:07 PM  Result Value Ref Range   Glucose-Capillary 111 (H) 70 - 99 mg/dL  Glucose, capillary     Status: Abnormal   Collection Time: 11/20/14 11:58 PM  Result Value Ref Range   Glucose-Capillary 104 (H) 70 - 99  mg/dL  Glucose, capillary     Status: None   Collection Time: 11/21/14  4:49 AM  Result Value Ref Range   Glucose-Capillary 94 70 - 99 mg/dL  Glucose, capillary     Status: Abnormal   Collection Time: 11/21/14  8:13 AM  Result Value Ref Range   Glucose-Capillary 103 (H) 70 - 99 mg/dL  Glucose, capillary     Status: None   Collection Time: 11/21/14 11:29 AM  Result Value Ref Range   Glucose-Capillary 97 70 - 99 mg/dL  Glucose, capillary     Status: Abnormal   Collection Time: 11/21/14  4:06 PM  Result Value Ref Range   Glucose-Capillary 160 (H) 70 - 99 mg/dL  Glucose, capillary     Status: None   Collection Time: 11/21/14  8:18 PM  Result Value Ref Range   Glucose-Capillary 81 70 - 99 mg/dL    Vitals: Blood pressure 95/63, pulse 91, temperature 98.4 F (36.9 C), temperature source Oral, resp. rate 20, height 5' (1.524 m),  weight 50.939 kg (112 lb 4.8 oz), SpO2 98 %.  Risk to Self: Is patient at risk for suicide?: No Risk to Others:   Prior Inpatient Therapy:   Prior Outpatient Therapy:    Current Facility-Administered Medications  Medication Dose Route Frequency Provider Last Rate Last Dose  . ALPRAZolam Duanne Moron) tablet 0.25 mg  0.25 mg Oral 3 times per day Theodoro Grist, MD   0.25 mg at 11/21/14 2154  . atorvastatin (LIPITOR) tablet 40 mg  40 mg Oral Daily Theodoro Grist, MD   40 mg at 11/21/14 1036  . budesonide-formoterol (SYMBICORT) 160-4.5 MCG/ACT inhaler 2 puff  2 puff Inhalation BID Theodoro Grist, MD   2 puff at 11/21/14 1045  . DULoxetine (CYMBALTA) DR capsule 90 mg  90 mg Oral Q24H Theodoro Grist, MD   90 mg at 11/21/14 0832  . famotidine (PEPCID) tablet 20 mg  20 mg Oral Q24H Theodoro Grist, MD   20 mg at 11/21/14 1036  . feeding supplement (ENSURE ENLIVE) (ENSURE ENLIVE) liquid 237 mL  237 mL Oral BID AC Doctor Chlconversion, MD   237 mL at 11/21/14 1700  . fluticasone (FLONASE) 50 MCG/ACT nasal spray 2 spray  2 spray Each Nare Daily Theodoro Grist, MD   2 spray at 11/21/14 1040  . guaiFENesin (MUCINEX) 12 hr tablet 600 mg  600 mg Oral BID Doctor Chlconversion, MD   600 mg at 11/21/14 2154  . heparin injection 5,000 Units  5,000 Units Subcutaneous 3 times per day Theodoro Grist, MD   5,000 Units at 11/21/14 2154  . insulin aspart (novoLOG) injection 0-12 Units  0-12 Units Subcutaneous 6 times per day Doctor Chlconversion, MD   4 Units at 11/21/14 1706  . ipratropium-albuterol (DUONEB) 0.5-2.5 (3) MG/3ML nebulizer solution 3 mL  3 mL Nebulization QID Epifanio Lesches, MD   3 mL at 11/21/14 2030  . ipratropium-albuterol (DUONEB) 0.5-2.5 (3) MG/3ML nebulizer solution 3 mL  3 mL Nebulization Q4H PRN Epifanio Lesches, MD   3 mL at 11/21/14 0103  . LORazepam (ATIVAN) injection 1-2 mg  1-2 mg Intravenous Q2H PRN Doctor Chlconversion, MD      . meropenem (MERREM) 1 g in sodium chloride 0.9 % 100 mL IVPB  1 g  Intravenous 3 times per day Theodoro Grist, MD   1 g at 11/21/14 2200  . morphine (MSIR) tablet 7.5 mg  7.5 mg Oral Q4H PRN Doctor Chlconversion, MD      . ondansetron [employer] [facility])  injection 4 mg  4 mg Intravenous Q4H PRN Doctor Chlconversion, MD      . potassium chloride SA (K-DUR,KLOR-CON) CR tablet 20 mEq  20 mEq Oral Daily Doctor Chlconversion, MD   20 mEq at 11/21/14 1036  . RABEprazole (ACIPHEX) EC tablet 20 mg  20 mg Oral QAC breakfast Doctor Chlconversion, MD   20 mg at 11/21/14 6222  . sennosides (SENOKOT) 8.8 MG/5ML syrup 10 mL  10 mL Oral Q8H PRN Doctor Chlconversion, MD      . sodium chloride 0.9 % injection 5 mL  5 mL Intravenous Daily Doctor Chlconversion, MD   5 mL at 11/21/14 1040  . sodium chloride 0.9 % injection 5-10 mL  5-10 mL Intravenous PRN Doctor Chlconversion, MD        Musculoskeletal: Strength & Muscle Tone: within normal limits Gait & Station: unsteady Patient leans: N/A  Psychiatric Specialty Exam: Physical Exam  Constitutional: She appears well-developed.  HENT:  Head: Normocephalic.  Eyes: Pupils are equal, round, and reactive to light.  Neck: Normal range of motion.  Respiratory: Effort normal.  Skin: Skin is warm.  Psychiatric: Her speech is normal and behavior is normal. Judgment and thought content normal. Her mood appears anxious. Her affect is not angry. Cognition and memory are normal. She does not exhibit a depressed mood.    Review of Systems  Psychiatric/Behavioral: Negative for depression, suicidal ideas, hallucinations, memory loss and substance abuse. The patient is nervous/anxious. The patient does not have insomnia.     Blood pressure 95/63, pulse 91, temperature 98.4 F (36.9 C), temperature source Oral, resp. rate 20, height 5' (1.524 m), weight 50.939 kg (112 lb 4.8 oz), SpO2 98 %.Body mass index is 21.93 kg/(m^2).  General Appearance: Fairly Groomed  Engineer, water::  Good  Speech:  Clear and Coherent  Volume:  Normal  Mood:  Anxious   Affect:  Congruent  Thought Process:  Coherent  Orientation:  Full (Time, Place, and Person)  Thought Content:  WDL  Suicidal Thoughts:  No  Homicidal Thoughts:  No  Memory:  Immediate;   Good Recent;   Good Remote;   Good  Judgement:  Good  Insight:  Good  Psychomotor Activity:  Normal  Concentration:  Good  Recall:  Good  Fund of Knowledge:Good  Language: Good  Akathisia:  No  Handed:  Right  AIMS (if indicated):     Assets:  Financial Resources/Insurance Housing Intimacy Social Support  ADL's:  Intact  Cognition: WNL  Sleep:      Medical Decision Making: Established Problem, Stable/Improving (1), Review of Psycho-Social Stressors (1), Review or order clinical lab tests (1), Decision to obtain old records (1), Review and summation of old records (2) and Review of Medication Regimen & Side Effects (2)  Treatment Plan Summary: Daily contact with patient to assess and evaluate symptoms and progress in treatment  Plan:  No evidence of imminent risk to self or others at present.   Patient does not meet criteria for psychiatric inpatient admission. Supportive therapy provided about ongoing stressors. Disposition: no indication for antipsychotic medication. Supportive therapy completed. Reviewed old records and meds. Will follow as needed  Alethia Berthold 11/21/2014 11:09 PM

## 2014-11-21 NOTE — Progress Notes (Signed)
Pt FBS has been WNL, no insulin coverage required. Medicated for pain once this shift per pt request for chronic pain.Pt now on mechanical soft diet and tolerating even though appetite remains poor to fair.

## 2014-11-21 NOTE — Progress Notes (Signed)
Patient Demographics  Tiffany Mclean, is a 64 y.o. female, DOB - [birth year], QPY:[id number]  Admit date - 11/10/2014   Admitting Physician Theodoro Grist, MD  Outpatient Primary MD for the patient is No primary care provider on file.  LOS - 11   No chief complaint on file.       Subjective:   Seen today,has some cough,unable to  Get the phlegm  Out.no SOb,feels better. Needs PET scan to evaluate for right lung mass/    Procedures bronchoscopy   Consults  pulmonary   Medications  Scheduled Meds: . ALPRAZolam  0.25 mg Oral 3 times per day  . atorvastatin  40 mg Oral Daily  . budesonide-formoterol  2 puff Inhalation BID  . DULoxetine  90 mg Oral Q24H  . famotidine  20 mg Oral Q24H  . feeding supplement (ENSURE ENLIVE)  237 mL Oral BID AC  . fluticasone  2 spray Each Nare Daily  . guaiFENesin  600 mg Oral BID  . heparin subcutaneous  5,000 Units Subcutaneous 3 times per day  . insulin aspart  0-12 Units Subcutaneous 6 times per day  . ipratropium-albuterol  3 mL Nebulization QID  . potassium chloride  20 mEq Oral Daily  . RABEprazole  20 mg Oral QAC breakfast  . sodium chloride  5 mL Intravenous Daily   Continuous Infusions:  PRN Meds:.acetaminophen, ipratropium-albuterol, LORazepam, morphine, ondansetron (ZOFRAN) IV, sennosides, sodium chloride  DVT Prophylaxis SCD  Lab Results  Component Value Date   PLT 511* 11/17/2014    Antibiotics   Anti-infectives    Start     Dose/Rate Route Frequency Ordered Stop   11/20/14 0200  meropenem (MERREM) 1 g in sodium chloride 0.9 % 50 mL IVPB     1 g 100 mL/hr over 30 Minutes Intravenous 3 times per day 11/19/14 1125 11/20/14 1759          Objective:   Filed Vitals:   11/20/14 2054 11/21/14 0107 11/21/14 0446 11/21/14 0650  BP:   104/48    Pulse:   73   Temp:   97.5 F (36.4 C)   TempSrc:   Oral   Resp:   18   Height:      Weight:    50.939 kg (112 lb 4.8 oz)  SpO2: 95% 96% 98%     Wt Readings from Last 3 Encounters:  11/21/14 50.939 kg (112 lb 4.8 oz)     Intake/Output Summary (Last 24 hours) at 11/21/14 0809 Last data filed at 11/20/14 1813  Gross per 24 hour  Intake    650 ml  Output      0 ml  Net    650 ml     Physical Exam  Awake Alert, Oriented X 3, No new F.N deficits, Normal affect [place].AT,PERRAL Supple Neck,No JVD, No cervical lymphadenopathy appriciated.  Symmetrical Chest wall movement, Good air movement bilaterally, CTAB RRR,No Gallops,Rubs or new Murmurs, No Parasternal Heave +ve B.Sounds, Abd Soft, No tenderness, No organomegaly appriciated, No rebound - guarding or rigidity. No Cyanosis, Clubbing or edema, No new Rash or bruise   Data Review   Micro Results Recent Results (from the past 240 hour(s))  Culture, expectorated sputum-assessment     Status: None  Collection Time: 11/11/14  6:00 PM  Result Value Ref Range Status   Micro Text Report   Final       SOURCE: ENDOTRACHEAL SUCTION    COMMENT                   APPEARS TO BE NORMAL FLORA AT 48 HOURS   GRAM STAIN                MODERATE WHITE BLOOD CELLS   GRAM STAIN                NO ORGANISMS SEEN   GRAM STAIN                GOOD SPECIMEN-80-90% WBC   ANTIBIOTIC                                                      Bronchial Wash Culture     Status: None   Collection Time: 11/14/14  4:30 AM  Result Value Ref Range Status   Micro Text Report   Final       COMMENT                   APPEARS TO BE NORMAL FLORA AT 65 HOURS   GRAM STAIN                FEW WHITE BLOOD CELLS   GRAM STAIN                MODERATE GRAM POSITIVE COCCI IN PAIRS   GRAM STAIN                RARE GRAM NEGATIVE ROD   ANTIBIOTIC                                                        Radiology Reports Ct Chest W Contrast  11/11/2014   CLINICAL DATA:   Evaluate for possible lung mass. History of pneumonia.  EXAM: CT CHEST WITH CONTRAST  TECHNIQUE: Multidetector CT imaging of the chest was performed during intravenous contrast administration.  CONTRAST:  75 cc Omnipaque 300  COMPARISON:  Chest CT 08/29/2014  FINDINGS: Mediastinum/Nodes: Endotracheal tube terminates in the mid trachea. Enteric tube courses inferior to the diaphragm, tip projects in the stomach. No enlarged axillary, mediastinal or hilar lymphadenopathy. Heart is normal in size. No pericardial effusion. There is rightward shift of the mediastinum.  Lungs/Pleura: Suggestion of an intraluminal lesion within the right middle lobe bronchi (image 36; series 3). When compared to prior examination (CT 08/29/2014) there has been near complete total atelectasis of the right middle lobe. Interval development of extensive predominantly peripheral irregular masslike pulmonary consolidation within the right lower lobe. No significant interval change in subpleural nodularity involving the right upper lobe. Small right pleural effusion.  Upper abdomen: No consolidative or nodular pulmonary opacities. Adrenal glands are unremarkable. No aggressive or acute appearing osseous lesions.  Musculoskeletal: No aggressive or acute appearing osseous lesions. Bilateral breast implants.  IMPRESSION: 1. Near complete atelectasis/consolidation of the right middle lobe. There is a filling defect within the right middle lobe bronchus, likely secondary to mucous plugging.  Mass is not entirely excluded. This may be partially responsible for the right middle lobe atelectasis. 2. Extensive predominantly subpleural masslike consolidation within the right lower lobe is nonspecific however may be infectious/inflammatory etiology as this was not present on prior CT 08/29/2014. Recommend short-term followup chest CT in 3-4 weeks after completion of appropriate antibiotic therapy to evaluate for interval improvement and exclude the  possibility of underlying mass/neoplasm.   Electronically Signed   By: Lovey Newcomer M.D.   On: 11/11/2014 16:26   Dg Chest Port 1 View  11/14/2014   CLINICAL DATA:  Respiratory failure.  EXAM: PORTABLE CHEST - 1 VIEW  COMPARISON:  CT 11/11/2014.  Chest x-ray 11/11/2014.  FINDINGS: Endotracheal tube, NG tube, left IJ line in stable position. Heart size stable. Pulmonary vascularity is normal. Persistent dense atelectasis and/or infiltrate right lung noted. Persistent right pleural effusion noted. No pneumothorax.  IMPRESSION: 1. Lines and tubes in stable position. 2. Persistent dense right lower lobe atelectasis and/or infiltrate with persistent small right pleural effusion.   Electronically Signed   By: [name]  Register   On: 11/14/2014 07:43   Dg Chest Port 1 View  11/11/2014   CLINICAL DATA:  Central line placement.  EXAM: PORTABLE CHEST - 1 VIEW  COMPARISON:  11/10/2014  FINDINGS: Endotracheal tube tip projects 3.9 cm proximal to the carina. Left IJ catheter tip projects over the distal SVC. NG tube descends below the level of the image. Other wires and tubes are presumably external. Breast prostheses obscure detailed lung evaluation. Allowing for this, no overt pneumothorax or consolidation on the left. Persistent right middle lobe consolidation is similar. Cardiomediastinal contours are partially obscured. No interval osseous change.  IMPRESSION: Left IJ catheter tip projects over the distal SVC.  No pneumothorax.  Unchanged endotracheal and NG tubes.  Persistent right middle lobe consolidation.   Electronically Signed   By: Carlos Levering M.D.   On: 11/11/2014 01:38   Dg Chest Port 1 View  11/10/2014   CLINICAL DATA:  Post intubation  EXAM: PORTABLE CHEST - 1 VIEW  COMPARISON:  Prior film same day  FINDINGS: Cardiomediastinal silhouette is stable. Hyperinflation again noted. Persistent consolidation in right middle lobe. Endotracheal tube in place with tip 3.6 cm above the carina. No pneumothorax.   IMPRESSION: Hyperinflation again noted. Persistent consolidation in right middle lobe. Endotracheal tube in place with tip 3.6 cm above the carina. No pneumothorax.   Electronically Signed   By: Lahoma Crocker M.D.   On: 11/10/2014 16:11   Dg Chest Port 1 View  11/10/2014   CLINICAL DATA:  Pneumonia, shortness of breath beginning today  EXAM: PORTABLE CHEST - 1 VIEW  COMPARISON:  Portable exam 1422 hours compared to 10/23/2014  FINDINGS: Normal heart size, mediastinal contours and pulmonary vascularity.  Emphysematous changes compatible with COPD.  Increased consolidation RIGHT middle lobe.  Mild volume loss RIGHT chest with mediastinal shift to RIGHT.  Remaining lungs clear.  No pleural effusion or pneumothorax.  Bones demineralized with evidence of prior cervical spine fusion.  BILATERAL breast prostheses.  IMPRESSION: COPD changes with increased consolidation in RIGHT middle lobe; radiographic followup until resolution recommended to exclude underlying abnormalities.   Electronically Signed   By: Lavonia Dana M.D.   On: 11/10/2014 15:04     CBC  Recent Labs Lab 11/14/14 0817 11/15/14 0331 11/16/14 0410 11/17/14 0409  WBC 12.4* 12.2* 18.7* 18.0*  HGB 8.3* 8.2* 9.0* 9.1*  HCT 26.6* 26.0* 29.1* 29.0*  PLT 403 400 487*  511*  MCV 87 86 86 85  MCH 27.0 26.9 26.5 26.7  MCHC 31.0* 31.4* 30.9* 31.4*  RDW 17.2* 17.2* 17.3* 17.8*    Chemistries   Recent Labs Lab 11/14/14 0817 11/15/14 0331 11/16/14 0410 11/17/14 0409 11/18/14 1045  NA 141 139 139 137 138  K 4.8 4.2 4.0 3.3* 3.2*  CL 109 105 100* 93* 95*  CO2 31 32 34* 38* 36*  GLUCOSE 116* 111* 112* 112* 129*  BUN 35* 33* 33* 20 12  CREATININE 0.60 0.50 0.43* 0.44 0.49  CALCIUM 9.2 9.1 8.9 8.8* 8.9   ------------------------------------------------------------------------------------------------------------------ estimated creatinine clearance is 51 mL/min (by C-G formula based on Cr of  0.49). ------------------------------------------------------------------------------------------------------------------ No results for input(s): HGBA1C in the last 72 hours. ------------------------------------------------------------------------------------------------------------------ No results for input(s): CHOL, HDL, LDLCALC, TRIG, CHOLHDL, LDLDIRECT in the last 72 hours. ------------------------------------------------------------------------------------------------------------------ No results for input(s): TSH, T4TOTAL, T3FREE, THYROIDAB in the last 72 hours.  Invalid input(s): FREET3 ------------------------------------------------------------------------------------------------------------------ No results for input(s): VITAMINB12, FOLATE, FERRITIN, TIBC, IRON, RETICCTPCT in the last 72 hours.  Coagulation profile No results for input(s): INR, PROTIME in the last 168 hours.  No results for input(s): DDIMER in the last 72 hours.  Cardiac Enzymes No results for input(s): CKMB, TROPONINI, MYOGLOBIN in the last 168 hours.  Invalid input(s): CK ------------------------------------------------------------------------------------------------------------------ Invalid input(s): POCBNP Admit diagnosis; Acute on chronic respiratory failure Assessment/plan; 1.acute on chronic respiratory failure due to pneumonia On IV abx/s/p extubation bronchoscopy negative for malignancy Needs pEt scan to evaluate for  Recurrent pneumonia 2.anxiety 3.deconditioning;needs HHPT Possible d/c in next 2-3 days   Time Spent in minutes   44 min   Immaculate Crutcher M.D on 11/21/2014 at 8:09 AM

## 2014-11-21 NOTE — Progress Notes (Signed)
Chart reviewed. Pt visited. Pt c/o not being able to have coleslaw and other raw vegetables and requested to try a regular diet. SLP discussed reason for mechanical soft consistency after initial assessment but will do a trial of regular foods. Pt reports she will have family cut foods when needed and go back to mechanical soft if the foods are too tough. Pt encouraged to follow aspiration precautions and take periods of rest during meals.

## 2014-11-21 NOTE — Progress Notes (Signed)
Dr. Vianne Bulls spoke to family regarding the plan for patient. They have been anxious about her being discharged too soon. Pt VSS during this shift. Pain resolved with oral meds. IV abx continue. Encouraged incentive spirometer use & cough & deep breathing.

## 2014-11-21 NOTE — Progress Notes (Signed)
Physical Therapy Treatment Patient Details Name: Tiffany Mclean MRN: [id number] DOB: 02/28/1951 Today's Date: 11/21/2014    History of Present Illness Pt is a pleasant 64 year old female who was admitted for acute respiratory failure. Pt on the ventilator 11/09/13-11/16/14. Pt with multiple admissions for acute respiratory failure and now complaints of dyspnea. Pt with history of fibromyalgia, COPD, GERD, and now questionable R LL infiltrate with possible PET scan.    PT Comments    Pt's O2 remained > or equal to 93% throughout session on 2 L/min via nasal cannula.  Pt's HR increased from 79 to 104 bpm with general ex's in bed so pt required pacing and rest breaks between ex's to limit HR increase.  Pt's son and pt educated on bed LE ex's, pacing/energy conservation, and monitoring of HR and O2 with activity (and how to modify ex's as needed to keep vitals in appropriate range).  Pt's room set-up for transfer to chair but unable to perform d/t staff present to take pt for chest x-ray.  Will re-attempt transfer to chair next session as able.    Follow Up Recommendations  SNF     Equipment Recommendations  Other (comment) (ramp to enter home)    Recommendations for Other Services       Precautions / Restrictions Precautions Precautions: Fall Restrictions Weight Bearing Restrictions: No    Mobility  Bed Mobility                  Transfers                    Ambulation/Gait                 Stairs            Wheelchair Mobility    Modified Rankin (Stroke Patients Only)       Balance                                    Cognition Arousal/Alertness: Awake/alert Behavior During Therapy: WFL for tasks assessed/performed Overall Cognitive Status: Within Functional Limits for tasks assessed                      Exercises  Performed semi-supine B LE therapeutic exercises x10 reps (except x5 for hip abd/adduction) AROM:  ankle pumps, quad sets, glute squeezes, SAQ's, heelslides, hip abd/adduction, and hip adduction isometrics (pillow between knees).    General Comments        Pertinent Vitals/Pain Pain Assessment: No/denies pain         O2 at rest: 98% on 2 L/min via nasal cannula; O2 with activity >92% on 2 L/min via nasal cannula        HR at rest: 79 bpm at rest; HR with activity: 91-104 bpm  Home Living                      Prior Function            PT Goals (current goals can now be found in the care plan section) Progress towards PT goals: Progressing toward goals (gradual)    Frequency  Min 2X/week    PT Plan Current plan remains appropriate    Co-evaluation             End of Session   Activity Tolerance: Other (comment) (Pt required pacing d/t  increased HR with LE ex's in bed) Patient left: in bed;with bed alarm set;with call bell/phone within reach (Pt being brought to x-ray for imaging)     Time: 2440-1027 PT Time Calculation (min) (ACUTE ONLY): 46 min  Charges:  $Therapeutic Exercise: 38-52 mins                    G Codes:      Leitha Bleak, PT Raquel Sarna Douglass [place] 11/21/2014, 3:35 PM

## 2014-11-21 NOTE — Care Management Note (Signed)
Case Management Note  Patient Details  Name: Tiffany [facility]clean MRN: [id number] Date of Birth: 1950-07-28  Subjective/Objective:                    Action/Plan:   Expected Discharge Date:  11/21/14               Expected Discharge Plan:  [place]  In-House Referral:     Discharge planning Services  CM Consult  Post Acute Care Choice:  Home Health Choice offered to:  Patient  DME Arranged:    DME Agency:     HH Arranged:  RN, PT HH Agency:  [facility]  Status of Service:  In process, will continue to follow  Medicare Important Message Given:    Date Medicare IM Given:  11/21/14 Medicare IM give by:  Marshell Garfinkel Date Additional Medicare IM Given:    Additional Medicare Important Message give by:     If discussed at [place] of Stay Meetings, dates discussed:    Additional Comments: Met with patient to discuss discharge planning. She confirms that she is open to [facility] home health and that she lives with a supportive son [name]. She would like for PT to show [name] the exercises patient needs so that he can support her in that in the home. She states she uses a wheelchair and rolling walker in the home. She states she is on home O2 through [place].   Marshell Garfinkel, RN 11/21/2014, 8:54 AM

## 2014-11-22 ENCOUNTER — Inpatient Hospital Stay: Payer: BLUE CROSS/BLUE SHIELD

## 2014-11-22 ENCOUNTER — Encounter: Payer: Self-pay | Admitting: *Deleted

## 2014-11-22 ENCOUNTER — Encounter: Payer: Self-pay | Admitting: Internal Medicine

## 2014-11-22 DIAGNOSIS — R410 Disorientation, unspecified: Secondary | ICD-10-CM

## 2014-11-22 LAB — GLUCOSE, CAPILLARY
GLUCOSE-CAPILLARY: 97 mg/dL (ref 70–99)
GLUCOSE-CAPILLARY: 89 mg/dL (ref 70–99)
Glucose-Capillary: 80 mg/dL (ref 70–99)
Glucose-Capillary: 137 mg/dL — ABNORMAL HIGH (ref 70–99)
Glucose-Capillary: 114 mg/dL — ABNORMAL HIGH (ref 70–99)
Glucose-Capillary: 85 mg/dL (ref 70–99)

## 2014-11-22 NOTE — Progress Notes (Signed)
ANTIBIOTIC CONSULT NOTE - FOLLOW UP  Pharmacy Consult for Meropenem Indication: HCAP  Allergies  Allergen Reactions  . Asa [Aspirin]   . Avelox [Moxifloxacin]   . Cephalexin   . Codeine   . Erythromycin Ethylsuccinate   . Flagyl [Metronidazole]   . Latex   . Levaquin [Levofloxacin] Hives  . Meloxicam   . Shellfish Allergy Nausea And Vomiting and Other (See Comments)    Dizzy, fainting  . Sulfa Antibiotics   . Topiramate   . Tetracyclines & Related Rash    Patient Measurements: Height: 5' (152.4 cm) Weight: 113 lb 9.6 oz (51.529 kg) IBW/kg (Calculated) : 45.5   Vital Signs: Temp: 98 F (36.7 C) (05/03 0729) Temp Source: Oral (05/03 0729) BP: 94/57 mmHg (05/03 0729) Pulse Rate: 83 (05/03 0729)  Labs: No results for input(s): WBC, HGB, PLT, LABCREA, CREATININE in the last 72 hours. Estimated Creatinine Clearance: 51 mL/min (by C-G formula based on Cr of 0.49).   Microbiology: Culture, blood (single)     Status: None   Collection Time: 11/10/14  2:15 PM  Result Value Ref Range Status   Micro Text Report   Final       COMMENT                   NO GROWTH AEROBICALLY/ANAEROBICALLY IN 5 DAYS   ANTIBIOTIC                                                      Culture, blood (single)     Status: None   Collection Time: 11/10/14  2:15 PM  Result Value Ref Range Status   Micro Text Report   Final       COMMENT                   NO GROWTH AEROBICALLY/ANAEROBICALLY IN 5 DAYS   ANTIBIOTIC                                                      Culture, expectorated sputum-assessment     Status: None   Collection Time: 11/11/14  6:00 PM  Result Value Ref Range Status   Micro Text Report   Final       SOURCE: ENDOTRACHEAL SUCTION    COMMENT                   APPEARS TO BE NORMAL FLORA AT 48 HOURS   GRAM STAIN                MODERATE WHITE BLOOD CELLS   GRAM STAIN                NO ORGANISMS SEEN   GRAM STAIN                GOOD SPECIMEN-80-90% WBC   ANTIBIOTIC                                                       Bronchial Wash  Culture     Status: None   Collection Time: 11/14/14  4:30 AM  Result Value Ref Range Status   Micro Text Report   Final       COMMENT                   APPEARS TO BE NORMAL FLORA AT 48 HOURS   GRAM STAIN                FEW WHITE BLOOD CELLS   GRAM STAIN                MODERATE GRAM POSITIVE COCCI IN PAIRS   GRAM STAIN                RARE GRAM NEGATIVE ROD   ANTIBIOTIC                                                        Anti-infectives    Start     Dose/Rate Route Frequency Ordered Stop   11/21/14 1430  meropenem (MERREM) 1 g in sodium chloride 0.9 % 50 mL IVPB  Status:  Discontinued     1 g 100 mL/hr over 30 Minutes Intravenous 3 times per day 11/21/14 1424 11/21/14 1429   11/21/14 1430  meropenem (MERREM) 1 g in sodium chloride 0.9 % 100 mL IVPB     1 g 200 mL/hr over 30 Minutes Intravenous 3 times per day 11/21/14 1429     11/21/14 1315  meropenem (MERREM) 500 mg in sodium chloride 0.9 % 50 mL IVPB  Status:  Discontinued     500 mg 100 mL/hr over 30 Minutes Intravenous Every 12 hours 11/21/14 1302 11/21/14 1424   11/20/14 0200  meropenem (MERREM) 1 g in sodium chloride 0.9 % 50 mL IVPB     1 g 100 mL/hr over 30 Minutes Intravenous 3 times per day 11/19/14 1125 11/20/14 1759      Assessment: No growth in Bronchial wash in 48 hours.  WBC of 18 on 4/28.  Goal of Therapy:  Resolution of infection.    Plan:  Continue current orders for meropenem 1000mg  IV Q8hr. Will obtain follow-up wbc. Patient with multiple drug allergies making narrowing and transition to oral antibiotics difficult. Recommend considering discontinuing meropenem after completion of all doses on 5/3 as patient will have received > 10 days of therapy. Cultures remain negative.   Tiffany Mclean L 11/22/2014,9:39 AM

## 2014-11-22 NOTE — Progress Notes (Signed)
Physical Therapy Treatment Patient Details Name: Tiffany Mclean MRN: [id number] DOB: 28-Jul-1950 Today's Date: 11/22/2014    History of Present Illness Pt is a pleasant 64 year old female who was admitted for acute respiratory failure. Pt on the ventilator 11/09/13-11/16/14. Pt with multiple admissions for acute respiratory failure and now complaints of dyspnea. Pt with history of fibromyalgia, COPD, GERD, and now questionable R LL infiltrate with possible PET scan.    PT Comments    Pt able to progress to taking steps with RW to chair and then to bedside commode with assist.  Limited activity appeared to be fatiguing for pt and pt with posterior lean with standing and mobility (appears to be d/t tight heelcords from extended immobility in bed).  Will continue to progress pt with functional mobility per pt tolerance (HR increased from 76 bpm at rest to 104 bpm with transfer bed to chair).   Follow Up Recommendations  SNF     Equipment Recommendations  Other (comment)    Recommendations for Other Services       Precautions / Restrictions Precautions Precautions: Fall Restrictions Weight Bearing Restrictions: No    Mobility  Bed Mobility Overal bed mobility: Needs Assistance Bed Mobility: Supine to Sit     Supine to sit: Supervision (HOB elevated, increased time to perform)        Transfers Overall transfer level: Needs assistance Equipment used: Rolling walker (2 wheeled) Transfers: Sit to/from [facility] Sit to Stand: +2 safety/equipment;Min assist;Mod assist Stand pivot transfers: Min assist;Mod assist;+2 safety/equipment  2nd transfer chair to commode, pt min to mod assist x1 with RW d/t posterior lean     General transfer comment: posterior lean with standing (d/t tight heelcords); transfer bed to chair and then chair to bedside commode  Ambulation/Gait Ambulation/Gait assistance: Min assist;Mod assist;+2 safety/equipment Ambulation Distance  (Feet): 5 Feet Assistive device: Rolling walker (2 wheeled) Gait Pattern/deviations: Decreased step length - right;Decreased step length - left;Decreased stride length;Decreased dorsiflexion - right;Decreased dorsiflexion - left     General Gait Details: decreased stance time R LE; R knee partially flexed; posterior lean d/t tight heelcords   Stairs            Wheelchair Mobility    Modified Rankin (Stroke Patients Only)       Balance Overall balance assessment: Needs assistance         Standing balance support: Bilateral upper extremity supported (posterior lean) with use of RW                        Cognition Arousal/Alertness: Awake/alert Behavior During Therapy: WFL for tasks assessed/performed Overall Cognitive Status: Within Functional Limits for tasks assessed                      Exercises      General Comments        Pertinent Vitals/Pain Pain Assessment: 0-10 Pain Score: 5  Pain Location: heelcords with standing Pain Intervention(s): Limited activity within patient's tolerance           At rest HR 76 bpm, O2 97% on 2 L/min via nasal cannula, and BP 95/65;  Sitting EOB HR 85 bpm, O2 100% on 2 L/min via nasal cannula, and BP 101/65; after transfer to chair pt's HR 104 bpm, O2 97% on 2 L/min via nasal cannula, and BP 129/80 (nursing notified regarding vitals).  Home Living  Prior Function            PT Goals (current goals can now be found in the care plan section) Progress towards PT goals: Progressing toward goals    Frequency  Min 2X/week    PT Plan Current plan remains appropriate    Co-evaluation             End of Session Equipment Utilized During Treatment: Gait belt Activity Tolerance: Patient limited by fatigue Patient left: Other (comment) (pt on bedside commode with nursing present end of session)     Time: 1125-1150 PT Time Calculation (min) (ACUTE ONLY): 25 min  Charges:   $Therapeutic Activity: 23-37 mins                    G Codes:      Pete Schnitzer 2014/12/14, 12:09 PM

## 2014-11-22 NOTE — Consult Note (Signed)
Chaplain provided patient and family with Advance Directive (AD) Education, chaplain completed AD with Patient, Placed copy of AD in patient file, provided patient with AD copies.

## 2014-11-22 NOTE — Care Management Note (Addendum)
Case Management Note  Patient Details  Name: Tiffany Mclean MRN: [id number] Date of Birth: 01/22/1951  Subjective/Objective:                    Action/Plan:  Notified by [facility] home health that patient is NOT open to their services. Patient states she was unhappy about her home health agency's RN not coming to her house when they said she would but she was happy with the physical therapist. Patient did not know what agency was following her. She went to [facility], completed PT, transferred to [facility], and then sent home (was denied SNF by her health insurance) with [facility] home health. Message left for [name] with [facility] home health intake [phone].  Expected Discharge Date:                  Expected Discharge Plan:  [place]  In-House Referral:     Discharge planning Services  CM Consult  Post Acute Care Choice:  Home Health Choice offered to:  Patient  DME Arranged:    DME Agency:     HH Arranged:  RN, PT [place] Agency:  [facility]  Status of Service:  In process, will continue to follow  Medicare Important Message Given:    Date Medicare IM Given:  11/21/14 Medicare IM give by:  Marshell Garfinkel Date Additional Medicare IM Given:    Additional Medicare Important Message give by:     If discussed at [place] of Stay Meetings, dates discussed:    Additional Comments: Received callback from [facility] with [facility] home health. Fax orders to [phone]. Patient is open for nursing and physical therapy through [facility].   Marshell Garfinkel, RN 11/22/2014, 9:32 AM

## 2014-11-22 NOTE — Progress Notes (Addendum)
SLP f/u today with toleration of diet. Pt eating lunch and reports no difficulty with solid food upgrade. She reports she like the foods better and avoids anything that is too hard. No further SLP f/u at this time. Please reorder ST if any further dysphagia is noted.

## 2014-11-22 NOTE — Progress Notes (Signed)
Pt. Alert and oriented. VSS. Pain in back controlled with PO pain meds. Incontinent of urine. Pt. Receiving IV antibiotics. Pills whole with water. Tolerating PO's. Resting quietly at this time. Will continue to monitor.

## 2014-11-22 NOTE — Care Management Note (Addendum)
Case Management Note  Patient Details  Name: Tiffany Mclean MRN: [id number] Date of Birth: Jun 11, 1951  Subjective/Objective:                  Notified by Mel Almond CSW that patient's son Tiffany Mclean has questions related to home DME- O2 through [place] and [facility] expense.  Action/Plan: This RNCM advised Tiffany Mclean to call Huey Romans with complaints about home O2 previously arranged [phone], and [facility] which currently has two electric wheelchairs for $500 ea.  Tiffany Mclean has been given [facility]'s contact number for home health [phone].   Expected Discharge Date:                  Expected Discharge Plan:  [place]  In-House Referral:     Discharge planning Services  CM Consult  Post Acute Care Choice:  Home Health Choice offered to:  Patient  DME Arranged:    DME Agency:     HH Arranged:  RN, PT HH Agency:  [facility]  Status of Service:  In process, will continue to follow  Medicare Important Message Given:    Date Medicare IM Given:  11/21/14 Medicare IM give by:  Marshell Garfinkel Date Additional Medicare IM Given:    Additional Medicare Important Message give by:     If discussed at [place] of Stay Meetings, dates discussed:    Additional Comments:  Marshell Garfinkel, RN 11/22/2014, 10:24 AM

## 2014-11-22 NOTE — Clinical Social Work Note (Signed)
Clinical Social Work Assessment  Patient Details  Name: Tiffany Mclean MRN: [id number] Date of Birth: Mar 20, 1951  Date of referral:  11/22/14               Reason for consult:  Facility Placement, Emotional/Coping/Adjustment to Illness, Discharge Planning                Permission sought to share information with:  Chartered certified accountant granted to share information::  Yes, Verbal Permission Granted  Name::  Tiffany Mclean     Agency::   [name]  Contact Information:   Tiffany [facility] liaision  Housing/Transportation Living arrangements for the past 2 months:  [facility]iffany [facility] of Information:  Patient, Adult Children Patient Interpreter Needed:  None Criminal Activity/Legal Involvement Pertinent to Current Situation/Hospitalization:  No - Comment as needed Significant Relationships:  Adult Children, Spouse Lives with:  Spouse Do you feel safe going back to the place where you live?  Yes Need for family participation in patient care:  Yes (Comment)   Social Worker assessment / plan: [place] representative (CSW) met with patient to discuss D/C plan. PT is recommending SNF. Patient's son Tiffany Mclean was at bedside during assessment. CSW introduced self and explained role of CSW department. Patient reported that she lives with her husband Tiffany Mclean in [place] and her son Tiffany Mclean lives near by. Patient reported that she has had a rough 2 months. She has been in and out of the [facility] and rehab. Patient D/C'ed from [facility] in February 2016 to Tiffany [facility] under [place]. During her stay at [facility] patient was sent to [facility] denied her SNF stay, in which she could not go back to [facility] and had to return home. Patient was at home a few days from [facility] when a home health PT came to her house checked her vitals and called 911 and is now at [facility]. Patient reported that she is frustrated due to being D/C'ed too early and is going to refuse to D/C from [facility] until  she feels she is ready. Patient reported that if the D/C's from [facility] "too early" her husband will drive her straight back to the ER. CSW provided emotional support and explained SNF process. CSW made patient aware that [facility] could deny patient again for SNF stay. Patient reported that she would like to pursue SNF authorization from [facility]. Patient prefers [facility] and reported that she was making great progress at [facility]. Patient spoke highly of the physical therapist at [facility]. CSW also discussed with patient the D/C plan if [facility] denies SNF stay. Patient is agreeable to going home with home health if [facility] denies SNF stay. Patient reported that she has already applied for Medicare and will be 64 this month. Patient reported that she was open to [facility].   FL2 complete and faxed out via [facility]. Clinicals have been uploaded. [facility] liaison will start [facility] authorization today. CSW will continue to follow and assist as needed.   Tiffany Mclean, LCSWA [phone]  Employment status:  Retired Nurse, adult PT Recommendations:  [name] / Referral to community [facility]:  [facility]  Patient/Family's Response to care: Patient and son Tiffany Mclean are agreeable to SNF at [facility] if [facility]. If [facility] denies SNF they are agreeable to going home.   Emotional Assessment Appearance:  Appears stated age, Well-Groomed Attitude/Demeanor/Rapport:    Affect (typically observed):  Calm, Pleasant Orientation:  Oriented to Self,  Oriented to Place, Oriented to  Time, Oriented to Situation Alcohol / Substance use:  Never Used Psych involvement (Current and /or in the community):  Yes (Comment)  Discharge Needs  Concerns to be addressed:  Discharge Planning Concerns, Financial / Insurance Concerns Readmission within the last 30 days:  Yes Current discharge risk:  Chronically ill, Dependent with Mobility Barriers to Discharge:  [place], LCSW 11/22/2014, 11:10 AM

## 2014-11-22 NOTE — Clinical Social Work Placement (Signed)
   CLINICAL SOCIAL WORK PLACEMENT  NOTE  Date:  11/22/2014  Patient Details  Name: BELA BONAPARTE MRN: [id number] Date of Birth: Jul 13, 1951  Clinical Social Work is seeking post-discharge placement for this patient at the [place] level of care (*CSW will initial, date and re-position this form in  chart as items are completed):  Yes   Patient/family provided with [place] Work Department's list of facilities offering this level of care within the geographic area requested by the patient (or if unable, by the patient's family).  Yes   Patient/family informed of their freedom to choose among providers that offer the needed level of care, that participate in Medicare, Medicaid or managed care program needed by the patient, have an available bed and are willing to accept the patient.  Yes   Patient/family informed of [facility]'s ownership interest in [facility] and [facility], as well as of the fact that they are under no obligation to receive care at these facilities.  PASRR submitted to EDS on       PASRR number received on       Existing PASRR number confirmed on 11/22/14     FL2 transmitted to all facilities in geographic area requested by pt/family on 11/22/14     FL2 transmitted to all facilities within larger geographic area on       Patient informed that his/her managed care company has contracts with or will negotiate with certain facilities, including the following:            Patient/family informed of bed offers received.  Patient chooses bed at       Physician recommends and patient chooses bed at      Patient to be transferred to   on  .  Patient to be transferred to facility by       Patient family notified on   of transfer.  Name of family member notified:        PHYSICIAN Please sign FL2     Additional Comment:    _______________________________________________ Loralyn Freshwater, LCSW 11/22/2014, 11:05  AM

## 2014-11-22 NOTE — Progress Notes (Signed)
Patient Demographics  Tiffany Mclean, is a 64 y.o. female, DOB - Sep 09, 1950, [id number]  Admit date - 11/10/2014   Admitting Physician Theodoro Grist, MD  Outpatient Primary MD for the patient is No primary care provider on file.  LOS - 12   No chief complaint on file.       Subjective:   Today she has lot of expectoration,coughing out thick phlegm,feeling better/d/w son in detail/   Procedures bronchoscopy   Consults  pulmonary   Medications  Scheduled Meds: . ALPRAZolam  0.25 mg Oral 3 times per day  . atorvastatin  40 mg Oral Daily  . budesonide-formoterol  2 puff Inhalation BID  . DULoxetine  90 mg Oral Q24H  . famotidine  20 mg Oral Q24H  . feeding supplement (ENSURE ENLIVE)  237 mL Oral BID AC  . fluticasone  2 spray Each Nare Daily  . guaiFENesin  600 mg Oral BID  . heparin subcutaneous  5,000 Units Subcutaneous 3 times per day  . insulin aspart  0-12 Units Subcutaneous 6 times per day  . ipratropium-albuterol  3 mL Nebulization QID  . meropenem (MERREM) IV  1 g Intravenous 3 times per day  . potassium chloride  20 mEq Oral Daily  . RABEprazole  20 mg Oral QAC breakfast  . sodium chloride  5 mL Intravenous Daily   Continuous Infusions:  PRN Meds:.ipratropium-albuterol, LORazepam, morphine, ondansetron (ZOFRAN) IV, sennosides, sodium chloride  DVT Prophylaxis SCD  Lab Results  Component Value Date   PLT 511* 11/17/2014    Antibiotics   Anti-infectives    Start     Dose/Rate Route Frequency Ordered Stop   11/21/14 1430  meropenem (MERREM) 1 g in sodium chloride 0.9 % 50 mL IVPB  Status:  Discontinued     1 g 100 mL/hr over 30 Minutes Intravenous 3 times per day 11/21/14 1424 11/21/14 1429   11/21/14 1430  meropenem (MERREM) 1 g in sodium chloride 0.9 % 100 mL IVPB     1  g 200 mL/hr over 30 Minutes Intravenous 3 times per day 11/21/14 1429     11/21/14 1315  meropenem (MERREM) 500 mg in sodium chloride 0.9 % 50 mL IVPB  Status:  Discontinued     500 mg 100 mL/hr over 30 Minutes Intravenous Every 12 hours 11/21/14 1302 11/21/14 1424   11/20/14 0200  meropenem (MERREM) 1 g in sodium chloride 0.9 % 50 mL IVPB     1 g 100 mL/hr over 30 Minutes Intravenous 3 times per day 11/19/14 1125 11/20/14 1759          Objective:   Filed Vitals:   11/22/14 0423 11/22/14 0500 11/22/14 0729 11/22/14 0802  BP: 100/62  94/57   Pulse: 78  83   Temp: 97.7 F (36.5 C)  98 F (36.7 C)   TempSrc: Oral  Oral   Resp: 18  18   Height:      Weight:  51.529 kg (113 lb 9.6 oz)    SpO2: 100%  100% 100%    Wt Readings from Last 3 Encounters:  11/22/14 51.529 kg (113 lb 9.6 oz)     Intake/Output Summary (Last 24 hours) at 11/22/14 1153 Last data filed at 11/22/14 0932  Gross per 24 hour  Intake   1040 ml  Output      0 ml  Net   1040 ml     Physical Exam  Awake Alert, Oriented X 3, No new F.N deficits, Normal affect [place].AT,PERRAL Supple Neck,No JVD, No cervical lymphadenopathy appriciated.  Symmetrical Chest wall movement, Good air movement bilaterally, CTAB RRR,No Gallops,Rubs or new Murmurs, No Parasternal Heave +ve B.Sounds, Abd Soft, No tenderness, No organomegaly appriciated, No rebound - guarding or rigidity. No Cyanosis, Clubbing or edema, No new Rash or bruise   Data Review   Micro Results Recent Results (from the past 240 hour(s))  Bronchial Wash Culture     Status: None   Collection Time: 11/14/14  4:30 AM  Result Value Ref Range Status   Micro Text Report   Final       COMMENT                   APPEARS TO BE NORMAL FLORA AT 73 HOURS   GRAM STAIN                FEW WHITE BLOOD CELLS   GRAM STAIN                MODERATE GRAM POSITIVE COCCI IN PAIRS   GRAM STAIN                RARE GRAM NEGATIVE ROD   ANTIBIOTIC                                                         Radiology Reports Dg Chest 2 View  11/21/2014   CLINICAL DATA:  Shortness of breath.  EXAM: CHEST  2 VIEW  COMPARISON:  None.  FINDINGS: Interim extubation and removal of NG tube. Right IJ line in stable position. Heart size stable. Partial clearing of right mid lung atelectatic changes with moderate residual. Continued follow-up chest x-rays recommended to demonstrate clearing. Pleural thickening noted most consistent scarring. Heart size stable. Prior cervical spine fusion .  IMPRESSION: 1. Interim extubation and removal of NG tube. Right IJ line in stable position. 2. Partial clearing of right base atelectatic changes with prominent residual. Continued follow-up chest x-rays recommended to demonstrate complete clearing.   Electronically Signed   By: [name]  Register   On: 11/21/2014 15:43   Ct Chest W Contrast  11/11/2014   CLINICAL DATA:  Evaluate for possible lung mass. History of pneumonia.  EXAM: CT CHEST WITH CONTRAST  TECHNIQUE: Multidetector CT imaging of the chest was performed during intravenous contrast administration.  CONTRAST:  75 cc Omnipaque 300  COMPARISON:  Chest CT 08/29/2014  FINDINGS: Mediastinum/Nodes: Endotracheal tube terminates in the mid trachea. Enteric tube courses inferior to the diaphragm, tip projects in the stomach. No enlarged axillary, mediastinal or hilar lymphadenopathy. Heart is normal in size. No pericardial effusion. There is rightward shift of the mediastinum.  Lungs/Pleura: Suggestion of an intraluminal lesion within the right middle lobe bronchi (image 36; series 3). When compared to prior examination (CT 08/29/2014) there has been near complete total atelectasis of the right middle lobe. Interval development of extensive predominantly peripheral irregular masslike pulmonary consolidation within the right lower lobe. No significant interval change in subpleural nodularity involving the right upper lobe. Small right pleural effusion.  Upper  abdomen: No consolidative or nodular pulmonary opacities. Adrenal glands are unremarkable. No aggressive or acute appearing osseous lesions.  Musculoskeletal: No aggressive or acute appearing osseous lesions. Bilateral breast implants.  IMPRESSION: 1. Near complete atelectasis/consolidation of the right middle lobe. There is a filling defect within the right middle lobe bronchus, likely secondary to mucous plugging. Mass is not entirely excluded. This may be partially responsible for the right middle lobe atelectasis. 2. Extensive predominantly subpleural masslike consolidation within the right lower lobe is nonspecific however may be infectious/inflammatory etiology as this was not present on prior CT 08/29/2014. Recommend short-term followup chest CT in 3-4 weeks after completion of appropriate antibiotic therapy to evaluate for interval improvement and exclude the possibility of underlying mass/neoplasm.   Electronically Signed   By: Lovey Newcomer M.D.   On: 11/11/2014 16:26   Dg Chest Port 1 View  11/14/2014   CLINICAL DATA:  Respiratory failure.  EXAM: PORTABLE CHEST - 1 VIEW  COMPARISON:  CT 11/11/2014.  Chest x-ray 11/11/2014.  FINDINGS: Endotracheal tube, NG tube, left IJ line in stable position. Heart size stable. Pulmonary vascularity is normal. Persistent dense atelectasis and/or infiltrate right lung noted. Persistent right pleural effusion noted. No pneumothorax.  IMPRESSION: 1. Lines and tubes in stable position. 2. Persistent dense right lower lobe atelectasis and/or infiltrate with persistent small right pleural effusion.   Electronically Signed   By: [name]  Register   On: 11/14/2014 07:43   Dg Chest Port 1 View  11/11/2014   CLINICAL DATA:  Central line placement.  EXAM: PORTABLE CHEST - 1 VIEW  COMPARISON:  11/10/2014  FINDINGS: Endotracheal tube tip projects 3.9 cm proximal to the carina. Left IJ catheter tip projects over the distal SVC. NG tube descends below the level of the image. Other  wires and tubes are presumably external. Breast prostheses obscure detailed lung evaluation. Allowing for this, no overt pneumothorax or consolidation on the left. Persistent right middle lobe consolidation is similar. Cardiomediastinal contours are partially obscured. No interval osseous change.  IMPRESSION: Left IJ catheter tip projects over the distal SVC.  No pneumothorax.  Unchanged endotracheal and NG tubes.  Persistent right middle lobe consolidation.   Electronically Signed   By: Carlos Levering M.D.   On: 11/11/2014 01:38   Dg Chest Port 1 View  11/10/2014   CLINICAL DATA:  Post intubation  EXAM: PORTABLE CHEST - 1 VIEW  COMPARISON:  Prior film same day  FINDINGS: Cardiomediastinal silhouette is stable. Hyperinflation again noted. Persistent consolidation in right middle lobe. Endotracheal tube in place with tip 3.6 cm above the carina. No pneumothorax.  IMPRESSION: Hyperinflation again noted. Persistent consolidation in right middle lobe. Endotracheal tube in place with tip 3.6 cm above the carina. No pneumothorax.   Electronically Signed   By: Lahoma Crocker M.D.   On: 11/10/2014 16:11   Dg Chest Port 1 View  11/10/2014   CLINICAL DATA:  Pneumonia, shortness of breath beginning today  EXAM: PORTABLE CHEST - 1 VIEW  COMPARISON:  Portable exam 1422 hours compared to 10/23/2014  FINDINGS: Normal heart size, mediastinal contours and pulmonary vascularity.  Emphysematous changes compatible with COPD.  Increased consolidation RIGHT middle lobe.  Mild volume loss RIGHT chest with mediastinal shift to RIGHT.  Remaining lungs clear.  No pleural effusion or pneumothorax.  Bones demineralized with evidence of prior cervical spine fusion.  BILATERAL breast prostheses.  IMPRESSION: COPD changes with increased consolidation in RIGHT middle lobe; radiographic followup until resolution recommended to exclude underlying abnormalities.  Electronically Signed   By: Lavonia Dana M.D.   On: 11/10/2014 15:04      CBC  Recent Labs Lab 11/16/14 0410 11/17/14 0409  WBC 18.7* 18.0*  HGB 9.0* 9.1*  HCT 29.1* 29.0*  PLT 487* 511*  MCV 86 85  MCH 26.5 26.7  MCHC 30.9* 31.4*  RDW 17.3* 17.8*    Chemistries   Recent Labs Lab 11/16/14 0410 11/17/14 0409 11/18/14 1045  NA 139 137 138  K 4.0 3.3* 3.2*  CL 100* 93* 95*  CO2 34* 38* 36*  GLUCOSE 112* 112* 129*  BUN 33* 20 12  CREATININE 0.43* 0.44 0.49  CALCIUM 8.9 8.8* 8.9   ------------------------------------------------------------------------------------------------------------------ estimated creatinine clearance is 51 mL/min (by C-G formula based on Cr of 0.49). ------------------------------------------------------------------------------------------------------------------ No results for input(s): HGBA1C in the last 72 hours. ------------------------------------------------------------------------------------------------------------------ No results for input(s): CHOL, HDL, LDLCALC, TRIG, CHOLHDL, LDLDIRECT in the last 72 hours. ------------------------------------------------------------------------------------------------------------------ No results for input(s): TSH, T4TOTAL, T3FREE, THYROIDAB in the last 72 hours.  Invalid input(s): FREET3 ------------------------------------------------------------------------------------------------------------------ No results for input(s): VITAMINB12, FOLATE, FERRITIN, TIBC, IRON, RETICCTPCT in the last 72 hours.  Coagulation profile No results for input(s): INR, PROTIME in the last 168 hours.  No results for input(s): DDIMER in the last 72 hours.  Cardiac Enzymes No results for input(s): CKMB, TROPONINI, MYOGLOBIN in the last 168 hours.  Invalid input(s): CK ------------------------------------------------------------------------------------------------------------------ Invalid input(s): POCBNP Admit diagnosis; Acute on chronic respiratory failure Assessment/plan; 1.acute on  chronic respiratory failure due to pneumonia On IV abx/s/p extubation bronchoscopy negative for malignancy PET scan tomorrow for evaluation of RML pneumonia;d/w DR.Kasa 2.anxiety 3.deconditioning'minimal  ambulation for 25yrs;PT recommends SNF Time Spent in minutes   35 min   Anndrea Mihelich M.D on 11/22/2014 at 11:53 AM

## 2014-11-22 NOTE — Progress Notes (Unsigned)
Paged Dr. Vianne Bulls regarding PET Scan. Dr. Informed order was not released and will not be able to perform PET scan today. Dr. Vianne Bulls  Will put in order for PET Scan.

## 2014-11-23 ENCOUNTER — Inpatient Hospital Stay: Payer: BLUE CROSS/BLUE SHIELD

## 2014-11-23 ENCOUNTER — Encounter: Payer: Self-pay | Admitting: Internal Medicine

## 2014-11-23 ENCOUNTER — Other Ambulatory Visit: Payer: Self-pay

## 2014-11-23 DIAGNOSIS — J189 Pneumonia, unspecified organism: Secondary | ICD-10-CM | POA: Diagnosis present

## 2014-11-23 DIAGNOSIS — F411 Generalized anxiety disorder: Secondary | ICD-10-CM | POA: Diagnosis present

## 2014-11-23 LAB — GLUCOSE, CAPILLARY
GLUCOSE-CAPILLARY: 105 mg/dL — AB (ref 70–99)
GLUCOSE-CAPILLARY: 107 mg/dL — AB (ref 70–99)
Glucose-Capillary: 98 mg/dL (ref 70–99)
Glucose-Capillary: 91 mg/dL (ref 70–99)
Glucose-Capillary: 114 mg/dL — ABNORMAL HIGH (ref 70–99)
Glucose-Capillary: 177 mg/dL — ABNORMAL HIGH (ref 70–99)

## 2014-11-23 LAB — CBC
HEMATOCRIT: 32.9 % — AB (ref 35.0–47.0)
HEMOGLOBIN: 10.3 g/dL — AB (ref 12.0–16.0)
MCH: 27.3 pg (ref 26.0–34.0)
MCHC: 31.2 g/dL — ABNORMAL LOW (ref 32.0–36.0)
MCV: 87.4 fL (ref 80.0–100.0)
PLATELETS: 467 10*3/uL — AB (ref 150–440)
RBC: 3.76 MIL/uL — ABNORMAL LOW (ref 3.80–5.20)
RDW: 18.8 % — AB (ref 11.5–14.5)
WBC: 10.2 10*3/uL (ref 3.6–11.0)

## 2014-11-23 LAB — BASIC METABOLIC PANEL
ANION GAP: 5 (ref 5–15)
BUN: 20 mg/dL (ref 6–20)
CALCIUM: 9 mg/dL (ref 8.9–10.3)
CO2: 33 mmol/L — ABNORMAL HIGH (ref 22–32)
Chloride: 101 mmol/L (ref 101–111)
Creatinine, Ser: 0.74 mg/dL (ref 0.44–1.00)
GFR calc Af Amer: >60 mL/min (ref >60)
Glucose, Bld: 112 mg/dL — ABNORMAL HIGH (ref 65–99)
Potassium: 4.5 mmol/L (ref 3.5–5.1)
SODIUM: 139 mmol/L (ref 135–145)

## 2014-11-23 MED ORDER — SODIUM CHLORIDE 0.9 % IJ SOLN: 10.0000 mL | INTRAMUSCULAR | Status: DC | PRN

## 2014-11-23 MED ORDER — FLUDEOXYGLUCOSE F - 18 (FDG) INJECTION
14.2000 | Freq: Once | INTRAVENOUS | Status: AC | PRN
  Administered 2014-11-23: 14.2 via INTRAVENOUS

## 2014-11-23 MED ORDER — SODIUM CHLORIDE 0.9 % IJ SOLN
10.0000 mL | Freq: Two times a day (BID) | INTRAMUSCULAR | Status: DC
  Administered 2014-11-23 – 2014-11-26 (×7): 10 mL

## 2014-11-23 MED ORDER — SODIUM CHLORIDE 0.9 % IJ SOLN
10.0000 mL | INTRAMUSCULAR | Status: DC | PRN
  Administered 2014-11-25: 10 mL
  Filled 2014-11-23: qty 40

## 2014-11-23 MED ORDER — SODIUM CHLORIDE 0.9 % IJ SOLN: 10.0000 mL | Freq: Two times a day (BID) | INTRAMUSCULAR | Status: DC

## 2014-11-23 NOTE — Progress Notes (Signed)
PT Cancellation Note  Patient Details Name: Tiffany Mclean MRN: [id number] DOB: June 22, 1951   Cancelled Treatment:    Reason Eval/Treat Not Completed: Patient at procedure or test/unavailable (Pt gone to imaging)   Leitha Bleak 11/23/2014, 2:11 PM Leitha Bleak, [place]

## 2014-11-23 NOTE — Progress Notes (Addendum)
Nutrition Follow-up  DOCUMENTATION CODES:  INTERVENTION: Medical Nutrition Supplement: ContinueEnsure Enlive (each supplement provides 350kcal and 20 grams of protein) BID Meals and Snacks: Cater to patient preferences  NUTRITION DIAGNOSIS:  Inadequate oral intake related to acute illness as evidenced by energy intake < 75% for > 7 days- improving  GOAL: Energy Intake: Patient will meet greater than or equal to 90% of their needs with meals and supplements  MONITOR:  PO intake, Supplement acceptance, Labs, Weight trends  REASON FOR ASSESSMENT:  Other (Comment) (RD Follow Up)    ASSESSMENT:  Clinical Update: Pt reporting a good appetite Typical Food/ Fluid Intake: >50% of meals recorded per I/O last 24 hrs. Weight Changes: none Labs: Electrolyte and Renal Profile:    Recent Labs Lab 11/17/14 0409 11/18/14 1045 11/23/14 0409  BUN 20 12 20   CREATININE 0.44 0.49 0.74  NA 137 138 139  K 3.3* 3.2* 4.5   Glucose Profile:  Recent Labs  11/23/14 0426 11/23/14 0734 11/23/14 1030  GLUCAP 105* 98 91   Meds: Lipitor, Novolg,   Height:  Ht Readings from Last 1 Encounters:  11/18/14 5' (1.524 m)   Weight:  Wt Readings from Last 1 Encounters:  11/23/14 118 lb 9.6 oz (53.797 kg)   Ideal Body Weight:     Wt Readings from Last 10 Encounters:  11/23/14 118 lb 9.6 oz (53.797 kg)    BMI:  Body mass index is 23.16 kg/(m^2).  Estimated Nutritional Needs:  Kcal:  1209-1450 kcal/ day (BEE: 1007 x 1.2 AF x 1.0-1.2 IF)  Protein:  54-65 g Pro/ day (1.0-1.2 g Pro/ kg/ day )  Fluid:  1350-1620 ml/ day (25-30 ml/ kg)  Skin:  Reviewed, no issues  Diet Order:  Diet NPO time specified  EDUCATION NEEDS:  No education needs identified at this time   Intake/Output Summary (Last 24 hours) at 11/23/14 1140 Last data filed at 11/23/14 7371  Gross per 24 hour  Intake    120 ml  Output    200 ml  Net    -80 ml    Last BM:  4/29  Roda Shutters,  RDN [phone]  MODERATE Care Level

## 2014-11-23 NOTE — Progress Notes (Signed)
Chaplain met with patient and family member, completed follow up visit. [name] 11-23-2014 11:30 .   11/23/14 1130  Clinical Encounter Type  Visited With Patient and family together  Visit Type Follow-up  Referral From Chaplain  Consult/Referral To Chaplain (Follow up with patient and family member)  Spiritual Encounters  Spiritual Needs Prayer;Emotional  Stress Factors  Patient Stress Factors Family relationships;Health changes  Family Stress Factors Family relationships;Health changes;Major life changes  Advance Directives (For Healthcare)  Does patient have an advance directive? Yes

## 2014-11-23 NOTE — Progress Notes (Addendum)
Pt is alert and oriented, VSS, pain improved with PO pain medication. IV antibiotics administered through central line. Sleeping between care, resting quietly with son at bedside, will continue to monitor.

## 2014-11-23 NOTE — Consult Note (Signed)
  Psychiatry: Follow-up for this patient with chronic anxiety. Today the patient reports that she is feeling even better. She is more optimistic and hopeful about her recovery. Strength is improved. Still gets nervous but is not feeling depressed.  On review of systems denies any suicidal or homicidal ideation. No hallucinations. Still describes her pain as being "excruciating" when she tries to stand up but it is controllable when she stays in bed.  On mental status she is neatly groomed. Makes good eye contact. Mild tremor. Speech normal rate tone and volume. Affect slightly anxious. Denies suicidal or homicidal ideation. Alert and oriented 4. Judgment and insight improved.  Diagnosis: Generalized anxiety disorder, depression NOS, delirium largely resolved. No change indicated to medicine. We'll continue to follow as needed.

## 2014-11-23 NOTE — Progress Notes (Signed)
Clinical Education officer, museum (CSW) contacted [employer] today to get the status of the [name] authorization. Per [name] clinicals were faxed in yesterday and they are anticipating an answer today. Patient is agreeable to going home with home health if [employer] denies SNF stay. Per RN patient is going for a PET Scan today at 11 am. CSW will continue to follow and assist as needed.   [name], [place] [phone]

## 2014-11-23 NOTE — Progress Notes (Signed)
Patient Demographics  Tiffany Mclean, is a 64 y.o. female, DOB - [birth year], YDX:[id number]  Admit date - 11/10/2014   Admitting Physician Theodoro Grist, MD  Outpatient Primary MD for the patient is No primary care provider on file.  LOS - 13   No chief complaint on file.       Subjective: Waiting for PET scan.no SOb.has some cough,  Procedures bronchoscopy   Consults  pulmonary   Medications  Scheduled Meds: . ALPRAZolam  0.25 mg Oral 3 times per day  . atorvastatin  40 mg Oral Daily  . budesonide-formoterol  2 puff Inhalation BID  . DULoxetine  90 mg Oral Q24H  . famotidine  20 mg Oral Q24H  . feeding supplement (ENSURE ENLIVE)  237 mL Oral BID AC  . fluticasone  2 spray Each Nare Daily  . guaiFENesin  600 mg Oral BID  . heparin subcutaneous  5,000 Units Subcutaneous 3 times per day  . insulin aspart  0-12 Units Subcutaneous 6 times per day  . ipratropium-albuterol  3 mL Nebulization QID  . meropenem (MERREM) IV  1 g Intravenous 3 times per day  . potassium chloride  20 mEq Oral Daily  . RABEprazole  20 mg Oral QAC breakfast  . sodium chloride  10-30 mL Intravenous Q12H   Continuous Infusions:  PRN Meds:.ipratropium-albuterol, LORazepam, morphine, ondansetron (ZOFRAN) IV, sennosides, sodium chloride  DVT Prophylaxis SCD  Lab Results  Component Value Date   PLT 467* 11/23/2014    Antibiotics   Anti-infectives    Start     Dose/Rate Route Frequency Ordered Stop   11/21/14 1430  meropenem (MERREM) 1 g in sodium chloride 0.9 % 50 mL IVPB  Status:  Discontinued     1 g 100 mL/hr over 30 Minutes Intravenous 3 times per day 11/21/14 1424 11/21/14 1429   11/21/14 1430  meropenem (MERREM) 1 g in sodium chloride 0.9 % 100 mL IVPB     1 g 200 mL/hr over 30 Minutes Intravenous 3 times per  day 11/21/14 1429     11/21/14 1315  meropenem (MERREM) 500 mg in sodium chloride 0.9 % 50 mL IVPB  Status:  Discontinued     500 mg 100 mL/hr over 30 Minutes Intravenous Every 12 hours 11/21/14 1302 11/21/14 1424   11/20/14 0200  meropenem (MERREM) 1 g in sodium chloride 0.9 % 50 mL IVPB     1 g 100 mL/hr over 30 Minutes Intravenous 3 times per day 11/19/14 1125 11/20/14 1759          Objective:   Filed Vitals:   11/22/14 2045 11/23/14 0336 11/23/14 0735 11/23/14 0748  BP:  100/61 97/71   Pulse:  73 78   Temp:  98 F (36.7 C) 98 F (36.7 C)   TempSrc:  Oral Oral   Resp:  18 18   Height:      Weight:  53.797 kg (118 lb 9.6 oz)    SpO2: 96% 98% 100% 99%    Wt Readings from Last 3 Encounters:  11/23/14 53.797 kg (118 lb 9.6 oz)     Intake/Output Summary (Last 24 hours) at 11/23/14 1147 Last data filed at 11/23/14 7209  Gross per 24 hour  Intake  120 ml  Output    200 ml  Net    -80 ml     Physical Exam  Awake Alert, Oriented X 3, No new F.N deficits, Normal affect [place].AT,PERRAL Supple Neck,No JVD, No cervical lymphadenopathy appriciated.  Symmetrical Chest wall movement, Good air movement bilaterally, CTAB RRR,No Gallops,Rubs or new Murmurs, No Parasternal Heave +ve B.Sounds, Abd Soft, No tenderness, No organomegaly appriciated, No rebound - guarding or rigidity. No Cyanosis, Clubbing or edema, No new Rash or bruise   Data Review   Micro Results Recent Results (from the past 240 hour(s))  Bronchial Wash Culture     Status: None   Collection Time: 11/14/14  4:30 AM  Result Value Ref Range Status   Micro Text Report   Final       COMMENT                   APPEARS TO BE NORMAL FLORA AT 67 HOURS   GRAM STAIN                FEW WHITE BLOOD CELLS   GRAM STAIN                MODERATE GRAM POSITIVE COCCI IN PAIRS   GRAM STAIN                RARE GRAM NEGATIVE ROD   ANTIBIOTIC                                                        Radiology Reports Dg  Chest 2 View  11/21/2014   CLINICAL DATA:  Shortness of breath.  EXAM: CHEST  2 VIEW  COMPARISON:  None.  FINDINGS: Interim extubation and removal of NG tube. Right IJ line in stable position. Heart size stable. Partial clearing of right mid lung atelectatic changes with moderate residual. Continued follow-up chest x-rays recommended to demonstrate clearing. Pleural thickening noted most consistent scarring. Heart size stable. Prior cervical spine fusion .  IMPRESSION: 1. Interim extubation and removal of NG tube. Right IJ line in stable position. 2. Partial clearing of right base atelectatic changes with prominent residual. Continued follow-up chest x-rays recommended to demonstrate complete clearing.   Electronically Signed   By: [name]  Register   On: 11/21/2014 15:43   Ct Chest W Contrast  11/11/2014   CLINICAL DATA:  Evaluate for possible lung mass. History of pneumonia.  EXAM: CT CHEST WITH CONTRAST  TECHNIQUE: Multidetector CT imaging of the chest was performed during intravenous contrast administration.  CONTRAST:  75 cc Omnipaque 300  COMPARISON:  Chest CT 08/29/2014  FINDINGS: Mediastinum/Nodes: Endotracheal tube terminates in the mid trachea. Enteric tube courses inferior to the diaphragm, tip projects in the stomach. No enlarged axillary, mediastinal or hilar lymphadenopathy. Heart is normal in size. No pericardial effusion. There is rightward shift of the mediastinum.  Lungs/Pleura: Suggestion of an intraluminal lesion within the right middle lobe bronchi (image 36; series 3). When compared to prior examination (CT 08/29/2014) there has been near complete total atelectasis of the right middle lobe. Interval development of extensive predominantly peripheral irregular masslike pulmonary consolidation within the right lower lobe. No significant interval change in subpleural nodularity involving the right upper lobe. Small right pleural effusion.  Upper abdomen: No consolidative or nodular pulmonary  opacities. Adrenal glands  are unremarkable. No aggressive or acute appearing osseous lesions.  Musculoskeletal: No aggressive or acute appearing osseous lesions. Bilateral breast implants.  IMPRESSION: 1. Near complete atelectasis/consolidation of the right middle lobe. There is a filling defect within the right middle lobe bronchus, likely secondary to mucous plugging. Mass is not entirely excluded. This may be partially responsible for the right middle lobe atelectasis. 2. Extensive predominantly subpleural masslike consolidation within the right lower lobe is nonspecific however may be infectious/inflammatory etiology as this was not present on prior CT 08/29/2014. Recommend short-term followup chest CT in 3-4 weeks after completion of appropriate antibiotic therapy to evaluate for interval improvement and exclude the possibility of underlying mass/neoplasm.   Electronically Signed   By: Lovey Newcomer M.D.   On: 11/11/2014 16:26   Dg Chest Port 1 View  11/14/2014   CLINICAL DATA:  Respiratory failure.  EXAM: PORTABLE CHEST - 1 VIEW  COMPARISON:  CT 11/11/2014.  Chest x-ray 11/11/2014.  FINDINGS: Endotracheal tube, NG tube, left IJ line in stable position. Heart size stable. Pulmonary vascularity is normal. Persistent dense atelectasis and/or infiltrate right lung noted. Persistent right pleural effusion noted. No pneumothorax.  IMPRESSION: 1. Lines and tubes in stable position. 2. Persistent dense right lower lobe atelectasis and/or infiltrate with persistent small right pleural effusion.   Electronically Signed   By: [name]  Register   On: 11/14/2014 07:43   Dg Chest Port 1 View  11/11/2014   CLINICAL DATA:  Central line placement.  EXAM: PORTABLE CHEST - 1 VIEW  COMPARISON:  11/10/2014  FINDINGS: Endotracheal tube tip projects 3.9 cm proximal to the carina. Left IJ catheter tip projects over the distal SVC. NG tube descends below the level of the image. Other wires and tubes are presumably external. Breast  prostheses obscure detailed lung evaluation. Allowing for this, no overt pneumothorax or consolidation on the left. Persistent right middle lobe consolidation is similar. Cardiomediastinal contours are partially obscured. No interval osseous change.  IMPRESSION: Left IJ catheter tip projects over the distal SVC.  No pneumothorax.  Unchanged endotracheal and NG tubes.  Persistent right middle lobe consolidation.   Electronically Signed   By: Carlos Levering M.D.   On: 11/11/2014 01:38   Dg Chest Port 1 View  11/10/2014   CLINICAL DATA:  Post intubation  EXAM: PORTABLE CHEST - 1 VIEW  COMPARISON:  Prior film same day  FINDINGS: Cardiomediastinal silhouette is stable. Hyperinflation again noted. Persistent consolidation in right middle lobe. Endotracheal tube in place with tip 3.6 cm above the carina. No pneumothorax.  IMPRESSION: Hyperinflation again noted. Persistent consolidation in right middle lobe. Endotracheal tube in place with tip 3.6 cm above the carina. No pneumothorax.   Electronically Signed   By: Lahoma Crocker M.D.   On: 11/10/2014 16:11   Dg Chest Port 1 View  11/10/2014   CLINICAL DATA:  Pneumonia, shortness of breath beginning today  EXAM: PORTABLE CHEST - 1 VIEW  COMPARISON:  Portable exam 1422 hours compared to 10/23/2014  FINDINGS: Normal heart size, mediastinal contours and pulmonary vascularity.  Emphysematous changes compatible with COPD.  Increased consolidation RIGHT middle lobe.  Mild volume loss RIGHT chest with mediastinal shift to RIGHT.  Remaining lungs clear.  No pleural effusion or pneumothorax.  Bones demineralized with evidence of prior cervical spine fusion.  BILATERAL breast prostheses.  IMPRESSION: COPD changes with increased consolidation in RIGHT middle lobe; radiographic followup until resolution recommended to exclude underlying abnormalities.   Electronically Signed   By: Elta Guadeloupe  Thornton Papas M.D.   On: 11/10/2014 15:04     CBC  Recent Labs Lab 11/17/14 0409 11/23/14 0409   WBC 18.0* 10.2  HGB 9.1* 10.3*  HCT 29.0* 32.9*  PLT 511* 467*  MCV 85 87.4  MCH 26.7 27.3  MCHC 31.4* 31.2*  RDW 17.8* 18.8*    Chemistries   Recent Labs Lab 11/17/14 0409 11/18/14 1045 11/23/14 0409  NA 137 138 139  K 3.3* 3.2* 4.5  CL 93* 95* 101  CO2 38* 36* 33*  GLUCOSE 112* 129* 112*  BUN 20 12 20   CREATININE 0.44 0.49 0.74  CALCIUM 8.8* 8.9 9.0   ------------------------------------------------------------------------------------------------------------------ estimated creatinine clearance is 51 mL/min (by C-G formula based on Cr of 0.74). ------------------------------------------------------------------------------------------------------------------ No results for input(s): HGBA1C in the last 72 hours. ------------------------------------------------------------------------------------------------------------------ No results for input(s): CHOL, HDL, LDLCALC, TRIG, CHOLHDL, LDLDIRECT in the last 72 hours. ------------------------------------------------------------------------------------------------------------------ No results for input(s): TSH, T4TOTAL, T3FREE, THYROIDAB in the last 72 hours.  Invalid input(s): FREET3 ------------------------------------------------------------------------------------------------------------------ No results for input(s): VITAMINB12, FOLATE, FERRITIN, TIBC, IRON, RETICCTPCT in the last 72 hours.  Coagulation profile No results for input(s): INR, PROTIME in the last 168 hours.  No results for input(s): DDIMER in the last 72 hours.  Cardiac Enzymes No results for input(s): CKMB, TROPONINI, MYOGLOBIN in the last 168 hours.  Invalid input(s): CK ------------------------------------------------------------------------------------------------------------------ Invalid input(s): POCBNP Admit diagnosis; Acute on chronic respiratory failure Assessment/plan; 1.acute on chronic respiratory failure due to pneumonia On IV  abx/s/p extubation bronchoscopy negative for malignancy PET scan today for evaluation of  Recurrent L pneumonia/possible malignancy  2.anxiety 3.deconditioning'minimal  ambulation for 48yrs;PT recommends SNF D/w Son/ Time Spent in minutes   35 min   Carmisha Larusso M.D on 11/23/2014 at 11:47 AM

## 2014-11-24 LAB — GLUCOSE, CAPILLARY
GLUCOSE-CAPILLARY: 100 mg/dL — AB (ref 70–99)
GLUCOSE-CAPILLARY: 99 mg/dL (ref 70–99)
Glucose-Capillary: 118 mg/dL — ABNORMAL HIGH (ref 70–99)
Glucose-Capillary: 159 mg/dL — ABNORMAL HIGH (ref 70–99)
Glucose-Capillary: 112 mg/dL — ABNORMAL HIGH (ref 70–99)
Glucose-Capillary: 115 mg/dL — ABNORMAL HIGH (ref 70–99)

## 2014-11-24 NOTE — Progress Notes (Signed)
PT Cancellation Note  Patient Details Name: Tiffany Mclean MRN: [id number] DOB: 01/26/1951   Cancelled Treatment:    Reason Eval/Treat Not Completed: Other (comment) (Pt just starting to eat lunch.)   Leitha Bleak 11/24/2014, 2:40 PM Leitha Bleak, [place]

## 2014-11-24 NOTE — Outcomes Assessment (Signed)
VSS, patient is A+O with no signs of distress. Pain is controlled with current medications.  IV antibiotics infusing.  Incontinent of urine and no bm this shift.  Son at bedside.  Teds/scd's in place.  Patient is on O2 at 2L

## 2014-11-24 NOTE — Consult Note (Signed)
  Psychiatry: Follow-up for this patient with chronic anxiety. Patient interviewed today and chart reviewed. Patient has no new complaints. She is glad to no longer need the oxygen. She remains anxious about her medical condition but there is no depression or psychosis.  On review of systems generalized prescient denies suicidal ideation and denies any psychosis. Pain continues to be present which she blames on activity yesterday. Continues to feel overall week.  On mental status exam this is a neatly groomed chronically ill looking woman in a hospital room. Pleasant interact with. Good eye contact and normal psychomotor activity. Speech normal rate tone and volume. Affect euthymic reactive and appropriate. Mood stated as okay. Thoughts are loose without loosening of associations. Denies suicidal or homicidal ideation alert and oriented 4.  Patient with chronic anxiety generalized and situational. Continue current dose of Xanax. She also has an order for Ativan but I don't think that's being used since it was only ordered as a when necessary for agitation. Supportive counseling completed. Patient seems to appreciate follow-up. Will continue to see and discuss treatment plan and try and provide some relief from her anxiety.

## 2014-11-24 NOTE — Progress Notes (Signed)
Clinical Social Worker (CSW) met with patient's husband Mr. Bettes and gave him an update. CSW made husband aware that [facility] has started [name] authorization. Husband is agreeable to taking patient home if [employer] denies SNF stay. Husband prefers [facility] care. Per Husband patient has Medicare part A and will get her insurance card in the mail when she turns 38 this month. CSW provided support to patient and number to Medicaid worker Vance Gather at hospital.   Alger Simons Liaison reported that [facility] reference # [id number] and [place] is [employer] case Freight forwarder. Per [name] when patient's Medicare Part A card comes in the mail they can being patient into rehab from the hospital or home as long as she has had a 3 night inpatient qualifying stay in the last 30 days. CSW will continue to follow and assist as needed.   [name], [place] [phone]

## 2014-11-24 NOTE — Progress Notes (Signed)
Patient Demographics  Tiffany Mclean, is a 64 y.o. female, DOB - 09/10/1950, TIW:[id number]  Admit date - 11/10/2014   Admitting Physician Theodoro Grist, MD  Outpatient Primary MD for the patient is No primary care provider on file.  LOS - 14   No chief complaint on file.       Subjective; PET scan is done but the results are pending. Patient has some  mild cough and phlegm but no shortness of breath. Resting well no complaints overnight. At some epistaxis secondary to dry air and also nasal cannula. t the epistaxis resolved on its own.   Procedures bronchoscopy   Consults  pulmonary   Medications  Scheduled Meds: . ALPRAZolam  0.25 mg Oral 3 times per day  . atorvastatin  40 mg Oral Daily  . budesonide-formoterol  2 puff Inhalation BID  . DULoxetine  90 mg Oral Q24H  . famotidine  20 mg Oral Q24H  . feeding supplement (ENSURE ENLIVE)  237 mL Oral BID AC  . fluticasone  2 spray Each Nare Daily  . guaiFENesin  600 mg Oral BID  . heparin subcutaneous  5,000 Units Subcutaneous 3 times per day  . insulin aspart  0-12 Units Subcutaneous 6 times per day  . ipratropium-albuterol  3 mL Nebulization QID  . meropenem (MERREM) IV  1 g Intravenous 3 times per day  . potassium chloride  20 mEq Oral Daily  . RABEprazole  20 mg Oral QAC breakfast  . sodium chloride  10-40 mL Intracatheter Q12H   Continuous Infusions:  PRN Meds:.ipratropium-albuterol, LORazepam, morphine, ondansetron (ZOFRAN) IV, sennosides, sodium chloride  DVT Prophylaxis SCD  Lab Results  Component Value Date   PLT 467* 11/23/2014    Antibiotics   Anti-infectives    Start     Dose/Rate Route Frequency Ordered Stop   11/21/14 1430  meropenem (MERREM) 1 g in sodium chloride 0.9 % 50 mL IVPB  Status:  Discontinued     1 g 100 mL/hr  over 30 Minutes Intravenous 3 times per day 11/21/14 1424 11/21/14 1429   11/21/14 1430  meropenem (MERREM) 1 g in sodium chloride 0.9 % 100 mL IVPB     1 g 200 mL/hr over 30 Minutes Intravenous 3 times per day 11/21/14 1429     11/21/14 1315  meropenem (MERREM) 500 mg in sodium chloride 0.9 % 50 mL IVPB  Status:  Discontinued     500 mg 100 mL/hr over 30 Minutes Intravenous Every 12 hours 11/21/14 1302 11/21/14 1424   11/20/14 0200  meropenem (MERREM) 1 g in sodium chloride 0.9 % 50 mL IVPB     1 g 100 mL/hr over 30 Minutes Intravenous 3 times per day 11/19/14 1125 11/20/14 1759          Objective:   Filed Vitals:   11/24/14 0412 11/24/14 0448 11/24/14 0737 11/24/14 0830  BP: 111/60  104/63   Pulse: 79  75   Temp: 97.9 F (36.6 C)  98.4 F (36.9 C)   TempSrc: Oral  Oral   Resp: 18  18   Height:      Weight:  49.805 kg (109 lb 12.8 oz)    SpO2: 99%  98% 98%    Wt Readings  from Last 3 Encounters:  11/24/14 49.805 kg (109 lb 12.8 oz)     Intake/Output Summary (Last 24 hours) at 11/24/14 1031 Last data filed at 11/24/14 0900  Gross per 24 hour  Intake      0 ml  Output    300 ml  Net   -300 ml     Physical Exam  Awake Alert, Oriented X 3, No new F.N deficits, Normal affect [place].AT,PERRAL Supple Neck,No JVD, No cervical lymphadenopathy appriciated.  Symmetrical Chest wall movement, Good air movement bilaterally, expiratory wheeze in the basal lung fields bilaterally. RRR,No Gallops,Rubs or new Murmurs, No Parasternal Heave +ve B.Sounds, Abd Soft, No tenderness, No organomegaly appriciated, No rebound - guarding or rigidity. No Cyanosis, Clubbing or edema, No new Rash or bruise   Data Review   Micro Results No results found for this or any previous visit (from the past 240 hour(s)).  Radiology Reports Dg Chest 2 View  11/21/2014   CLINICAL DATA:  Shortness of breath.  EXAM: CHEST  2 VIEW  COMPARISON:  None.  FINDINGS: Interim extubation and removal of NG tube.  Right IJ line in stable position. Heart size stable. Partial clearing of right mid lung atelectatic changes with moderate residual. Continued follow-up chest x-rays recommended to demonstrate clearing. Pleural thickening noted most consistent scarring. Heart size stable. Prior cervical spine fusion .  IMPRESSION: 1. Interim extubation and removal of NG tube. Right IJ line in stable position. 2. Partial clearing of right base atelectatic changes with prominent residual. Continued follow-up chest x-rays recommended to demonstrate complete clearing.   Electronically Signed   By: [name]  Register   On: 11/21/2014 15:43   Ct Chest W Contrast  11/11/2014   CLINICAL DATA:  Evaluate for possible lung mass. History of pneumonia.  EXAM: CT CHEST WITH CONTRAST  TECHNIQUE: Multidetector CT imaging of the chest was performed during intravenous contrast administration.  CONTRAST:  75 cc Omnipaque 300  COMPARISON:  Chest CT 08/29/2014  FINDINGS: Mediastinum/Nodes: Endotracheal tube terminates in the mid trachea. Enteric tube courses inferior to the diaphragm, tip projects in the stomach. No enlarged axillary, mediastinal or hilar lymphadenopathy. Heart is normal in size. No pericardial effusion. There is rightward shift of the mediastinum.  Lungs/Pleura: Suggestion of an intraluminal lesion within the right middle lobe bronchi (image 36; series 3). When compared to prior examination (CT 08/29/2014) there has been near complete total atelectasis of the right middle lobe. Interval development of extensive predominantly peripheral irregular masslike pulmonary consolidation within the right lower lobe. No significant interval change in subpleural nodularity involving the right upper lobe. Small right pleural effusion.  Upper abdomen: No consolidative or nodular pulmonary opacities. Adrenal glands are unremarkable. No aggressive or acute appearing osseous lesions.  Musculoskeletal: No aggressive or acute appearing osseous lesions.  Bilateral breast implants.  IMPRESSION: 1. Near complete atelectasis/consolidation of the right middle lobe. There is a filling defect within the right middle lobe bronchus, likely secondary to mucous plugging. Mass is not entirely excluded. This may be partially responsible for the right middle lobe atelectasis. 2. Extensive predominantly subpleural masslike consolidation within the right lower lobe is nonspecific however may be infectious/inflammatory etiology as this was not present on prior CT 08/29/2014. Recommend short-term followup chest CT in 3-4 weeks after completion of appropriate antibiotic therapy to evaluate for interval improvement and exclude the possibility of underlying mass/neoplasm.   Electronically Signed   By: Lovey Newcomer M.D.   On: 11/11/2014 16:26   Dg Chest [facility]  1 View  11/14/2014   CLINICAL DATA:  Respiratory failure.  EXAM: PORTABLE CHEST - 1 VIEW  COMPARISON:  CT 11/11/2014.  Chest x-ray 11/11/2014.  FINDINGS: Endotracheal tube, NG tube, left IJ line in stable position. Heart size stable. Pulmonary vascularity is normal. Persistent dense atelectasis and/or infiltrate right lung noted. Persistent right pleural effusion noted. No pneumothorax.  IMPRESSION: 1. Lines and tubes in stable position. 2. Persistent dense right lower lobe atelectasis and/or infiltrate with persistent small right pleural effusion.   Electronically Signed   By: [name]  Register   On: 11/14/2014 07:43   Dg Chest Port 1 View  11/11/2014   CLINICAL DATA:  Central line placement.  EXAM: PORTABLE CHEST - 1 VIEW  COMPARISON:  11/10/2014  FINDINGS: Endotracheal tube tip projects 3.9 cm proximal to the carina. Left IJ catheter tip projects over the distal SVC. NG tube descends below the level of the image. Other wires and tubes are presumably external. Breast prostheses obscure detailed lung evaluation. Allowing for this, no overt pneumothorax or consolidation on the left. Persistent right middle lobe consolidation is  similar. Cardiomediastinal contours are partially obscured. No interval osseous change.  IMPRESSION: Left IJ catheter tip projects over the distal SVC.  No pneumothorax.  Unchanged endotracheal and NG tubes.  Persistent right middle lobe consolidation.   Electronically Signed   By: Carlos Levering M.D.   On: 11/11/2014 01:38   Dg Chest Port 1 View  11/10/2014   CLINICAL DATA:  Post intubation  EXAM: PORTABLE CHEST - 1 VIEW  COMPARISON:  Prior film same day  FINDINGS: Cardiomediastinal silhouette is stable. Hyperinflation again noted. Persistent consolidation in right middle lobe. Endotracheal tube in place with tip 3.6 cm above the carina. No pneumothorax.  IMPRESSION: Hyperinflation again noted. Persistent consolidation in right middle lobe. Endotracheal tube in place with tip 3.6 cm above the carina. No pneumothorax.   Electronically Signed   By: Lahoma Crocker M.D.   On: 11/10/2014 16:11   Dg Chest Port 1 View  11/10/2014   CLINICAL DATA:  Pneumonia, shortness of breath beginning today  EXAM: PORTABLE CHEST - 1 VIEW  COMPARISON:  Portable exam 1422 hours compared to 10/23/2014  FINDINGS: Normal heart size, mediastinal contours and pulmonary vascularity.  Emphysematous changes compatible with COPD.  Increased consolidation RIGHT middle lobe.  Mild volume loss RIGHT chest with mediastinal shift to RIGHT.  Remaining lungs clear.  No pleural effusion or pneumothorax.  Bones demineralized with evidence of prior cervical spine fusion.  BILATERAL breast prostheses.  IMPRESSION: COPD changes with increased consolidation in RIGHT middle lobe; radiographic followup until resolution recommended to exclude underlying abnormalities.   Electronically Signed   By: Lavonia Dana M.D.   On: 11/10/2014 15:04     CBC  Recent Labs Lab 11/23/14 0409  WBC 10.2  HGB 10.3*  HCT 32.9*  PLT 467*  MCV 87.4  MCH 27.3  MCHC 31.2*  RDW 18.8*    Chemistries   Recent Labs Lab 11/18/14 1045 11/23/14 0409  NA 138 139  K  3.2* 4.5  CL 95* 101  CO2 36* 33*  GLUCOSE 129* 112*  BUN 12 20  CREATININE 0.49 0.74  CALCIUM 8.9 9.0   ------------------------------------------------------------------------------------------------------------------ estimated creatinine clearance is 51 mL/min (by C-G formula based on Cr of 0.74). ------------------------------------------------------------------------------------------------------------------ No results for input(s): HGBA1C in the last 72 hours. ------------------------------------------------------------------------------------------------------------------ No results for input(s): CHOL, HDL, LDLCALC, TRIG, CHOLHDL, LDLDIRECT in the last 72 hours. ------------------------------------------------------------------------------------------------------------------ No results for input(s): TSH,  T4TOTAL, T3FREE, THYROIDAB in the last 72 hours.  Invalid input(s): FREET3 ------------------------------------------------------------------------------------------------------------------ No results for input(s): VITAMINB12, FOLATE, FERRITIN, TIBC, IRON, RETICCTPCT in the last 72 hours.  Coagulation profile No results for input(s): INR, PROTIME in the last 168 hours.  No results for input(s): DDIMER in the last 72 hours.  Cardiac Enzymes No results for input(s): CKMB, TROPONINI, MYOGLOBIN in the last 168 hours.  Invalid input(s): CK ------------------------------------------------------------------------------------------------------------------ Invalid input(s): POCBNP Admit diagnosis; Acute on chronic respiratory failure Assessment/plan; 1.acute on chronic respiratory failure due to pneumonia On IV abx/s/p extubation, bronchoscopy negative for malignancy. Discussed with Dr. Mortimer Fries  will continue another 2 more days of IV meropenem.,PET scan results are pending . COPD: Continue her on nebulizer, Symbicort.  2.anxiety; controlled. Continue her on Cymbalta. He is also on  Xanax continue that. 3.deconditioning'minimal  ambulation for 78yrs;PT recommends SNF. Ending Ship broker. D/w Son/ Time Spent in minutes   35 min   Belma Dyches M.D on 11/24/2014 at 10:31 AM

## 2014-11-25 LAB — GLUCOSE, CAPILLARY
GLUCOSE-CAPILLARY: 97 mg/dL (ref 70–99)
GLUCOSE-CAPILLARY: 126 mg/dL — AB (ref 70–99)
Glucose-Capillary: 105 mg/dL — ABNORMAL HIGH (ref 70–99)
Glucose-Capillary: 106 mg/dL — ABNORMAL HIGH (ref 70–99)
Glucose-Capillary: 146 mg/dL — ABNORMAL HIGH (ref 70–99)
Glucose-Capillary: 95 mg/dL (ref 70–99)

## 2014-11-25 NOTE — Care Management Note (Signed)
Case Management Note  Patient Details  Name: GREIDY SHERARD MRN: [id number] Date of Birth: 02-Oct-1950  [facility] screened patient's chart for LTAC- Medicare is not showing as primary. [employer] requires 20% daily deductible along with meeting ~$4K out of pocket.  [phone] B (Part A only). CSW working for SNF discharge over the weekend.   Marshell Garfinkel, RN 11/25/2014, 2:57 PM

## 2014-11-25 NOTE — Outcomes Assessment (Signed)
VSS, patient is A+O with no signs of distress.  Pain is controlled with current medications.  Receiving iv antibiotics.  Turned q2h.  Appears to have slept well without O2 with sats in the low to mid 90's.  Lungs dimished but clear. Son at bedside.

## 2014-11-25 NOTE — Progress Notes (Signed)
Patient Demographics  Tiffany [facility], is a 64 y.o. female, DOB - [birth year], YNX:[id number]  Admit date - 11/10/2014   Admitting Physician Theodoro Grist, MD  Outpatient Primary MD for the patient is No primary care provider on file.  LOS - 15   No chief complaint on file.     SUBJECTIVE; the patient seen today, has some deep cough and phlegm. Patient became hypoxic probably this morning. The patient has no shortness of breath. He she want to get further doses of IV antibiotics because of her recurrent pneumonia. Her PET scan results showing increased activity in the lungs at the places where she had pneumonia.    Procedures bronchoscopy   Consults  pulmonary   Medications  Scheduled Meds: . ALPRAZolam  0.25 mg Oral 3 times per day  . atorvastatin  40 mg Oral Daily  . budesonide-formoterol  2 puff Inhalation BID  . DULoxetine  90 mg Oral Q24H  . famotidine  20 mg Oral Q24H  . feeding supplement (ENSURE ENLIVE)  237 mL Oral BID AC  . fluticasone  2 spray Each Nare Daily  . guaiFENesin  600 mg Oral BID  . heparin subcutaneous  5,000 Units Subcutaneous 3 times per day  . insulin aspart  0-12 Units Subcutaneous 6 times per day  . ipratropium-albuterol  3 mL Nebulization QID  . meropenem (MERREM) IV  1 g Intravenous 3 times per day  . potassium chloride  20 mEq Oral Daily  . RABEprazole  20 mg Oral QAC breakfast  . sodium chloride  10-40 mL Intracatheter Q12H   Continuous Infusions:  PRN Meds:.ipratropium-albuterol, LORazepam, morphine, ondansetron (ZOFRAN) IV, sennosides, sodium chloride  DVT Prophylaxis SCD  Lab Results  Component Value Date   PLT 467* 11/23/2014    Antibiotics   Anti-infectives    Start     Dose/Rate Route Frequency Ordered Stop   11/21/14 1430  meropenem (MERREM) 1 g in  sodium chloride 0.9 % 50 mL IVPB  Status:  Discontinued     1 g 100 mL/hr over 30 Minutes Intravenous 3 times per day 11/21/14 1424 11/21/14 1429   11/21/14 1430  meropenem (MERREM) 1 g in sodium chloride 0.9 % 100 mL IVPB     1 g 200 mL/hr over 30 Minutes Intravenous 3 times per day 11/21/14 1429     11/21/14 1315  meropenem (MERREM) 500 mg in sodium chloride 0.9 % 50 mL IVPB  Status:  Discontinued     500 mg 100 mL/hr over 30 Minutes Intravenous Every 12 hours 11/21/14 1302 11/21/14 1424   11/20/14 0200  meropenem (MERREM) 1 g in sodium chloride 0.9 % 50 mL IVPB     1 g 100 mL/hr over 30 Minutes Intravenous 3 times per day 11/19/14 1125 11/20/14 1759          Objective:   Filed Vitals:   11/25/14 0445 11/25/14 0538 11/25/14 0732 11/25/14 0831  BP: 96/43  112/62   Pulse: 85  73   Temp: 98 F (36.7 C)  97.7 F (36.5 C)   TempSrc: Oral  Oral   Resp: 18  16   Height:      Weight:  47.038 kg (103 lb 11.2 oz)    SpO2:  93%  100% 100%    Wt Readings from Last 3 Encounters:  11/25/14 47.038 kg (103 lb 11.2 oz)     Intake/Output Summary (Last 24 hours) at 11/25/14 1122 Last data filed at 11/25/14 0800  Gross per 24 hour  Intake   1020 ml  Output      0 ml  Net   1020 ml     Physical Exam  Awake Alert, Oriented X 3, No new F.N deficits, Normal affect [place].AT,PERRAL Supple Neck,No JVD, No cervical lymphadenopathy appriciated.  Symmetrical Chest wall movement, Good air movement bilaterally, expiratory wheeze in the basal lung fields bilaterally. RRR,No Gallops,Rubs or new Murmurs, No Parasternal Heave +ve B.Sounds, Abd Soft, No tenderness, No organomegaly appriciated, No rebound - guarding or rigidity. No Cyanosis, Clubbing or edema, No new Rash or bruise   Data Review   Micro Results No results found for this or any previous visit (from the past 240 hour(s)).  Radiology Reports Dg Chest 2 View  11/21/2014   CLINICAL DATA:  Shortness of breath.  EXAM: CHEST  2 VIEW   COMPARISON:  None.  FINDINGS: Interim extubation and removal of NG tube. Right IJ line in stable position. Heart size stable. Partial clearing of right mid lung atelectatic changes with moderate residual. Continued follow-up chest x-rays recommended to demonstrate clearing. Pleural thickening noted most consistent scarring. Heart size stable. Prior cervical spine fusion .  IMPRESSION: 1. Interim extubation and removal of NG tube. Right IJ line in stable position. 2. Partial clearing of right base atelectatic changes with prominent residual. Continued follow-up chest x-rays recommended to demonstrate complete clearing.   Electronically Signed   By: [name]  Register   On: 11/21/2014 15:43   Ct Chest W Contrast  11/11/2014   CLINICAL DATA:  Evaluate for possible lung mass. History of pneumonia.  EXAM: CT CHEST WITH CONTRAST  TECHNIQUE: Multidetector CT imaging of the chest was performed during intravenous contrast administration.  CONTRAST:  75 cc Omnipaque 300  COMPARISON:  Chest CT 08/29/2014  FINDINGS: Mediastinum/Nodes: Endotracheal tube terminates in the mid trachea. Enteric tube courses inferior to the diaphragm, tip projects in the stomach. No enlarged axillary, mediastinal or hilar lymphadenopathy. Heart is normal in size. No pericardial effusion. There is rightward shift of the mediastinum.  Lungs/Pleura: Suggestion of an intraluminal lesion within the right middle lobe bronchi (image 36; series 3). When compared to prior examination (CT 08/29/2014) there has been near complete total atelectasis of the right middle lobe. Interval development of extensive predominantly peripheral irregular masslike pulmonary consolidation within the right lower lobe. No significant interval change in subpleural nodularity involving the right upper lobe. Small right pleural effusion.  Upper abdomen: No consolidative or nodular pulmonary opacities. Adrenal glands are unremarkable. No aggressive or acute appearing osseous  lesions.  Musculoskeletal: No aggressive or acute appearing osseous lesions. Bilateral breast implants.  IMPRESSION: 1. Near complete atelectasis/consolidation of the right middle lobe. There is a filling defect within the right middle lobe bronchus, likely secondary to mucous plugging. Mass is not entirely excluded. This may be partially responsible for the right middle lobe atelectasis. 2. Extensive predominantly subpleural masslike consolidation within the right lower lobe is nonspecific however may be infectious/inflammatory etiology as this was not present on prior CT 08/29/2014. Recommend short-term followup chest CT in 3-4 weeks after completion of appropriate antibiotic therapy to evaluate for interval improvement and exclude the possibility of underlying mass/neoplasm.   Electronically Signed   By: Polly Cobia.D.  On: 11/11/2014 16:26   Nm Pet Image Initial (pi) Whole Body  11/24/2014   CLINICAL DATA:  Initial Treatment strategy for lung mass . History of squamous cell carcinoma of the vagina.  EXAM: NUCLEAR MEDICINE PET WHOLE BODY  TECHNIQUE: 14.2 mCi F-18 FDG was injected intravenously. Full-ring PET imaging was performed from the vertex to the feet after the radiotracer. CT data was obtained and used for attenuation correction and anatomic localization.  FASTING BLOOD GLUCOSE:  Value:  107 mg/dl  COMPARISON:  11/11/2014  FINDINGS: Head/Neck: No hypermetabolic lymph nodes in the neck.  Chest: A band of density in the right lower lobe anteriorly has a maximum standard uptake value of 4.7. Atelectatic right lower lobe peripheral to this has a standard uptake value in the 2.8 range.  No enlarged pole pulmonary lymph nodes are identified. Faintly hypermetabolic but tiny right upper internal mammary node maximum standard uptake value 2.5. Small subcarinal lymph node maximum standard uptake value 3.2. Background mediastinal activity approximately 3.0.  Emphysema noted. Bilateral breast implants. Anterior  mediastinal lymph node measuring 0.7 cm in short axis is not hypermetabolic.  Trace right pleural effusion.  Abdomen/Pelvis: Gastric fundal activity is probably physiologic. There is physiologic activity in several loops of bowel.  Cholecystectomy. Aortoiliac atherosclerotic vascular disease. Uterus not well seen, presumed hysterectomy.  Skeleton: No focal hypermetabolic activity to suggest skeletal metastasis.  Extremities: No hypermetabolic activity to suggest metastasis.  IMPRESSION: 1. Bands of volume loss and atelectasis in the right middle lobe and right lower lobe. One of the anterior bands in the right lower lobe is hypermetabolic with maximum standard uptake value 4.7, which could be due to an underlying malignancy obscured by surrounding atelectatic lung, or granulomatous process in this region. However, we do not observe hypermetabolic activity in the vicinity of the occluded right middle lobe bronchus, which is indeed still occluded proximally. Bronchoscopy may be warranted if not already performed. 2. Emphysema. 3. There several faintly hypermetabolic lymph nodes including a small right upper internal mammary node and a small subcarinal node, but these are not really appreciably above background mediastinal activity and may simply be reactive. 4. Trace right pleural effusion. 5. There is diffuse activity in the nondistended gastric fundus, which is nonspecific and may be physiologic. 6.  Aortoiliac atherosclerotic vascular disease.   Electronically Signed   By: Van Clines M.D.   On: 11/24/2014 17:29   Dg Chest Port 1 View  11/14/2014   CLINICAL DATA:  Respiratory failure.  EXAM: PORTABLE CHEST - 1 VIEW  COMPARISON:  CT 11/11/2014.  Chest x-ray 11/11/2014.  FINDINGS: Endotracheal tube, NG tube, left IJ line in stable position. Heart size stable. Pulmonary vascularity is normal. Persistent dense atelectasis and/or infiltrate right lung noted. Persistent right pleural effusion noted. No  pneumothorax.  IMPRESSION: 1. Lines and tubes in stable position. 2. Persistent dense right lower lobe atelectasis and/or infiltrate with persistent small right pleural effusion.   Electronically Signed   By: [name]  Register   On: 11/14/2014 07:43   Dg Chest Port 1 View  11/11/2014   CLINICAL DATA:  Central line placement.  EXAM: PORTABLE CHEST - 1 VIEW  COMPARISON:  11/10/2014  FINDINGS: Endotracheal tube tip projects 3.9 cm proximal to the carina. Left IJ catheter tip projects over the distal SVC. NG tube descends below the level of the image. Other wires and tubes are presumably external. Breast prostheses obscure detailed lung evaluation. Allowing for this, no overt pneumothorax or consolidation on the left. Persistent  right middle lobe consolidation is similar. Cardiomediastinal contours are partially obscured. No interval osseous change.  IMPRESSION: Left IJ catheter tip projects over the distal SVC.  No pneumothorax.  Unchanged endotracheal and NG tubes.  Persistent right middle lobe consolidation.   Electronically Signed   By: Carlos Levering M.D.   On: 11/11/2014 01:38   Dg Chest Port 1 View  11/10/2014   CLINICAL DATA:  Post intubation  EXAM: PORTABLE CHEST - 1 VIEW  COMPARISON:  Prior film same day  FINDINGS: Cardiomediastinal silhouette is stable. Hyperinflation again noted. Persistent consolidation in right middle lobe. Endotracheal tube in place with tip 3.6 cm above the carina. No pneumothorax.  IMPRESSION: Hyperinflation again noted. Persistent consolidation in right middle lobe. Endotracheal tube in place with tip 3.6 cm above the carina. No pneumothorax.   Electronically Signed   By: Lahoma Crocker M.D.   On: 11/10/2014 16:11   Dg Chest Port 1 View  11/10/2014   CLINICAL DATA:  Pneumonia, shortness of breath beginning today  EXAM: PORTABLE CHEST - 1 VIEW  COMPARISON:  Portable exam 1422 hours compared to 10/23/2014  FINDINGS: Normal heart size, mediastinal contours and pulmonary vascularity.   Emphysematous changes compatible with COPD.  Increased consolidation RIGHT middle lobe.  Mild volume loss RIGHT chest with mediastinal shift to RIGHT.  Remaining lungs clear.  No pleural effusion or pneumothorax.  Bones demineralized with evidence of prior cervical spine fusion.  BILATERAL breast prostheses.  IMPRESSION: COPD changes with increased consolidation in RIGHT middle lobe; radiographic followup until resolution recommended to exclude underlying abnormalities.   Electronically Signed   By: Lavonia Dana M.D.   On: 11/10/2014 15:04     CBC  Recent Labs Lab 11/23/14 0409  WBC 10.2  HGB 10.3*  HCT 32.9*  PLT 467*  MCV 87.4  MCH 27.3  MCHC 31.2*  RDW 18.8*    Chemistries   Recent Labs Lab 11/23/14 0409  NA 139  K 4.5  CL 101  CO2 33*  GLUCOSE 112*  BUN 20  CREATININE 0.74  CALCIUM 9.0   ------------------------------------------------------------------------------------------------------------------ estimated creatinine clearance is 51 mL/min (by C-G formula based on Cr of 0.74). ------------------------------------------------------------------------------------------------------------------ No results for input(s): HGBA1C in the last 72 hours. ------------------------------------------------------------------------------------------------------------------ No results for input(s): CHOL, HDL, LDLCALC, TRIG, CHOLHDL, LDLDIRECT in the last 72 hours. ------------------------------------------------------------------------------------------------------------------ No results for input(s): TSH, T4TOTAL, T3FREE, THYROIDAB in the last 72 hours.  Invalid input(s): FREET3 ------------------------------------------------------------------------------------------------------------------ No results for input(s): VITAMINB12, FOLATE, FERRITIN, TIBC, IRON, RETICCTPCT in the last 72 hours.  Coagulation profile No results for input(s): INR, PROTIME in the last 168 hours.  No  results for input(s): DDIMER in the last 72 hours.  Cardiac Enzymes No results for input(s): CKMB, TROPONINI, MYOGLOBIN in the last 168 hours.  Invalid input(s): CK ------------------------------------------------------------------------------------------------------------------ Invalid input(s): POCBNP Admit diagnosis; Acute on chronic respiratory failure Assessment/plan; 1.acute on chronic respiratory failure due to pneumonia On IV abx/s/p extubation,/continue meropenem  For extended coverage, bronchoscopy negative for malignancy. Discussed with Dr. Mortimer Fries  COPD: Continue her on nebulizer, Symbicort. PET Results d/w pt,  2.anxiety; controlled. Continue her on Cymbalta. He is also on Xanax continue that.  3.deconditioning'minimal  ambulation for 2yrs;PT recommends SNF. [facility] d/c on Monday.continue M<eropenam till Sunday,change to  levaquin at d/c Time Spent in minutes   59 min   Umair Rosiles M.D on 11/25/2014 at 11:22 AM

## 2014-11-25 NOTE — Progress Notes (Signed)
Physical Therapy Treatment Patient Details Name: Tiffany Mclean MRN: [id number] DOB: [birth year] Today's Date: 11/25/2014    History of Present Illness Pt is a pleasant 64 year old female who was admitted for acute respiratory failure. Pt on the ventilator 11/09/13-11/16/14. Pt with multiple admissions for acute respiratory failure and now complaints of dyspnea. Pt with history of fibromyalgia, COPD, GERD, and now questionable R LL infiltrate and had PET scan performed.    PT Comments    Pt demonstrating decreased posterior lean with standing and taking steps to recliner with RW but mobility limited d/t pt's c/o significant SOB and difficulty catching her breath with activity (improved within a few minutes of sitting rest and O2 saturations >95% on 2 L/min via nasal cannula).  Pt with lower BP today and monitored during session (see below vitals section).  Pt would benefit from continued PT for LE ROM, LE strengthening, stretching, balance, activity tolerance, and progressive functional mobility per pt tolerance.  Recommend pt discharge to STR when medically appropriate.  Follow Up Recommendations  SNF     Equipment Recommendations  Other (comment) (ramp to enter home)    Recommendations for Other Services       Precautions / Restrictions Precautions Precautions: Fall Precaution Comments: decreased activity tolerance Restrictions Weight Bearing Restrictions: No    Mobility  Bed Mobility Overal bed mobility: Needs Assistance Bed Mobility: Supine to Sit     Supine to sit: Supervision;HOB elevated     General bed mobility comments: sitting rest break on EOB d/t SOB  Transfers Overall transfer level: Needs assistance Equipment used: Rolling walker (2 wheeled) Transfers: Sit to/from [facility] Sit to Stand: Min assist Stand pivot transfers: Min assist;Mod assist       General transfer comment: mild posterior lean with standing (d/t tight heelcords); R  knee partially flexed (pt with h/o R knee issues)  Ambulation/Gait Ambulation/Gait assistance: Min assist;Mod assist Ambulation Distance (Feet): 5 Feet Assistive device: Rolling walker (2 wheeled) Gait Pattern/deviations: Decreased stance time - right;Decreased stride length;Decreased dorsiflexion - right;Decreased dorsiflexion - left;Decreased weight shift to right;Antalgic     General Gait Details: decreased stance time R LE; R knee partially flexed; posterior lean d/t tight heelcords; limited d/t significant SOB   Stairs            Wheelchair Mobility    Modified Rankin (Stroke Patients Only)       Balance Overall balance assessment: Needs assistance         Standing balance support: Bilateral upper extremity supported (posterior lean)                        Cognition Arousal/Alertness: Awake/alert Behavior During Therapy: WFL for tasks assessed/performed Overall Cognitive Status: Within Functional Limits for tasks assessed                      Exercises      General Comments  Nursing cleared pt to participate in physical therapy (pt with BP 92/53).  Pt requiring pacing and rest breaks with activity d/t significant SOB.  Pt's vitals (especially BP) monitored through session.      Pertinent Vitals/Pain Pain Assessment: 0-10 Pain Score: 5  Pain Location: R knee and heelcords with standing Pain Intervention(s): Limited activity within patient's tolerance;Monitored during session;Repositioned  Vitals:  Supine BP 92/53, HR 98 bpm, and O2 98% on 2 L/min via nasal cannula  Sitting BP 99/57, HR 91 bpm, and O2 97% on 2 L/min via nasal cannula             Standing BP 110/73, HR 114 bpm, and O2 97% on 2 L/min via nasal cannula             After 5 minutes sitting in recliner: BP 90/54, HR 94 bpm, and O2 96% on 2 L/min via nasal cannula  Nursing notified immediately after session regarding all of pt's vitals.  No c/o dizziness/lightheadedness  throughout session.    Home Living                      Prior Function            PT Goals (current goals can now be found in the care plan section) Progress towards PT goals: Progressing toward goals    Frequency  Min 2X/week    PT Plan Current plan remains appropriate    Co-evaluation             End of Session Equipment Utilized During Treatment: Gait belt Activity Tolerance: Patient limited by fatigue;Other (comment) (Limited d/t significant SOB with activity) Patient left: in chair;with call bell/phone within reach;with chair alarm set     Time: 7282-0601 PT Time Calculation (min) (ACUTE ONLY): 23 min  Charges:  $Therapeutic Activity: 23-37 mins                    G CodesLeitha Bleak 2014/12/25, 4:27 PM Leitha Bleak, [place]

## 2014-11-25 NOTE — Progress Notes (Signed)
Per [facility] Liaison patient's [place] authorized SNF stay. Per Tiffany [facility] authorization is good through the weekend. Patient also has Medicare Part A: [id number]. Tiffany [name] confirmed that patient has Medicare Part A. Per MD patient will likely D/C Sunday or Monday. Per Tiffany [name] at [facility] patient can come over the weekend or on Monday. Per Tiffany [name] no orders are needed today. Weekend CSW can fax D/C summary to [phone] at [facility] if patient discharges on over weekend.   Tiffany Mclean, [place] [phone]

## 2014-11-25 NOTE — Progress Notes (Signed)
ANTIBIOTIC CONSULT NOTE - FOLLOW UP  Pharmacy Consult for Meropenem Indication: HCAP  Allergies  Allergen Reactions  . Asa [Aspirin]   . Avelox [Moxifloxacin]   . Cephalexin   . Codeine   . Erythromycin Ethylsuccinate   . Flagyl [Metronidazole]   . Latex   . Levaquin [Levofloxacin] Hives  . Meloxicam   . Shellfish Allergy Nausea And Vomiting and Other (See Comments)    Dizzy, fainting  . Sulfa Antibiotics   . Topiramate   . Tetracyclines & Related Rash    Patient Measurements: Height: 5' (152.4 cm) Weight: 103 lb 11.2 oz (47.038 kg) IBW/kg (Calculated) : 45.5   Vital Signs: Temp: 97.7 F (36.5 C) (05/06 0732) Temp Source: Oral (05/06 0732) BP: 112/62 mmHg (05/06 0732) Pulse Rate: 73 (05/06 0732)  Labs:  Recent Labs  11/23/14 0409  WBC 10.2  HGB 10.3*  PLT 467*  CREATININE 0.74   Estimated Creatinine Clearance: 51 mL/min (by C-G formula based on Cr of 0.74).   Microbiology: Culture, blood (single)     Status: None   Collection Time: 11/10/14  2:15 PM  Result Value Ref Range Status   Micro Text Report   Final       COMMENT                   NO GROWTH AEROBICALLY/ANAEROBICALLY IN 5 DAYS   ANTIBIOTIC                                                      Culture, blood (single)     Status: None   Collection Time: 11/10/14  2:15 PM  Result Value Ref Range Status   Micro Text Report   Final       COMMENT                   NO GROWTH AEROBICALLY/ANAEROBICALLY IN 5 DAYS   ANTIBIOTIC                                                      Culture, expectorated sputum-assessment     Status: None   Collection Time: 11/11/14  6:00 PM  Result Value Ref Range Status   Micro Text Report   Final       SOURCE: ENDOTRACHEAL SUCTION    COMMENT                   APPEARS TO BE NORMAL FLORA AT 48 HOURS   GRAM STAIN                MODERATE WHITE BLOOD CELLS   GRAM STAIN                NO ORGANISMS SEEN   GRAM STAIN                GOOD SPECIMEN-80-90% WBC    ANTIBIOTIC  Bronchial Wash Culture     Status: None   Collection Time: 11/14/14  4:30 AM  Result Value Ref Range Status   Micro Text Report   Final       COMMENT                   APPEARS TO BE NORMAL FLORA AT 48 HOURS   GRAM STAIN                FEW WHITE BLOOD CELLS   GRAM STAIN                MODERATE GRAM POSITIVE COCCI IN PAIRS   GRAM STAIN                RARE GRAM NEGATIVE ROD   ANTIBIOTIC                                                        Anti-infectives    Start     Dose/Rate Route Frequency Ordered Stop   11/21/14 1430  meropenem (MERREM) 1 g in sodium chloride 0.9 % 50 mL IVPB  Status:  Discontinued     1 g 100 mL/hr over 30 Minutes Intravenous 3 times per day 11/21/14 1424 11/21/14 1429   11/21/14 1430  meropenem (MERREM) 1 g in sodium chloride 0.9 % 100 mL IVPB     1 g 200 mL/hr over 30 Minutes Intravenous 3 times per day 11/21/14 1429     11/21/14 1315  meropenem (MERREM) 500 mg in sodium chloride 0.9 % 50 mL IVPB  Status:  Discontinued     500 mg 100 mL/hr over 30 Minutes Intravenous Every 12 hours 11/21/14 1302 11/21/14 1424   11/20/14 0200  meropenem (MERREM) 1 g in sodium chloride 0.9 % 50 mL IVPB     1 g 100 mL/hr over 30 Minutes Intravenous 3 times per day 11/19/14 1125 11/20/14 1759      Assessment: No growth in Bronchial wash in 48 hours.  WBC of 18 on 4/28.  Goal of Therapy:  Resolution of infection.    Plan:  Continue current orders for meropenem 1000mg  IV Q8hr. Will obtain follow-up wbc. Patient with multiple drug allergies making narrowing and transition to oral antibiotics difficult. Recommend considering discontinuing meropenem after completion of all doses on 5/6 as patient will have received > 12 days of therapy. Cultures remain negative.   Mora Pedraza K 11/25/2014,11:00 AM

## 2014-11-25 NOTE — Consult Note (Signed)
  Psychiatry: Follow-up for this patient with anxiety. No new complaints today. Mood reported as good. Patient feels more optimistic.  On review of systems she is back to having some shortness of breath. Still having chronic pain. Weakness. No depression. No suicidal ideation.  On mental status exam she is awake alert and oriented. Makes good eye contact. Psychomotor activity are little slowed. Speech normal. Smiling. Denies suicidal or homicidal ideation or hopelessness.  Patient appears to be stable. She feels better being told she probably doesn't have cancer. She is looking forward to discharge to [facility]. No change to current medication. Supportive counseling done. Follow up if she is still here on Monday.

## 2014-11-26 LAB — GLUCOSE, CAPILLARY
GLUCOSE-CAPILLARY: 105 mg/dL — AB (ref 70–99)
GLUCOSE-CAPILLARY: 76 mg/dL (ref 70–99)
GLUCOSE-CAPILLARY: 97 mg/dL (ref 70–99)
Glucose-Capillary: 130 mg/dL — ABNORMAL HIGH (ref 70–99)
Glucose-Capillary: 110 mg/dL — ABNORMAL HIGH (ref 70–99)

## 2014-11-26 MED ORDER — LEVOFLOXACIN 250 MG PO TABS
250.0000 mg | ORAL_TABLET | Freq: Every day | ORAL | Status: DC
  Administered 2014-11-26 – 2014-11-27 (×2): 250 mg via ORAL
  Filled 2014-11-26 (×2): qty 1

## 2014-11-26 NOTE — Progress Notes (Signed)
Patient Demographics  Tiffany Mclean, is a 64 y.o. female, DOB - 03-07-1951, IOX:[id number]  Admit date - 11/10/2014   Admitting Physician Theodoro Grist, MD  Outpatient Primary MD for the patient is Dr Caryn Section  LOS - 16  HPI:  Admitted 4/21 with fever, hypoxia, health care associated pneumonia. Has had prior admission for HCAP in 09/2014 and 10/2014.      SUBJECTIVE: Doing well this morning. On 2L [place], breathing comfortably.  Frustrated with deconditioning. Having stomach upset with meals. No further diarrhea.    Procedures bronchoscopy    Consults  Pulmonary, Dr Mortimer Fries   Medications  Scheduled Meds: . ALPRAZolam  0.25 mg Oral 3 times per day  . atorvastatin  40 mg Oral Daily  . budesonide-formoterol  2 puff Inhalation BID  . DULoxetine  90 mg Oral Q24H  . famotidine  20 mg Oral Q24H  . feeding supplement (ENSURE ENLIVE)  237 mL Oral BID AC  . fluticasone  2 spray Each Nare Daily  . guaiFENesin  600 mg Oral BID  . heparin subcutaneous  5,000 Units Subcutaneous 3 times per day  . insulin aspart  0-12 Units Subcutaneous 6 times per day  . ipratropium-albuterol  3 mL Nebulization QID  . meropenem (MERREM) IV  1 g Intravenous 3 times per day  . potassium chloride  20 mEq Oral Daily  . RABEprazole  20 mg Oral QAC breakfast  . sodium chloride  10-40 mL Intracatheter Q12H   Continuous Infusions:  PRN Meds:.ipratropium-albuterol, LORazepam, morphine, ondansetron (ZOFRAN) IV, sennosides, sodium chloride  DVT Prophylaxis SCD  Lab Results  Component Value Date   PLT 467* 11/23/2014    Antibiotics   Anti-infectives    Start     Dose/Rate Route Frequency Ordered Stop   11/21/14 1430  meropenem (MERREM) 1 g in sodium chloride 0.9 % 50 mL IVPB  Status:  Discontinued     1 g 100 mL/hr over 30 Minutes  Intravenous 3 times per day 11/21/14 1424 11/21/14 1429   11/21/14 1430  meropenem (MERREM) 1 g in sodium chloride 0.9 % 100 mL IVPB     1 g 200 mL/hr over 30 Minutes Intravenous 3 times per day 11/21/14 1429     11/21/14 1315  meropenem (MERREM) 500 mg in sodium chloride 0.9 % 50 mL IVPB  Status:  Discontinued     500 mg 100 mL/hr over 30 Minutes Intravenous Every 12 hours 11/21/14 1302 11/21/14 1424   11/20/14 0200  meropenem (MERREM) 1 g in sodium chloride 0.9 % 50 mL IVPB     1 g 100 mL/hr over 30 Minutes Intravenous 3 times per day 11/19/14 1125 11/20/14 1759          Objective:   Filed Vitals:   11/26/14 0400 11/26/14 0451 11/26/14 0725 11/26/14 0812  BP:  106/62 91/55   Pulse:  79 83   Temp:  97.8 F (36.6 C) 97.9 F (36.6 C)   TempSrc:  Oral Oral   Resp:  18 20   Height:      Weight: 46.8 kg (103 lb 2.8 oz)     SpO2:  95% 100% 99%    Wt Readings from Last 3 Encounters:  11/26/14 46.8 kg (103 lb  2.8 oz)     Intake/Output Summary (Last 24 hours) at 11/26/14 0926 Last data filed at 11/26/14 0919  Gross per 24 hour  Intake    610 ml  Output      0 ml  Net    610 ml     Physical Exam  Awake Alert, Oriented X 3, NAD PERRAL, EOMI Supple Neck,No JVD, No cervical lymphadenopathy appriciated.  Symmetrical Chest wall movement, Good air movement bilaterally, bibaslilar crackles RRR,No Gallops,Rubs or new Murmurs +ve B.Sounds, Abd Soft, No tenderness, No organomegaly appriciated, No rebound - guarding or rigidity. No Cyanosis, Clubbing or edema, No new Rash or bruise   Data Review   Micro Results No results found for this or any previous visit (from the past 240 hour(s)).  Radiology Reports Dg Chest 2 View  11/21/2014   CLINICAL DATA:  Shortness of breath.  EXAM: CHEST  2 VIEW  COMPARISON:  None.  FINDINGS: Interim extubation and removal of NG tube. Right IJ line in stable position. Heart size stable. Partial clearing of right mid lung atelectatic changes  with moderate residual. Continued follow-up chest x-rays recommended to demonstrate clearing. Pleural thickening noted most consistent scarring. Heart size stable. Prior cervical spine fusion .  IMPRESSION: 1. Interim extubation and removal of NG tube. Right IJ line in stable position. 2. Partial clearing of right base atelectatic changes with prominent residual. Continued follow-up chest x-rays recommended to demonstrate complete clearing.   Electronically Signed   By: [name]  Register   On: 11/21/2014 15:43   Ct Chest W Contrast  11/11/2014   CLINICAL DATA:  Evaluate for possible lung mass. History of pneumonia.  EXAM: CT CHEST WITH CONTRAST  TECHNIQUE: Multidetector CT imaging of the chest was performed during intravenous contrast administration.  CONTRAST:  75 cc Omnipaque 300  COMPARISON:  Chest CT 08/29/2014  FINDINGS: Mediastinum/Nodes: Endotracheal tube terminates in the mid trachea. Enteric tube courses inferior to the diaphragm, tip projects in the stomach. No enlarged axillary, mediastinal or hilar lymphadenopathy. Heart is normal in size. No pericardial effusion. There is rightward shift of the mediastinum.  Lungs/Pleura: Suggestion of an intraluminal lesion within the right middle lobe bronchi (image 36; series 3). When compared to prior examination (CT 08/29/2014) there has been near complete total atelectasis of the right middle lobe. Interval development of extensive predominantly peripheral irregular masslike pulmonary consolidation within the right lower lobe. No significant interval change in subpleural nodularity involving the right upper lobe. Small right pleural effusion.  Upper abdomen: No consolidative or nodular pulmonary opacities. Adrenal glands are unremarkable. No aggressive or acute appearing osseous lesions.  Musculoskeletal: No aggressive or acute appearing osseous lesions. Bilateral breast implants.  IMPRESSION: 1. Near complete atelectasis/consolidation of the right middle lobe.  There is a filling defect within the right middle lobe bronchus, likely secondary to mucous plugging. Mass is not entirely excluded. This may be partially responsible for the right middle lobe atelectasis. 2. Extensive predominantly subpleural masslike consolidation within the right lower lobe is nonspecific however may be infectious/inflammatory etiology as this was not present on prior CT 08/29/2014. Recommend short-term followup chest CT in 3-4 weeks after completion of appropriate antibiotic therapy to evaluate for interval improvement and exclude the possibility of underlying mass/neoplasm.   Electronically Signed   By: Lovey Newcomer M.D.   On: 11/11/2014 16:26   Nm Pet Image Initial (pi) Whole Body  11/24/2014   CLINICAL DATA:  Initial Treatment strategy for lung mass . History of squamous cell  carcinoma of the vagina.  EXAM: NUCLEAR MEDICINE PET WHOLE BODY  TECHNIQUE: 14.2 mCi F-18 FDG was injected intravenously. Full-ring PET imaging was performed from the vertex to the feet after the radiotracer. CT data was obtained and used for attenuation correction and anatomic localization.  FASTING BLOOD GLUCOSE:  Value:  107 mg/dl  COMPARISON:  11/11/2014  FINDINGS: Head/Neck: No hypermetabolic lymph nodes in the neck.  Chest: A band of density in the right lower lobe anteriorly has a maximum standard uptake value of 4.7. Atelectatic right lower lobe peripheral to this has a standard uptake value in the 2.8 range.  No enlarged pole pulmonary lymph nodes are identified. Faintly hypermetabolic but tiny right upper internal mammary node maximum standard uptake value 2.5. Small subcarinal lymph node maximum standard uptake value 3.2. Background mediastinal activity approximately 3.0.  Emphysema noted. Bilateral breast implants. Anterior mediastinal lymph node measuring 0.7 cm in short axis is not hypermetabolic.  Trace right pleural effusion.  Abdomen/Pelvis: Gastric fundal activity is probably physiologic. There is  physiologic activity in several loops of bowel.  Cholecystectomy. Aortoiliac atherosclerotic vascular disease. Uterus not well seen, presumed hysterectomy.  Skeleton: No focal hypermetabolic activity to suggest skeletal metastasis.  Extremities: No hypermetabolic activity to suggest metastasis.  IMPRESSION: 1. Bands of volume loss and atelectasis in the right middle lobe and right lower lobe. One of the anterior bands in the right lower lobe is hypermetabolic with maximum standard uptake value 4.7, which could be due to an underlying malignancy obscured by surrounding atelectatic lung, or granulomatous process in this region. However, we do not observe hypermetabolic activity in the vicinity of the occluded right middle lobe bronchus, which is indeed still occluded proximally. Bronchoscopy may be warranted if not already performed. 2. Emphysema. 3. There several faintly hypermetabolic lymph nodes including a small right upper internal mammary node and a small subcarinal node, but these are not really appreciably above background mediastinal activity and may simply be reactive. 4. Trace right pleural effusion. 5. There is diffuse activity in the nondistended gastric fundus, which is nonspecific and may be physiologic. 6.  Aortoiliac atherosclerotic vascular disease.   Electronically Signed   By: Van Clines M.D.   On: 11/24/2014 17:29   Dg Chest Port 1 View  11/14/2014   CLINICAL DATA:  Respiratory failure.  EXAM: PORTABLE CHEST - 1 VIEW  COMPARISON:  CT 11/11/2014.  Chest x-ray 11/11/2014.  FINDINGS: Endotracheal tube, NG tube, left IJ line in stable position. Heart size stable. Pulmonary vascularity is normal. Persistent dense atelectasis and/or infiltrate right lung noted. Persistent right pleural effusion noted. No pneumothorax.  IMPRESSION: 1. Lines and tubes in stable position. 2. Persistent dense right lower lobe atelectasis and/or infiltrate with persistent small right pleural effusion.    Electronically Signed   By: [name]  Register   On: 11/14/2014 07:43   Dg Chest Port 1 View  11/11/2014   CLINICAL DATA:  Central line placement.  EXAM: PORTABLE CHEST - 1 VIEW  COMPARISON:  11/10/2014  FINDINGS: Endotracheal tube tip projects 3.9 cm proximal to the carina. Left IJ catheter tip projects over the distal SVC. NG tube descends below the level of the image. Other wires and tubes are presumably external. Breast prostheses obscure detailed lung evaluation. Allowing for this, no overt pneumothorax or consolidation on the left. Persistent right middle lobe consolidation is similar. Cardiomediastinal contours are partially obscured. No interval osseous change.  IMPRESSION: Left IJ catheter tip projects over the distal SVC.  No pneumothorax.  Unchanged endotracheal and NG tubes.  Persistent right middle lobe consolidation.   Electronically Signed   By: Carlos Levering M.D.   On: 11/11/2014 01:38   Dg Chest Port 1 View  11/10/2014   CLINICAL DATA:  Post intubation  EXAM: PORTABLE CHEST - 1 VIEW  COMPARISON:  Prior film same day  FINDINGS: Cardiomediastinal silhouette is stable. Hyperinflation again noted. Persistent consolidation in right middle lobe. Endotracheal tube in place with tip 3.6 cm above the carina. No pneumothorax.  IMPRESSION: Hyperinflation again noted. Persistent consolidation in right middle lobe. Endotracheal tube in place with tip 3.6 cm above the carina. No pneumothorax.   Electronically Signed   By: Lahoma Crocker M.D.   On: 11/10/2014 16:11   Dg Chest Port 1 View  11/10/2014   CLINICAL DATA:  Pneumonia, shortness of breath beginning today  EXAM: PORTABLE CHEST - 1 VIEW  COMPARISON:  Portable exam 1422 hours compared to 10/23/2014  FINDINGS: Normal heart size, mediastinal contours and pulmonary vascularity.  Emphysematous changes compatible with COPD.  Increased consolidation RIGHT middle lobe.  Mild volume loss RIGHT chest with mediastinal shift to RIGHT.  Remaining lungs clear.  No  pleural effusion or pneumothorax.  Bones demineralized with evidence of prior cervical spine fusion.  BILATERAL breast prostheses.  IMPRESSION: COPD changes with increased consolidation in RIGHT middle lobe; radiographic followup until resolution recommended to exclude underlying abnormalities.   Electronically Signed   By: Lavonia Dana M.D.   On: 11/10/2014 15:04     CBC  Recent Labs Lab 11/23/14 0409  WBC 10.2  HGB 10.3*  HCT 32.9*  PLT 467*  MCV 87.4  MCH 27.3  MCHC 31.2*  RDW 18.8*    Chemistries   Recent Labs Lab 11/23/14 0409  NA 139  K 4.5  CL 101  CO2 33*  GLUCOSE 112*  BUN 20  CREATININE 0.74  CALCIUM 9.0   ------------------------------------------------------------------------------------------------------------------ estimated creatinine clearance is 51 mL/min (by C-G formula based on Cr of 0.74). ------------------------------------------------------------------------------------------------------------------ No results for input(s): HGBA1C in the last 72 hours. ------------------------------------------------------------------------------------------------------------------ No results for input(s): CHOL, HDL, LDLCALC, TRIG, CHOLHDL, LDLDIRECT in the last 72 hours. ------------------------------------------------------------------------------------------------------------------ No results for input(s): TSH, T4TOTAL, T3FREE, THYROIDAB in the last 72 hours.  Invalid input(s): FREET3 ------------------------------------------------------------------------------------------------------------------ No results for input(s): VITAMINB12, FOLATE, FERRITIN, TIBC, IRON, RETICCTPCT in the last 72 hours.  Coagulation profile No results for input(s): INR, PROTIME in the last 168 hours.  No results for input(s): DDIMER in the last 72 hours.  Cardiac Enzymes No results for input(s): CKMB, TROPONINI, MYOGLOBIN in the last 168 hours.  Invalid input(s):  CK ------------------------------------------------------------------------------------------------------------------ Invalid input(s): POCBNP    Assessment/plan 1.acute on chronic respiratory failure due to pneumonia - appreciate pulmonology following - has had 3 HCAP admissions in 3 months - brochoscopy unrevealing - PET scan with no overt malignancy, RLL pna, can not rule out malignancy will need to follow up as outpatient for repeat PET in 2 months - cultures negative during this admission - now back to baseline 2L - change from meropenim to levaquin today  2 COPD with chronic respiratory failure requiring 2L - Continue her on nebulizer, Symbicort.  3 . anxiety; controlled. - appreciate psychiatry consultation -  Continue her on Cymbalta. He is also on Xanax continue that.  3.deconditioning - minimal  ambulation for 28yrs - PT recommends SNF. [facility] Monday  4. FTT - low weight, continue nutritional suppliments - A1C 5.6, not diabetic, stop finger sticks and liberalize diet.  Time Spent in minutes   35 min   Myrtis Ser M.D on 11/26/2014 at 9:26 AM

## 2014-11-26 NOTE — Outcomes Assessment (Signed)
Patient is A+O with no signs of distress. BP tends to run in the high 34'M for systolic to low 219'I  Pain is controlled with current medications.  2L O2 spo2 in mid 90's. Appears to have slept well. Incontinent of urine, turned q2h.  IV antibiotics infusing.

## 2014-11-26 NOTE — Progress Notes (Signed)
Pt alert . Denies any pain. Left IJ REMOVED PER HOSPITAL PROTOCOL. TOLERATED WELL. DRESSING CLEAN DRY AND INTACT. Marland Kitchen APPETITE IMPROVED. TOLERATING ENSURE ENLIVE . NO URINARY INCONTINENCY TODAY.CONTINUES TO HAVE WET SOUNDING COUGH. BREATH SOUNDS DECREASED THROUGHOUT .

## 2014-11-26 NOTE — Plan of Care (Signed)
Problem: Phase I Progression Outcomes Goal: O2 sats > or equal 90% or at baseline Outcome: Progressing Oxygen at 2l per Miguel Barrera  Problem: Phase III Progression Outcomes Goal: O2 sats > or equal to 93% on room air Outcome: Not Met (add Reason) Unable to wean off the oxygen 11/25/14

## 2014-11-27 ENCOUNTER — Other Ambulatory Visit: Payer: Self-pay

## 2014-11-27 LAB — BASIC METABOLIC PANEL
Anion gap: 5 (ref 5–15)
Anion gap: 9 (ref 5–15)
BUN: 24 mg/dL — AB (ref 6–20)
BUN: 24 mg/dL — ABNORMAL HIGH (ref 6–20)
CALCIUM: 9.7 mg/dL (ref 8.9–10.3)
CALCIUM: 9.4 mg/dL (ref 8.9–10.3)
CHLORIDE: 100 mmol/L — AB (ref 101–111)
CO2: 36 mmol/L — ABNORMAL HIGH (ref 22–32)
CO2: 35 mmol/L — AB (ref 22–32)
Chloride: 98 mmol/L — ABNORMAL LOW (ref 101–111)
Creatinine, Ser: 0.59 mg/dL (ref 0.44–1.00)
Creatinine, Ser: 0.58 mg/dL (ref 0.44–1.00)
GFR calc Af Amer: >60 mL/min (ref >60)
GFR calc Af Amer: >60 mL/min (ref >60)
GFR calc non Af Amer: >60 mL/min (ref >60)
GFR calc non Af Amer: >60 mL/min (ref >60)
GLUCOSE: 113 mg/dL — AB (ref 65–99)
Glucose, Bld: 130 mg/dL — ABNORMAL HIGH (ref 65–99)
POTASSIUM: 4.5 mmol/L (ref 3.5–5.1)
Potassium: 6.1 mmol/L — ABNORMAL HIGH (ref 3.5–5.1)
SODIUM: 142 mmol/L (ref 135–145)
Sodium: 141 mmol/L (ref 135–145)

## 2014-11-27 LAB — CBC
HEMATOCRIT: 33.3 % — AB (ref 35.0–47.0)
Hemoglobin: 10.3 g/dL — ABNORMAL LOW (ref 12.0–16.0)
MCH: 27.1 pg (ref 26.0–34.0)
MCHC: 30.8 g/dL — AB (ref 32.0–36.0)
MCV: 87.8 fL (ref 80.0–100.0)
PLATELETS: 354 10*3/uL (ref 150–440)
RBC: 3.79 MIL/uL — ABNORMAL LOW (ref 3.80–5.20)
RDW: 18.6 % — ABNORMAL HIGH (ref 11.5–14.5)
WBC: 6.9 10*3/uL (ref 3.6–11.0)

## 2014-11-27 MED ORDER — SODIUM POLYSTYRENE SULFONATE 15 GM/60ML PO SUSP
30.0000 g | Freq: Once | ORAL | Status: AC
  Administered 2014-11-27: 30 g via ORAL
  Filled 2014-11-27: qty 120

## 2014-11-27 MED ORDER — ALPRAZOLAM 0.25 MG PO TABS
0.2500 mg | ORAL_TABLET | Freq: Two times a day (BID) | ORAL | Status: DC | PRN
  Administered 2014-11-28: 0.25 mg via ORAL
  Filled 2014-11-27: qty 1

## 2014-11-27 NOTE — Progress Notes (Signed)
Patient Demographics  Tiffany Mclean, is a 64 y.o. female, DOB - Nov 30, 1950, MBT:[id number]  Admit date - 11/10/2014   Admitting Physician Theodoro Grist, MD  Outpatient Primary MD for the patient is Dr Caryn Section  LOS - 17  HPI:  Admitted 4/21 with fever, hypoxia, health care associated pneumonia. Has had prior admission for HCAP in 09/2014 and 10/2014.      SUBJECTIVE: Doing well this morning. On 2L [place], breathing comfortably. No new complaints.    Procedures bronchoscopy    Consults  Pulmonary, Dr Mortimer Fries   Medications  Scheduled Meds: . atorvastatin  40 mg Oral Daily  . budesonide-formoterol  2 puff Inhalation BID  . DULoxetine  90 mg Oral Q24H  . famotidine  20 mg Oral Q24H  . feeding supplement (ENSURE ENLIVE)  237 mL Oral BID AC  . fluticasone  2 spray Each Nare Daily  . guaiFENesin  600 mg Oral BID  . heparin subcutaneous  5,000 Units Subcutaneous 3 times per day  . ipratropium-albuterol  3 mL Nebulization QID  . levofloxacin  250 mg Oral Daily  . RABEprazole  20 mg Oral QAC breakfast  . sodium chloride  10-40 mL Intracatheter Q12H   Continuous Infusions:  PRN Meds:.ALPRAZolam, ipratropium-albuterol, morphine, ondansetron (ZOFRAN) IV, sennosides, sodium chloride  DVT Prophylaxis SCD  Lab Results  Component Value Date   PLT 354 11/27/2014    Antibiotics   Anti-infectives    Start     Dose/Rate Route Frequency Ordered Stop   11/26/14 1100  levofloxacin (LEVAQUIN) tablet 250 mg     250 mg Oral Daily 11/26/14 0945     11/21/14 1430  meropenem (MERREM) 1 g in sodium chloride 0.9 % 50 mL IVPB  Status:  Discontinued     1 g 100 mL/hr over 30 Minutes Intravenous 3 times per day 11/21/14 1424 11/21/14 1429   11/21/14 1430  meropenem (MERREM) 1 g in sodium chloride 0.9 % 100 mL IVPB  Status:   Discontinued     1 g 200 mL/hr over 30 Minutes Intravenous 3 times per day 11/21/14 1429 11/26/14 0943   11/21/14 1315  meropenem (MERREM) 500 mg in sodium chloride 0.9 % 50 mL IVPB  Status:  Discontinued     500 mg 100 mL/hr over 30 Minutes Intravenous Every 12 hours 11/21/14 1302 11/21/14 1424   11/20/14 0200  meropenem (MERREM) 1 g in sodium chloride 0.9 % 50 mL IVPB     1 g 100 mL/hr over 30 Minutes Intravenous 3 times per day 11/19/14 1125 11/20/14 1759          Objective:   Filed Vitals:   11/27/14 0500 11/27/14 0724 11/27/14 0757 11/27/14 1123  BP:   100/59   Pulse:   93   Temp:   98.3 F (36.8 C)   TempSrc:   Oral   Resp:   18   Height:      Weight: 50.259 kg (110 lb 12.8 oz)     SpO2:  99% 97% 98%    Wt Readings from Last 3 Encounters:  11/27/14 50.259 kg (110 lb 12.8 oz)     Intake/Output Summary (Last 24 hours) at 11/27/14 1241 Last data filed at 11/27/14 1009  Gross per  24 hour  Intake     10 ml  Output    850 ml  Net   -840 ml     Physical Exam  Awake Alert, Oriented X 3, NAD PERRAL, EOMI Supple Neck,No JVD, No cervical lymphadenopathy appriciated.  Symmetrical Chest wall movement, Good air movement bilaterally, bibaslilar crackles RRR,No Gallops,Rubs or new Murmurs +ve B.Sounds, Abd Soft, No tenderness, No organomegaly appriciated, No rebound - guarding or rigidity. No Cyanosis, Clubbing or edema, No new Rash or bruise Generally very weak   Data Review   Micro Results No results found for this or any previous visit (from the past 240 hour(s)).  Radiology Reports Dg Chest 2 View  11/21/2014   CLINICAL DATA:  Shortness of breath.  EXAM: CHEST  2 VIEW  COMPARISON:  None.  FINDINGS: Interim extubation and removal of NG tube. Right IJ line in stable position. Heart size stable. Partial clearing of right mid lung atelectatic changes with moderate residual. Continued follow-up chest x-rays recommended to demonstrate clearing. Pleural thickening  noted most consistent scarring. Heart size stable. Prior cervical spine fusion .  IMPRESSION: 1. Interim extubation and removal of NG tube. Right IJ line in stable position. 2. Partial clearing of right base atelectatic changes with prominent residual. Continued follow-up chest x-rays recommended to demonstrate complete clearing.   Electronically Signed   By: [name]  Register   On: 11/21/2014 15:43   Ct Chest W Contrast  11/11/2014   CLINICAL DATA:  Evaluate for possible lung mass. History of pneumonia.  EXAM: CT CHEST WITH CONTRAST  TECHNIQUE: Multidetector CT imaging of the chest was performed during intravenous contrast administration.  CONTRAST:  75 cc Omnipaque 300  COMPARISON:  Chest CT 08/29/2014  FINDINGS: Mediastinum/Nodes: Endotracheal tube terminates in the mid trachea. Enteric tube courses inferior to the diaphragm, tip projects in the stomach. No enlarged axillary, mediastinal or hilar lymphadenopathy. Heart is normal in size. No pericardial effusion. There is rightward shift of the mediastinum.  Lungs/Pleura: Suggestion of an intraluminal lesion within the right middle lobe bronchi (image 36; series 3). When compared to prior examination (CT 08/29/2014) there has been near complete total atelectasis of the right middle lobe. Interval development of extensive predominantly peripheral irregular masslike pulmonary consolidation within the right lower lobe. No significant interval change in subpleural nodularity involving the right upper lobe. Small right pleural effusion.  Upper abdomen: No consolidative or nodular pulmonary opacities. Adrenal glands are unremarkable. No aggressive or acute appearing osseous lesions.  Musculoskeletal: No aggressive or acute appearing osseous lesions. Bilateral breast implants.  IMPRESSION: 1. Near complete atelectasis/consolidation of the right middle lobe. There is a filling defect within the right middle lobe bronchus, likely secondary to mucous plugging. Mass is not  entirely excluded. This may be partially responsible for the right middle lobe atelectasis. 2. Extensive predominantly subpleural masslike consolidation within the right lower lobe is nonspecific however may be infectious/inflammatory etiology as this was not present on prior CT 08/29/2014. Recommend short-term followup chest CT in 3-4 weeks after completion of appropriate antibiotic therapy to evaluate for interval improvement and exclude the possibility of underlying mass/neoplasm.   Electronically Signed   By: Lovey Newcomer M.D.   On: 11/11/2014 16:26   Nm Pet Image Initial (pi) Whole Body  11/24/2014   CLINICAL DATA:  Initial Treatment strategy for lung mass . History of squamous cell carcinoma of the vagina.  EXAM: NUCLEAR MEDICINE PET WHOLE BODY  TECHNIQUE: 14.2 mCi F-18 FDG was injected intravenously. Full-ring PET  imaging was performed from the vertex to the feet after the radiotracer. CT data was obtained and used for attenuation correction and anatomic localization.  FASTING BLOOD GLUCOSE:  Value:  107 mg/dl  COMPARISON:  11/11/2014  FINDINGS: Head/Neck: No hypermetabolic lymph nodes in the neck.  Chest: A band of density in the right lower lobe anteriorly has a maximum standard uptake value of 4.7. Atelectatic right lower lobe peripheral to this has a standard uptake value in the 2.8 range.  No enlarged pole pulmonary lymph nodes are identified. Faintly hypermetabolic but tiny right upper internal mammary node maximum standard uptake value 2.5. Small subcarinal lymph node maximum standard uptake value 3.2. Background mediastinal activity approximately 3.0.  Emphysema noted. Bilateral breast implants. Anterior mediastinal lymph node measuring 0.7 cm in short axis is not hypermetabolic.  Trace right pleural effusion.  Abdomen/Pelvis: Gastric fundal activity is probably physiologic. There is physiologic activity in several loops of bowel.  Cholecystectomy. Aortoiliac atherosclerotic vascular disease. Uterus  not well seen, presumed hysterectomy.  Skeleton: No focal hypermetabolic activity to suggest skeletal metastasis.  Extremities: No hypermetabolic activity to suggest metastasis.  IMPRESSION: 1. Bands of volume loss and atelectasis in the right middle lobe and right lower lobe. One of the anterior bands in the right lower lobe is hypermetabolic with maximum standard uptake value 4.7, which could be due to an underlying malignancy obscured by surrounding atelectatic lung, or granulomatous process in this region. However, we do not observe hypermetabolic activity in the vicinity of the occluded right middle lobe bronchus, which is indeed still occluded proximally. Bronchoscopy may be warranted if not already performed. 2. Emphysema. 3. There several faintly hypermetabolic lymph nodes including a small right upper internal mammary node and a small subcarinal node, but these are not really appreciably above background mediastinal activity and may simply be reactive. 4. Trace right pleural effusion. 5. There is diffuse activity in the nondistended gastric fundus, which is nonspecific and may be physiologic. 6.  Aortoiliac atherosclerotic vascular disease.   Electronically Signed   By: Van Clines M.D.   On: 11/24/2014 17:29   Dg Chest Port 1 View  11/14/2014   CLINICAL DATA:  Respiratory failure.  EXAM: PORTABLE CHEST - 1 VIEW  COMPARISON:  CT 11/11/2014.  Chest x-ray 11/11/2014.  FINDINGS: Endotracheal tube, NG tube, left IJ line in stable position. Heart size stable. Pulmonary vascularity is normal. Persistent dense atelectasis and/or infiltrate right lung noted. Persistent right pleural effusion noted. No pneumothorax.  IMPRESSION: 1. Lines and tubes in stable position. 2. Persistent dense right lower lobe atelectasis and/or infiltrate with persistent small right pleural effusion.   Electronically Signed   By: [name]  Register   On: 11/14/2014 07:43   Dg Chest Port 1 View  11/11/2014   CLINICAL DATA:   Central line placement.  EXAM: PORTABLE CHEST - 1 VIEW  COMPARISON:  11/10/2014  FINDINGS: Endotracheal tube tip projects 3.9 cm proximal to the carina. Left IJ catheter tip projects over the distal SVC. NG tube descends below the level of the image. Other wires and tubes are presumably external. Breast prostheses obscure detailed lung evaluation. Allowing for this, no overt pneumothorax or consolidation on the left. Persistent right middle lobe consolidation is similar. Cardiomediastinal contours are partially obscured. No interval osseous change.  IMPRESSION: Left IJ catheter tip projects over the distal SVC.  No pneumothorax.  Unchanged endotracheal and NG tubes.  Persistent right middle lobe consolidation.   Electronically Signed   By: Delano Metz.D.  On: 11/11/2014 01:38   Dg Chest Port 1 View  11/10/2014   CLINICAL DATA:  Post intubation  EXAM: PORTABLE CHEST - 1 VIEW  COMPARISON:  Prior film same day  FINDINGS: Cardiomediastinal silhouette is stable. Hyperinflation again noted. Persistent consolidation in right middle lobe. Endotracheal tube in place with tip 3.6 cm above the carina. No pneumothorax.  IMPRESSION: Hyperinflation again noted. Persistent consolidation in right middle lobe. Endotracheal tube in place with tip 3.6 cm above the carina. No pneumothorax.   Electronically Signed   By: Lahoma Crocker M.D.   On: 11/10/2014 16:11   Dg Chest Port 1 View  11/10/2014   CLINICAL DATA:  Pneumonia, shortness of breath beginning today  EXAM: PORTABLE CHEST - 1 VIEW  COMPARISON:  Portable exam 1422 hours compared to 10/23/2014  FINDINGS: Normal heart size, mediastinal contours and pulmonary vascularity.  Emphysematous changes compatible with COPD.  Increased consolidation RIGHT middle lobe.  Mild volume loss RIGHT chest with mediastinal shift to RIGHT.  Remaining lungs clear.  No pleural effusion or pneumothorax.  Bones demineralized with evidence of prior cervical spine fusion.  BILATERAL breast  prostheses.  IMPRESSION: COPD changes with increased consolidation in RIGHT middle lobe; radiographic followup until resolution recommended to exclude underlying abnormalities.   Electronically Signed   By: Lavonia Dana M.D.   On: 11/10/2014 15:04     CBC  Recent Labs Lab 11/23/14 0409 11/27/14 0408  WBC 10.2 6.9  HGB 10.3* 10.3*  HCT 32.9* 33.3*  PLT 467* 354  MCV 87.4 87.8  MCH 27.3 27.1  MCHC 31.2* 30.8*  RDW 18.8* 18.6*    Chemistries   Recent Labs Lab 11/23/14 0409 11/27/14 0408  NA 139 141  K 4.5 6.1*  CL 101 100*  CO2 33* 36*  GLUCOSE 112* 113*  BUN 20 24*  CREATININE 0.74 0.59  CALCIUM 9.0 9.7   ------------------------------------------------------------------------------------------------------------------ estimated creatinine clearance is 51 mL/min (by C-G formula based on Cr of 0.59). ------------------------------------------------------------------------------------------------------------------ No results for input(s): HGBA1C in the last 72 hours. ------------------------------------------------------------------------------------------------------------------ No results for input(s): CHOL, HDL, LDLCALC, TRIG, CHOLHDL, LDLDIRECT in the last 72 hours. ------------------------------------------------------------------------------------------------------------------ No results for input(s): TSH, T4TOTAL, T3FREE, THYROIDAB in the last 72 hours.  Invalid input(s): FREET3 ------------------------------------------------------------------------------------------------------------------ No results for input(s): VITAMINB12, FOLATE, FERRITIN, TIBC, IRON, RETICCTPCT in the last 72 hours.  Coagulation profile No results for input(s): INR, PROTIME in the last 168 hours.  No results for input(s): DDIMER in the last 72 hours.  Cardiac Enzymes No results for input(s): CKMB, TROPONINI, MYOGLOBIN in the last 168 hours.  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  3 . anxiety; controlled. - appreciate psychiatry consultation -  Continue her on Cymbalta. He is also on Xanax continue that.  3.deconditioning - minimal  ambulation for 51yrs - PT recommends SNF. [facility] on Monday  4. FTT - low weight, continue nutritional suppliments - A1C 5.6, not diabetic, stop finger sticks and liberalize diet.  5. Hyperkalemia -  dc supplimental K - kayexelate given this am, recheck BMP at 2 pm - no ekg changes.   Time Spent in minutes   35 min   Myrtis Ser M.D on 11/27/2014 at 12:41 PM

## 2014-11-27 NOTE — Progress Notes (Signed)
MD called for K+ 6.1, was last checked on 5/4 and was 4.5. Pt on 20 meg qd. Asked MD to discontinue daily K+. MD said an order will also be entered for kaexylate.

## 2014-11-27 NOTE — Progress Notes (Signed)
oob to chair  For 3 .5 hours. Positive dyspnea on exertion. Breath sounds decreased throughout. np cough. Good appetite. [facility]charge [facility]. Serum potassium down after positive bm with kayexelate

## 2014-11-27 NOTE — Progress Notes (Signed)
Pt complained of pain twice this shift and medicated once this AM once blood pressure returned to a normal rang, was hypotensive at the beginning of the shift. Potassium elevated and was medicated with kaexylate. Pt reports a feeling of constipation so welcomes a chance to have a BM. Remains on 2L oxygen, but sats good. Only DOE when turned and repositioned in bed since pt is not ambulatory. Husband is at bedside and aware of plan of care.

## 2014-11-27 NOTE — Progress Notes (Signed)
PET SCAN REVIEWED WITH PATIENT, RECOMMEND REPEATING PET SCAN FOR INTERVAL CHANGES IN 6-8 WEEKS.    Case discussed with Dr. Volanda Napoleon

## 2014-11-28 DIAGNOSIS — J9611 Chronic respiratory failure with hypoxia: Secondary | ICD-10-CM | POA: Diagnosis present

## 2014-11-28 DIAGNOSIS — K219 Gastro-esophageal reflux disease without esophagitis: Secondary | ICD-10-CM | POA: Diagnosis present

## 2014-11-28 DIAGNOSIS — D638 Anemia in other chronic diseases classified elsewhere: Secondary | ICD-10-CM | POA: Diagnosis present

## 2014-11-28 DIAGNOSIS — J439 Emphysema, unspecified: Secondary | ICD-10-CM | POA: Diagnosis present

## 2014-11-28 LAB — BASIC METABOLIC PANEL
ANION GAP: 7 (ref 5–15)
BUN: 20 mg/dL (ref 6–20)
CALCIUM: 9.5 mg/dL (ref 8.9–10.3)
CO2: 32 mmol/L (ref 22–32)
CREATININE: 0.61 mg/dL (ref 0.44–1.00)
Chloride: 100 mmol/L — ABNORMAL LOW (ref 101–111)
GFR calc Af Amer: >60 mL/min (ref >60)
GFR calc non Af Amer: >60 mL/min (ref >60)
Glucose, Bld: 106 mg/dL — ABNORMAL HIGH (ref 65–99)
POTASSIUM: 4.4 mmol/L (ref 3.5–5.1)
Sodium: 139 mmol/L (ref 135–145)

## 2014-11-28 LAB — CBC
HCT: 32.4 % — ABNORMAL LOW (ref 35.0–47.0)
Hemoglobin: 10.3 g/dL — ABNORMAL LOW (ref 12.0–16.0)
MCH: 27.6 pg (ref 26.0–34.0)
MCHC: 31.8 g/dL — AB (ref 32.0–36.0)
MCV: 86.8 fL (ref 80.0–100.0)
PLATELETS: 334 10*3/uL (ref 150–440)
RBC: 3.73 MIL/uL — ABNORMAL LOW (ref 3.80–5.20)
RDW: 17.9 % — AB (ref 11.5–14.5)
WBC: 7.8 10*3/uL (ref 3.6–11.0)

## 2014-11-28 MED ORDER — FAMOTIDINE 20 MG PO TABS: 20.0000 mg | ORAL_TABLET | ORAL | Status: DC

## 2014-11-28 MED ORDER — LEVOFLOXACIN 500 MG PO TABS: 500.0000 mg | ORAL_TABLET | Freq: Every day | ORAL | Status: AC

## 2014-11-28 MED ORDER — MORPHINE SULFATE 15 MG PO TABS: 7.5000 mg | ORAL_TABLET | ORAL | Status: DC | PRN

## 2014-11-28 MED ORDER — ENSURE ENLIVE PO LIQD: 237.0000 mL | Freq: Two times a day (BID) | ORAL | Status: DC

## 2014-11-28 MED ORDER — LEVOFLOXACIN 250 MG PO TABS: 250.0000 mg | ORAL_TABLET | Freq: Every day | ORAL | Status: DC

## 2014-11-28 MED ORDER — ALPRAZOLAM 0.25 MG PO TABS: 0.2500 mg | ORAL_TABLET | Freq: Two times a day (BID) | ORAL | Status: DC | PRN

## 2014-11-28 MED ORDER — ONDANSETRON 4 MG PO TBDP: 4.0000 mg | ORAL_TABLET | Freq: Three times a day (TID) | ORAL | Status: DC | PRN

## 2014-11-28 MED ORDER — DULOXETINE HCL 30 MG PO CPEP: 90.0000 mg | ORAL_CAPSULE | ORAL | Status: DC

## 2014-11-28 MED ORDER — LEVOFLOXACIN 500 MG PO TABS: 500.0000 mg | ORAL_TABLET | Freq: Every day | ORAL | Status: DC

## 2014-11-28 MED ORDER — ONDANSETRON 4 MG PO TBDP
ORAL_TABLET | ORAL | Status: AC
  Filled 2014-11-28: qty 1

## 2014-11-28 MED ORDER — ONDANSETRON 4 MG PO TBDP
4.0000 mg | ORAL_TABLET | Freq: Three times a day (TID) | ORAL | Status: DC | PRN
Start: 2014-11-28 — End: 2014-11-28

## 2014-11-28 NOTE — Care Management Note (Signed)
Case Management Note  Patient Details  Name: Tiffany Mclean MRN: [id number] Date of Birth: [birth year]  Patient discharging to [facility] today. I have notified [facility] home health of this plan today. Orders faxed to [facility] home health [phone]. No further RNCM needs.

## 2014-11-28 NOTE — Discharge Instructions (Signed)
°  DIET:  Regular diet with dietary suppliment  DISCHARGE CONDITION:  Fair  ACTIVITY:  Activity as tolerated, per PT recomendations  OXYGEN:  Home Oxygen: Yes.     Oxygen Delivery: 2 liters/min via Patient connected to nasal cannula oxygen  DISCHARGE LOCATION:  nursing home   If you experience worsening of your admission symptoms, develop shortness of breath, life threatening emergency, suicidal or homicidal thoughts you must seek medical attention immediately by calling 911 or calling your MD immediately  if symptoms less severe.  You Must read complete instructions/literature along with all the possible adverse reactions/side effects for all the Medicines you take and that have been prescribed to you. Take any new Medicines after you have completely understood and accpet all the possible adverse reactions/side effects.   Please note  You were cared for by a hospitalist during your hospital stay. If you have any questions about your discharge medications or the care you received while you were in the hospital after you are discharged, you can call the unit and asked to speak with the hospitalist on call if the hospitalist that took care of you is not available. Once you are discharged, your primary care physician will handle any further medical issues. Please note that NO REFILLS for any discharge medications will be authorized once you are discharged, as it is imperative that you return to your primary care physician (or establish a relationship with a primary care physician if you do not have one) for your aftercare needs so that they can reassess your need for medications and monitor your lab values.

## 2014-11-28 NOTE — Discharge Summary (Addendum)
[facility] [place]   PATIENT NAME: Tiffany Mclean    MR#:  [id number]  DATE OF BIRTH:  February 20, 1951  DATE OF ADMISSION:  11/10/2014 ADMITTING PHYSICIAN: Theodoro Grist, MD  DATE OF DISCHARGE: 11/28/2014  PRIMARY CARE PHYSICIAN: Lelon Huh   ADMISSION DIAGNOSIS:  Acute respiratory failure  DISCHARGE DIAGNOSIS:  Active Problems:   Pneumonia   Generalized anxiety disorder   COPD with emphysema   Chronic respiratory failure with hypoxia   GERD (gastroesophageal reflux disease)   Anemia of chronic disease   SECONDARY DIAGNOSIS:   Past Medical History  Diagnosis Date  . Diabetes mellitus without complication   . COPD (chronic obstructive pulmonary disease)   . Pneumonia   . Cancer     squamous cell, vaginal    HOSPITAL COURSE:   1.acute on chronic respiratory failure due to pneumonia - appreciate pulmonology following - has had 3 HCAP admissions in 3 months - brochoscopy shows a benign mass possibly contributing to obstruction and recurrent pneumonias - PET scan with no overt malignancy, RLL pna, can not rule out malignancy will need to follow up as outpatient for repeat PET in 2 months - blood and sputum cultures negative during this admission - now back to baseline 2L -  Continue levaquin for one more week stop date 04/07/2015. Prolonged treatment course for recurrence.  2 COPD with chronic respiratory failure requiring 2L: stable - Continue on nebulizer, Symbicort.  3 . anxiety; controlled. - appreciate psychiatry consultation - Continue her on  High dose Cymbalta. Continue PRN Xanax.  3.deconditioning - minimal ambulation for 18yrs - PT recommends SNF. DC to [facility].  4. FTT - low weight, continue nutritional suppliments - A1C 5.6, not diabetic, stop finger sticks and liberalize diet.  5. Hyperkalemia during hospitalization - corrected.  Continue to monitor.   DISCHARGE  CONDITIONS:   Fair  CONSULTS OBTAINED:  Treatment Team:  Flora Lipps, MD Allyne Gee, MD Rainey Pines, MD Gonzella Lex, MD  DRUG ALLERGIES:   Allergies  Allergen Reactions  . Asa [Aspirin]   . Avelox [Moxifloxacin]   . Cephalexin   . Codeine   . Erythromycin Ethylsuccinate   . Flagyl [Metronidazole]   . Latex   . Levaquin [Levofloxacin] Hives  . Meloxicam   . Shellfish Allergy Nausea And Vomiting and Other (See Comments)    Dizzy, fainting  . Sulfa Antibiotics   . Topiramate   . Tetracyclines & Related Rash    DISCHARGE MEDICATIONS:   Current Discharge Medication List    START taking these medications   Details  famotidine (PEPCID) 20 MG tablet Take 1 tablet (20 mg total) by mouth daily. Qty: 30 tablet, Refills: 1    feeding supplement, ENSURE ENLIVE, (ENSURE ENLIVE) LIQD Take 237 mLs by mouth 2 (two) times daily before a meal. Qty: 237 mL, Refills: 12    levofloxacin (LEVAQUIN) 500 MG tablet Take 1 tablet (500 mg total) by mouth daily. Qty: 7 tablet, Refills: 0    ondansetron (ZOFRAN-ODT) 4 MG disintegrating tablet Take 1 tablet (4 mg total) by mouth every 8 (eight) hours as needed for nausea or vomiting. Qty: 20 tablet, Refills: 0      CONTINUE these medications which have CHANGED   Details  ALPRAZolam (XANAX) 0.25 MG tablet Take 1 tablet (0.25 mg total) by mouth 2 (two) times daily as needed for anxiety or sleep. Qty: 30 tablet, Refills: 0    DULoxetine (CYMBALTA) 30  MG capsule Take 3 capsules (90 mg total) by mouth daily. Qty: 1 capsule, Refills: 3    morphine (MSIR) 15 MG tablet Take 0.5 tablets (7.5 mg total) by mouth every 4 (four) hours as needed for moderate pain or severe pain ((4-6/10)). Qty: 30 tablet, Refills: 0      CONTINUE these medications which have NOT CHANGED   Details  acetaminophen (TYLENOL) 650 MG CR tablet Take 650 mg by mouth every 8 (eight) hours as needed for pain.    albuterol (ACCUNEB) 1.25 MG/3ML nebulizer solution  Take 1 ampule by nebulization 4 (four) times daily as needed for shortness of breath.    atorvastatin (LIPITOR) 40 MG tablet Take 40 mg by mouth daily.    budesonide-formoterol (SYMBICORT) 160-4.5 MCG/ACT inhaler Inhale 2 puffs into the lungs 2 (two) times daily.    cetirizine-pseudoephedrine (ZYRTEC-D) 5-120 MG per tablet Take 1 tablet by mouth daily as needed for allergies.    diphenhydrAMINE (BENADRYL) 25 MG tablet Take 25 mg by mouth 3 (three) times daily as needed for allergies.    fluticasone (FLONASE) 50 MCG/ACT nasal spray Place 1 spray into both nostrils daily.    guaiFENesin (MUCINEX) 600 MG 12 hr tablet Take 1,200 mg by mouth 2 (two) times daily as needed for to loosen phlegm.    ipratropium (ATROVENT) 0.02 % nebulizer solution Take 0.5 mg by nebulization every 6 (six) hours as needed for wheezing or shortness of breath.    magnesium oxide (MAG-OX) 400 MG tablet Take 400 mg by mouth daily.    miconazole (MICOTIN) 2 % powder Apply topically 2 (two) times daily as needed for itching.    RABEprazole (ACIPHEX) 20 MG tablet Take 20 mg by mouth daily.    senna (SENOKOT) 8.6 MG TABS tablet Take 1 tablet by mouth daily as needed for mild constipation.      STOP taking these medications     potassium chloride (KLOR-CON) 20 MEQ packet          DISCHARGE INSTRUCTIONS:    DIET:  Regular diet with dietary supplement as above  DISCHARGE CONDITION:  Fair  ACTIVITY:  Activity as tolerated, per PT recommendations.  OXYGEN:  Home Oxygen: Yes.     Oxygen Delivery: 2 liters/min via Patient connected to nasal cannula oxygen  DISCHARGE LOCATION:  nursing home [facility]  If you experience worsening of your admission symptoms, develop shortness of breath, life threatening emergency, suicidal or homicidal thoughts you must seek medical attention immediately by calling 911 or calling your MD immediately  if symptoms less severe.  You Must read complete  instructions/literature along with all the possible adverse reactions/side effects for all the Medicines you take and that have been prescribed to you. Take any new Medicines after you have completely understood and accpet all the possible adverse reactions/side effects.   Please note  You were cared for by a hospitalist during your hospital stay. If you have any questions about your discharge medications or the care you received while you were in the hospital after you are discharged, you can call the unit and asked to speak with the hospitalist on call if the hospitalist that took care of you is not available. Once you are discharged, your primary care physician will handle any further medical issues. Please note that NO REFILLS for any discharge medications will be authorized once you are discharged, as it is imperative that you return to your primary care physician (or establish a relationship with a primary care  physician if you do not have one) for your aftercare needs so that they can reassess your need for medications and monitor your lab values.    Today   CHIEF COMPLAINT:  Feeling weak this morning. No respiratory distress.   HISTORY OF PRESENT ILLNESS:  The patient is a 64 year old Caucasian female with past medical history significant for history of recent 2 admissions to the Emergency Room for acute respiratory failure, one.at the end of February and beginning of March 2016, for which she was treated for acute respiratory failure and healthcare-associated pneumonia, and the other admission at the beginning of April 2016; this admission was only to the Emergency Room; however, the patient was transferred to outside facility for severe sepsis. She comes back to the hospital to the Emergency Room today, on 11/10/2014, with severe shortness of breath tachypnea. According to triage note, the patient arrived via EMS with difficulty breathing, and she came in on CPAP. Her initial oxygen  saturations were 70s. She was given nebulizers and tried on BiPAP for 25 minutes; however, she was not moving air, despite oral therapy in the hospital, and she was intubated. Now, she is being sedated and not able to answer my questions. On arrival to the Emergency Room, she was febrile with temperature of 102. Her respiration rate was about 30s; and as mentioned above, her oxygen saturation was in the 70s. The patient did have a chest x-ray done in the Emergency Room, which revealed a right mid lung opacity. Hospital services were contacted for admission.    VITAL SIGNS:  Blood pressure 127/75, pulse 79, temperature 98.7 F (37.1 C), temperature source Oral, resp. rate 18, height 5' (1.524 m), weight 50.259 kg (110 lb 12.8 oz), SpO2 98 %.  I/O:    Intake/Output Summary (Last 24 hours) at 11/28/14 1421 Last data filed at 11/28/14 1300  Gross per 24 hour  Intake    240 ml  Output      0 ml  Net    240 ml    PHYSICAL EXAMINATION:  Physical Exam  Constitutional: She is oriented to person, place, and time.  Thin, pale, ill appearing   HENT:  Head: Normocephalic and atraumatic.  Eyes: Conjunctivae and EOM are normal. Pupils are equal, round, and reactive to light.  Neck: Normal range of motion. No JVD present. No thyromegaly present.  Cardiovascular: Normal rate, regular rhythm and normal heart sounds.   No murmur heard. Pulmonary/Chest: Effort normal. She has wheezes. She exhibits no tenderness.  Scattered wheezes  Abdominal: Soft. Bowel sounds are normal. She exhibits no distension and no mass. There is no tenderness. There is no rebound and no guarding.  Musculoskeletal: Normal range of motion. She exhibits no edema.  Lymphadenopathy:    She has no cervical adenopathy.  Neurological: She is oriented to person, place, and time. She exhibits abnormal muscle tone.  Generally weak  Skin: Skin is warm and dry.  Psychiatric:  anxious     DATA REVIEW:   CBC  Recent Labs Lab  11/28/14 0346  WBC 7.8  HGB 10.3*  HCT 32.4*  PLT 334    Chemistries   Recent Labs Lab 11/28/14 0346  NA 139  K 4.4  CL 100*  CO2 32  GLUCOSE 106*  BUN 20  CREATININE 0.61  CALCIUM 9.5    Cardiac Enzymes No results for input(s): TROPONINI in the last 168 hours.  Microbiology Results  Results for orders placed or performed during the hospital encounter of 11/10/14  Culture, blood (single)     Status: None   Collection Time: 11/10/14  2:15 PM  Result Value Ref Range Status   Micro Text Report   Final       COMMENT                   NO GROWTH AEROBICALLY/ANAEROBICALLY IN 5 DAYS   ANTIBIOTIC                                                      Culture, blood (single)     Status: None   Collection Time: 11/10/14  2:15 PM  Result Value Ref Range Status   Micro Text Report   Final       COMMENT                   NO GROWTH AEROBICALLY/ANAEROBICALLY IN 5 DAYS   ANTIBIOTIC                                                      Culture, expectorated sputum-assessment     Status: None   Collection Time: 11/11/14  6:00 PM  Result Value Ref Range Status   Micro Text Report   Final       SOURCE: ENDOTRACHEAL SUCTION    COMMENT                   APPEARS TO BE NORMAL FLORA AT 48 HOURS   GRAM STAIN                MODERATE WHITE BLOOD CELLS   GRAM STAIN                NO ORGANISMS SEEN   GRAM STAIN                GOOD SPECIMEN-80-90% WBC   ANTIBIOTIC                                                      Bronchial Wash Culture     Status: None   Collection Time: 11/14/14  4:30 AM  Result Value Ref Range Status   Micro Text Report   Final       COMMENT                   APPEARS TO BE NORMAL FLORA AT 48 HOURS   GRAM STAIN                FEW WHITE BLOOD CELLS   GRAM STAIN                MODERATE GRAM POSITIVE COCCI IN PAIRS   GRAM STAIN                RARE GRAM NEGATIVE ROD   ANTIBIOTIC  RADIOLOGY:  No results  found.  EKG:   Orders placed or performed during the hospital encounter of 11/10/14  . EKG 12-Lead  . EKG 12-Lead      Management plans discussed with the patient, family and they are in agreement.  CODE STATUS: FULL  TOTAL TIME TAKING CARE OF THIS PATIENT: 45 minutes.    Myrtis Ser M.D on 11/28/2014 at 2:21 PM  Between 7am to 6pm - Pager - [phone]  After 6pm go to [email or web address] - password [email or web address] [facility]  Office  [phone]  CC: Dr. Lelon Huh PCP

## 2014-11-28 NOTE — Progress Notes (Signed)
Patient is medically stable for D/C to [facility] today. Per Tiffany Mclean Liaison patient is going to room 145-A. RN will call report to Yaakov Guthrie at [phone]. Patient's husband Tiffany Mclean is providing transport in private vehicle. Husband will pick patient up at 2 pm today. Clinical Education officer, museum (CSW) prepared D/C packet and sent D/C Summary to [facility] via [facility]. CSW contacted patient's husband Tiffany Mclean at work and made him aware of above. Husband has agreed to transport patient. Patient recently received Medicare Part A. RN aware of above. Patient aware of above. Please reconsult if future social work needs arise. CSW signing off.   Tiffany Mclean, [place] [phone]

## 2014-11-28 NOTE — Clinical Social Work Placement (Signed)
   CLINICAL SOCIAL WORK PLACEMENT  NOTE  Date:  11/28/2014  Patient Details  Name: Tiffany Mclean MRN: [id number] Date of Birth: 08/16/1950  Clinical Social Work is seeking post-discharge placement for this patient at the [place] level of care (*CSW will initial, date and re-position this form in  chart as items are completed):  Yes   Patient/family provided with [facility] Work Department's list of facilities offering this level of care within the geographic area requested by the patient (or if unable, by the patient's family).  Yes   Patient/family informed of their freedom to choose among providers that offer the needed level of care, that participate in Medicare, Medicaid or managed care program needed by the patient, have an available bed and are willing to accept the patient.  Yes   Patient/family informed of [facility]'s ownership interest in [facility] and [facility], as well as of the fact that they are under no obligation to receive care at these facilities.  PASRR submitted to EDS on       PASRR number received on       Existing PASRR number confirmed on 11/22/14     FL2 transmitted to all facilities in geographic area requested by pt/family on 11/22/14     FL2 transmitted to all facilities within larger geographic area on       Patient informed that his/her managed care company has contracts with or will negotiate with certain facilities, including the following:            Patient/family informed of bed offers received.  Patient chooses bed at  ([facility]     Physician recommends and patient chooses bed at      Patient to be transferred to  ([facility] on 11/28/14.  Patient to be transferred to facility by  (Patient's husband Chaise Passarella will transport in private vehicle.  )     Patient family notified on 11/28/14 of transfer.  Name of family member notified:   (Husband Corky Mull)     PHYSICIAN Please sign FL2      Additional Comment:    _______________________________________________ Loralyn Freshwater, LCSW 11/28/2014, 11:18 AM

## 2014-12-06 ENCOUNTER — Other Ambulatory Visit
Admission: RE | Admit: 2014-12-06 | Discharge: 2014-12-06 | Disposition: A | Discharge status: Home / Self Care | Payer: BLUE CROSS/BLUE SHIELD | Source: Skilled Nursing Facility | Attending: Family Medicine | Admitting: Family Medicine

## 2014-12-06 DIAGNOSIS — R197 Diarrhea, unspecified: Secondary | ICD-10-CM | POA: Insufficient documentation

## 2014-12-06 LAB — CLOSTRIDIUM DIFFICILE BY PCR: CDIFFPCR: POSITIVE — AB

## 2014-12-07 LAB — C DIFFICILE QUICK SCREEN W PCR REFLEX
C Diff antigen: POSITIVE
C Diff toxin: NEGATIVE

## 2014-12-14 ENCOUNTER — Other Ambulatory Visit
Admission: RE | Admit: 2014-12-14 | Discharge: 2014-12-14 | Disposition: A | Discharge status: Home / Self Care | Payer: BLUE CROSS/BLUE SHIELD | Source: Skilled Nursing Facility | Attending: Family Medicine | Admitting: Family Medicine

## 2014-12-14 DIAGNOSIS — A047 Enterocolitis due to Clostridium difficile: Secondary | ICD-10-CM | POA: Insufficient documentation

## 2014-12-15 LAB — C DIFFICILE QUICK SCREEN W PCR REFLEX
C DIFFICLE (CDIFF) ANTIGEN: POSITIVE
C Diff interpretation: POSITIVE
C Diff toxin: POSITIVE

## 2014-12-27 ENCOUNTER — Institutional Professional Consult (permissible substitution): Payer: Self-pay | Admitting: Internal Medicine

## 2015-01-19 ENCOUNTER — Other Ambulatory Visit
Admission: RE | Admit: 2015-01-19 | Discharge: 2015-01-19 | Disposition: A | Discharge status: Home / Self Care | Payer: BLUE CROSS/BLUE SHIELD | Source: Skilled Nursing Facility | Attending: Family Medicine | Admitting: Family Medicine

## 2015-01-19 DIAGNOSIS — L02416 Cutaneous abscess of left lower limb: Secondary | ICD-10-CM | POA: Diagnosis not present

## 2015-01-22 LAB — WOUND CULTURE

## 2015-02-02 ENCOUNTER — Telehealth: Payer: Self-pay | Admitting: Family Medicine

## 2015-02-02 NOTE — Telephone Encounter (Signed)
Would like order for wound care.  Thanks, Con Memos

## 2015-02-05 NOTE — Telephone Encounter (Signed)
Need more information. We don't have any information in her chart documenting need for wound care.

## 2015-02-06 ENCOUNTER — Telehealth: Payer: Self-pay | Admitting: Family Medicine

## 2015-02-06 NOTE — Telephone Encounter (Signed)
That's fine

## 2015-02-06 NOTE — Telephone Encounter (Signed)
Left message for Tiffany Mclean from [facility] to return call concerning message requesting order for wound care.

## 2015-02-06 NOTE — Telephone Encounter (Addendum)
Millie with [facility] called to request a verbal order for community resources and home health in pt home.  [phone]

## 2015-02-07 ENCOUNTER — Telehealth: Payer: Self-pay | Admitting: Family Medicine

## 2015-02-07 NOTE — Telephone Encounter (Signed)
LM for Myriam Jacobson to return call.

## 2015-02-07 NOTE — Telephone Encounter (Signed)
Millie with [facility] was given verbal order.

## 2015-02-07 NOTE — Telephone Encounter (Signed)
Please call her back regarding pt's wound care.   Thanks Con Memos

## 2015-02-07 NOTE — Telephone Encounter (Signed)
Spoke with Millie from [facility] wound care order requested. Millie stated that she is going to have someone contact our office with more details.

## 2015-02-08 DIAGNOSIS — L89309 Pressure ulcer of unspecified buttock, unspecified stage: Secondary | ICD-10-CM | POA: Insufficient documentation

## 2015-02-13 ENCOUNTER — Ambulatory Visit (INDEPENDENT_AMBULATORY_CARE_PROVIDER_SITE_OTHER): Payer: BLUE CROSS/BLUE SHIELD | Admitting: Family Medicine

## 2015-02-13 VITALS — BP 122/60 | HR 82 | Temp 97.8°F | Resp 16 | Wt 111.0 lb

## 2015-02-13 DIAGNOSIS — F419 Anxiety disorder, unspecified: Secondary | ICD-10-CM | POA: Insufficient documentation

## 2015-02-13 DIAGNOSIS — E876 Hypokalemia: Secondary | ICD-10-CM | POA: Insufficient documentation

## 2015-02-13 DIAGNOSIS — J439 Emphysema, unspecified: Secondary | ICD-10-CM

## 2015-02-13 DIAGNOSIS — Z8540 Personal history of malignant neoplasm of unspecified female genital organ: Secondary | ICD-10-CM | POA: Diagnosis not present

## 2015-02-13 DIAGNOSIS — R942 Abnormal results of pulmonary function studies: Secondary | ICD-10-CM

## 2015-02-13 DIAGNOSIS — E612 Magnesium deficiency: Secondary | ICD-10-CM | POA: Insufficient documentation

## 2015-02-13 DIAGNOSIS — R29898 Other symptoms and signs involving the musculoskeletal system: Secondary | ICD-10-CM | POA: Insufficient documentation

## 2015-02-13 DIAGNOSIS — N951 Menopausal and female climacteric states: Secondary | ICD-10-CM | POA: Insufficient documentation

## 2015-02-13 DIAGNOSIS — N390 Urinary tract infection, site not specified: Secondary | ICD-10-CM

## 2015-02-13 DIAGNOSIS — F410 Panic disorder [episodic paroxysmal anxiety] without agoraphobia: Secondary | ICD-10-CM | POA: Insufficient documentation

## 2015-02-13 DIAGNOSIS — J189 Pneumonia, unspecified organism: Secondary | ICD-10-CM | POA: Diagnosis not present

## 2015-02-13 DIAGNOSIS — L309 Dermatitis, unspecified: Secondary | ICD-10-CM | POA: Insufficient documentation

## 2015-02-13 DIAGNOSIS — F332 Major depressive disorder, recurrent severe without psychotic features: Secondary | ICD-10-CM | POA: Diagnosis not present

## 2015-02-13 DIAGNOSIS — D72829 Elevated white blood cell count, unspecified: Secondary | ICD-10-CM | POA: Insufficient documentation

## 2015-02-13 DIAGNOSIS — R042 Hemoptysis: Secondary | ICD-10-CM

## 2015-02-13 MED ORDER — DULOXETINE HCL 30 MG PO CPEP
90.0000 mg | ORAL_CAPSULE | ORAL | Status: DC
Start: 2015-02-13 — End: 2015-05-23

## 2015-02-13 MED ORDER — MORPHINE SULFATE 15 MG PO TABS: 7.5000 mg | ORAL_TABLET | ORAL | Status: DC | PRN

## 2015-02-13 MED ORDER — TIOTROPIUM BROMIDE MONOHYDRATE 18 MCG IN CAPS: 18.0000 ug | ORAL_CAPSULE | Freq: Every day | RESPIRATORY_TRACT | Status: DC

## 2015-02-13 MED ORDER — NITROFURANTOIN MONOHYD MACRO 100 MG PO CAPS: 100.0000 mg | ORAL_CAPSULE | Freq: Every day | ORAL | Status: DC

## 2015-02-13 NOTE — Progress Notes (Signed)
Patient ID: Tiffany Mclean, female   DOB: [birth year], 64 y.o.   MRN: [id number]       Patient: Tiffany Mclean Female    DOB: [birth year]   64 y.o.   MRN: [id number] Visit Date: 02/13/2015  Today's Provider: Lelon Huh, MD   Chief Complaint  Patient presents with  . Hospitalization Follow-up   Subjective:    HPI Pt here for follow up of multiple hospitalizations overlast 5 months. She was in ICU at [facility] and [facility] for Pneumonia, UTI, MRSA, septic shock and C. Diff. Colitis. She was then sent to [facility] for rehab and was discharged home about 10 days ago. Pt reports that she is feeling better and is now able to ambulate She reports that her mind is still a " foggy" from all that she has been through and she has some dizziness. She also reports that she has a pressure that was been treated when she was in rehab.  She had a panic attack this morning and does not know what happened to her while she was in the hospital.  Home health has been initiated with [facility] and has started physical therapy     Allergies  Allergen Reactions  . Asa [Aspirin]   . Avelox [Moxifloxacin]   . Cephalexin   . Codeine   . Erythromycin Ethylsuccinate   . Flagyl [Metronidazole]   . Latex   . Levaquin [Levofloxacin] Hives  . Meloxicam   . Shellfish Allergy Nausea And Vomiting and Other (See Comments)    Dizzy, fainting  . Sulfa Antibiotics   . Topiramate   . Doxycycline Rash  . Tetracyclines & Related Rash   Previous Medications   ACETAMINOPHEN (RA ACETAMINOPHEN) 650 MG CR TABLET    Take 650 mg by mouth.   ALBUTEROL (ACCUNEB) 1.25 MG/3ML NEBULIZER SOLUTION    Take 1 ampule by nebulization 4 (four) times daily as needed for shortness of breath.   ALBUTEROL (PROAIR HFA) 108 (90 BASE) MCG/ACT INHALER    Inhale 2 puffs into the lungs every 6 (six) hours as needed.   ALPRAZOLAM (XANAX) 0.25 MG TABLET    Take 1 tablet (0.25 mg total) by mouth 2 (two) times daily as needed for anxiety or sleep.   ATORVASTATIN (LIPITOR) 40 MG TABLET    Take 40 mg by mouth daily.   BUDESONIDE-FORMOTEROL (SYMBICORT) 160-4.5 MCG/ACT INHALER    Inhale 2 puffs into the lungs 2 (two) times daily.   CETIRIZINE-PSEUDOEPHEDRINE (ZYRTEC-D) 5-120 MG PER TABLET    Take 1 tablet by mouth daily as needed for allergies.   FEEDING SUPPLEMENT, ENSURE ENLIVE, (ENSURE ENLIVE) LIQD    Take 237 mLs by mouth 2 (two) times daily before a meal.   FLUTICASONE (FLONASE) 50 MCG/ACT NASAL SPRAY    Place 1 spray into both nostrils daily.   FOLIC [id number] (FOLIC ACID XTRA) TABS    Take 1 tablet by mouth daily.   GUAIFENESIN (MUCINEX) 600 MG 12 HR TABLET    Take 1,200 mg by mouth 2 (two) times daily as needed for to loosen phlegm.   IPRATROPIUM (ATROVENT) 0.02 % NEBULIZER SOLUTION    Take 0.5 mg by nebulization every 6 (six) hours as needed for wheezing or shortness of breath.   IRON, IRON,    Take 1 tablet by mouth daily.   MAGNESIUM OXIDE (MAG-OX) 400 MG TABLET    Take 400 mg by mouth daily.   MECLIZINE HCL 25 MG CHEW    Chew 0.5 tablets by mouth  as needed.   MICONAZOLE (MICOTIN) 2 % POWDER    Apply topically 2 (two) times daily as needed for itching.   MORPHINE (MSIR) 15 MG TABLET    Take 0.5 tablets (7.5 mg total) by mouth every 4 (four) hours as needed for moderate pain or severe pain ((4-6/10)).   ONDANSETRON (ZOFRAN-ODT) 4 MG DISINTEGRATING TABLET    Take 1 tablet (4 mg total) by mouth every 8 (eight) hours as needed for nausea or vomiting.   OXYGEN    Inhale 2 L into the lungs. Per minute   POLYVINYL ALCOHOL-POVIDONE (CLEAR EYES ALL SEASONS OP)    Apply to eye as needed.   RABEPRAZOLE (ACIPHEX) 20 MG TABLET    Take 20 mg by mouth daily.   SACCHAROMYCES BOULARDII (FLORASTOR) 250 MG CAPSULE    Take 250 mg by mouth 2 (two) times daily.   SENNA (SENOKOT) 8.6 MG TABS TABLET    Take 1 tablet by mouth daily as needed for mild constipation.   TIOTROPIUM (SPIRIVA HANDIHALER) 18 MCG INHALATION CAPSULE    Place 1 puff into  inhaler and inhale daily.   VENLAFAXINE (EFFEXOR) 75 MG TABLET    Take 75 mg by mouth 2 (two) times daily.    Review of Systems  Constitutional: Positive for fatigue.  HENT: Negative.   Eyes: Negative.   Respiratory: Positive for cough and shortness of breath.   Cardiovascular: Negative.   Gastrointestinal: Negative.   Endocrine: Negative.   Genitourinary: Negative.   Musculoskeletal: Negative.   Skin: Positive for wound.  Allergic/Immunologic: Negative.   Neurological: Positive for dizziness and weakness.  Hematological: Negative.   Psychiatric/Behavioral: Negative.     History  Substance Use Topics  . Smoking status: Former Smoker -- 1.50 packs/day for 30 years    Types: Cigarettes    Quit date: 11/21/2008  . Smokeless tobacco: Former Systems developer    Quit date: 07/22/2008  . Alcohol Use: 0.0 oz/week    0 Standard drinks or equivalent per week     Comment: occasional   Objective:   BP 122/60 mmHg  Pulse 82  Temp(Src) 97.8 F (36.6 C) (Oral)  Resp 16  Wt 111 lb (50.349 kg)  SpO2 97%  Physical Exam   General Appearance:    Alert, cooperative, no distress, thin. In wheel chair but is now able to stand without assistance and take a few steps.   Eyes:    PERRL, conjunctiva/corneas clear, EOM's intact       Lungs:     Clear to auscultation bilaterally, respirations unlabored, minimal breath sounds  Heart:    Regular rate and rhythm  Neurologic:   Awake, alert, oriented x 3. No apparent focal neurological           defect.   Skin:   Small Grad II pressure ulcer right buttocks       Assessment & Plan:     1. Generlized weakness. Continue home physical therapy.   2. Pneumonia, unspecified laterality, unspecified part of lung  Recurrent, pneumonia. Clinically resolved.   3. Pulmonary emphysema, unspecified emphysema type Severe.  - tiotropium (SPIRIVA HANDIHALER) 18 MCG inhalation capsule; Place 1 capsule (18 mcg total) into inhaler and inhale daily.  Dispense: 90  capsule; Refill: 4 - Ambulatory referral to Pulmonology  4. Major depressive disorder, recurrent, severe without psychotic features Remains depressed, will titrate up on cymbalta and follow up in a month - DULoxetine (CYMBALTA) 30 MG capsule; Take 3 capsules (90 mg total) by mouth daily.  Dispense: 90 capsule; Refill: 1  5. Recurrent UTI Start prophylaxis - nitrofurantoin, macrocrystal-monohydrate, (MACROBID) 100 MG capsule; Take 1 capsule (100 mg total) by mouth daily.  Dispense: 30 capsule; Refill: 1  6. Hemoptysis  - Sputum culture  7. Abnormal PET scan, lung PET in April done due to mass on bronchoscopy. Follow up scan was recommended. Will get her back in with pulmonologist for follow up of severe COPD and mass.  - Ambulatory referral to Pulmonology  8. Pressure ulcer stage 2 Home health to follow.         Lelon Huh, MD  [facility]

## 2015-02-16 ENCOUNTER — Other Ambulatory Visit: Payer: Self-pay | Admitting: Family Medicine

## 2015-02-16 LAB — LOWER RESPIRATORY CULTURE

## 2015-02-17 ENCOUNTER — Telehealth: Payer: Self-pay | Admitting: Family Medicine

## 2015-02-18 ENCOUNTER — Other Ambulatory Visit: Payer: Self-pay | Admitting: Family Medicine

## 2015-02-27 ENCOUNTER — Telehealth: Payer: Self-pay

## 2015-02-27 NOTE — Telephone Encounter (Signed)
Spoke with Mickel Baas from [facility], regarding the note below,she stated that her services started on July 14 th, she was not able to tell me about the assessments; she also stated that her last nurse visit was on 02/24/2015, and that  the nurse will see her tomorrow, Tuesday and Friday and next week it will be once a week nurse visits.  Thanks,

## 2015-02-27 NOTE — Telephone Encounter (Signed)
-----   Message from Birdie Sons, MD sent at 02/27/2015  8:11 AM EDT ----- Regarding: Home Health Patient was recently seen with pressure ulcer on right buttocks. She is supposed to be getting Home Health through [facility] and need to make sure they are checking on wound. Can you please contact [facility] and see if they are checking this, or if they need any orders for wound care. Thanks.

## 2015-03-06 ENCOUNTER — Other Ambulatory Visit: Payer: Self-pay | Admitting: Specialist

## 2015-03-06 DIAGNOSIS — R918 Other nonspecific abnormal finding of lung field: Secondary | ICD-10-CM

## 2015-03-13 ENCOUNTER — Ambulatory Visit: Payer: BLUE CROSS/BLUE SHIELD

## 2015-03-14 ENCOUNTER — Other Ambulatory Visit: Payer: Self-pay | Admitting: Family Medicine

## 2015-03-14 NOTE — Telephone Encounter (Signed)
Tiffany Mclean patient 

## 2015-03-14 NOTE — Telephone Encounter (Signed)
Please see refill request.

## 2015-03-17 ENCOUNTER — Ambulatory Visit: Payer: BLUE CROSS/BLUE SHIELD | Attending: Specialist

## 2015-03-31 ENCOUNTER — Telehealth: Payer: Self-pay | Admitting: Family Medicine

## 2015-03-31 NOTE — Telephone Encounter (Signed)
Please advise verbal orders? 

## 2015-03-31 NOTE — Telephone Encounter (Signed)
Tiffany Mclean given verbal orders.

## 2015-03-31 NOTE — Telephone Encounter (Signed)
OK for PT twice a week for 4 weeks. Thanks.

## 2015-03-31 NOTE — Telephone Encounter (Signed)
Aislinn with [facility] would like verbal orders for PT twice a week for 4 weeks. Thanks TNP

## 2015-04-14 ENCOUNTER — Encounter: Payer: Self-pay | Admitting: Family Medicine

## 2015-04-16 ENCOUNTER — Other Ambulatory Visit: Payer: Self-pay | Admitting: Family Medicine

## 2015-04-17 ENCOUNTER — Telehealth: Payer: Self-pay | Admitting: Family Medicine

## 2015-04-17 NOTE — Telephone Encounter (Signed)
Please contact patient and advise that she is due for follow up office visit.

## 2015-04-17 NOTE — Telephone Encounter (Signed)
FYI.Marland KitchenMarland KitchenMarland KitchenJonni Sanger called to say they called to come out and she refused care.  Reports this is an on going situation.  She stated she was too tired.  This visit was 9/15th .  Pt is not being very compliant.  He has being seeing her for her oxygen levels and PT.  He requested that Dr. Caryn Section follow up.   His call back is  [phone]  Thanks Con Memos

## 2015-04-17 NOTE — Telephone Encounter (Signed)
Tiffany Mclean patient 

## 2015-04-17 NOTE — Telephone Encounter (Signed)
Please see refill request.

## 2015-04-18 NOTE — Telephone Encounter (Signed)
Patient stated that she will call back to schedule appt.

## 2015-04-18 NOTE — Telephone Encounter (Signed)
Left message on vm advising pt that she is due for f/u appt.

## 2015-04-20 ENCOUNTER — Telehealth: Payer: Self-pay | Admitting: Family Medicine

## 2015-04-20 NOTE — Telephone Encounter (Signed)
Crystal from [employer] called for an order to  eval for skilled need and for a Education officer, museum.  Her call back is   [phone]  Thanks Con Memos

## 2015-04-21 NOTE — Telephone Encounter (Signed)
Ok to order 

## 2015-04-21 NOTE — Telephone Encounter (Signed)
Order faxed to [facility] attention at [phone].

## 2015-04-21 NOTE — Telephone Encounter (Signed)
OK to order Education officer, museum and evaluated for need for skilled nursing.

## 2015-04-24 ENCOUNTER — Telehealth: Payer: Self-pay | Admitting: Family Medicine

## 2015-04-24 MED ORDER — AMOXICILLIN-POT CLAVULANATE 875-125 MG PO TABS: 1.0000 | ORAL_TABLET | Freq: Two times a day (BID) | ORAL | Status: AC

## 2015-04-24 MED ORDER — PREDNISONE 10 MG PO TABS: ORAL_TABLET | ORAL | Status: AC

## 2015-04-24 NOTE — Telephone Encounter (Signed)
Margarita Grizzle with [facility] is with pt right now and wanted to advise [place] that pt's BP is 122/80 heart rate is 105 and blood oxygen is 93 but when she was laying down it was 88. Pt has a great deal of congestion and is producing yellow sputum when she coughs.  Margarita Grizzle stated that pt hasn't been getting out of the bed and not answering her phone. Margarita Grizzle stated that if we would like to call back with orders to please contact the clinical manager at the office @ [phone]. Thanks TNP

## 2015-04-24 NOTE — Telephone Encounter (Signed)
Have sent rx for prednisone and antibiotic to [facility].

## 2015-04-24 NOTE — Telephone Encounter (Signed)
Patient notified

## 2015-04-28 ENCOUNTER — Ambulatory Visit: Payer: Self-pay | Admitting: Family Medicine

## 2015-04-28 ENCOUNTER — Telehealth: Payer: Self-pay | Admitting: Family Medicine

## 2015-04-28 NOTE — Telephone Encounter (Signed)
Asilyn with [facility] called to advise pt rescheduled the discharge for Monday 05/01/2015.  PQ#[phone]/RT

## 2015-04-28 NOTE — Telephone Encounter (Signed)
Spoke with Asilyn from [employer], she stated that pt was not seen today due pt having a Dr's appointment. She rescheduled her for Monday October the 10 th, she also stated that she is planning to discharge her from PT this coming Monday.  Thanks,

## 2015-04-28 NOTE — Telephone Encounter (Signed)
FYI, rescheduled to 05/02/2015.

## 2015-05-02 ENCOUNTER — Ambulatory Visit (INDEPENDENT_AMBULATORY_CARE_PROVIDER_SITE_OTHER): Payer: BLUE CROSS/BLUE SHIELD | Admitting: Family Medicine

## 2015-05-02 ENCOUNTER — Encounter: Payer: Self-pay | Admitting: Family Medicine

## 2015-05-02 VITALS — BP 114/68 | HR 87 | Temp 98.5°F | Resp 18 | Ht 60.0 in | Wt 110.0 lb

## 2015-05-02 DIAGNOSIS — F329 Major depressive disorder, single episode, unspecified: Secondary | ICD-10-CM | POA: Diagnosis not present

## 2015-05-02 DIAGNOSIS — G894 Chronic pain syndrome: Secondary | ICD-10-CM

## 2015-05-02 DIAGNOSIS — J9611 Chronic respiratory failure with hypoxia: Secondary | ICD-10-CM

## 2015-05-02 DIAGNOSIS — J439 Emphysema, unspecified: Secondary | ICD-10-CM | POA: Diagnosis not present

## 2015-05-02 DIAGNOSIS — Z23 Encounter for immunization: Secondary | ICD-10-CM | POA: Diagnosis not present

## 2015-05-02 DIAGNOSIS — M5030 Other cervical disc degeneration, unspecified cervical region: Secondary | ICD-10-CM

## 2015-05-02 DIAGNOSIS — F410 Panic disorder [episodic paroxysmal anxiety] without agoraphobia: Secondary | ICD-10-CM

## 2015-05-02 DIAGNOSIS — R29898 Other symptoms and signs involving the musculoskeletal system: Secondary | ICD-10-CM | POA: Diagnosis not present

## 2015-05-02 DIAGNOSIS — L309 Dermatitis, unspecified: Secondary | ICD-10-CM

## 2015-05-02 DIAGNOSIS — F32A Depression, unspecified: Secondary | ICD-10-CM

## 2015-05-02 MED ORDER — TRIAMCINOLONE ACETONIDE 0.5 % EX OINT: 1.0000 "application " | TOPICAL_OINTMENT | Freq: Two times a day (BID) | CUTANEOUS | Status: DC

## 2015-05-02 MED ORDER — MORPHINE SULFATE 15 MG PO TABS: 7.5000 mg | ORAL_TABLET | ORAL | Status: DC | PRN

## 2015-05-02 MED ORDER — ALPRAZOLAM 0.25 MG PO TABS: 0.2500 mg | ORAL_TABLET | Freq: Two times a day (BID) | ORAL | Status: DC | PRN

## 2015-05-02 MED ORDER — ALBUTEROL SULFATE HFA 108 (90 BASE) MCG/ACT IN AERS: 2.0000 | INHALATION_SPRAY | Freq: Four times a day (QID) | RESPIRATORY_TRACT | Status: DC | PRN

## 2015-05-02 MED ORDER — ACYCLOVIR 5 % EX OINT: 1.0000 "application " | TOPICAL_OINTMENT | CUTANEOUS | Status: DC

## 2015-05-02 NOTE — Progress Notes (Signed)
Patient: Tiffany Mclean Female    DOB: 06-Oct-1950   64 y.o.   MRN: [id number] Visit Date: 05/02/2015  Today's Provider: Lelon Huh, MD   Chief Complaint  Patient presents with  . COPD    follow up   Subjective:    HPI  Follow up COPD: Patient comes in today stating she is following up on "everything". She states her breathing has been good although she has had some chest congestion. Patient is currently on 2 Liters of 02 nasal canula. She is . Patient states she has had a productive cough with yellow and green colored sputum. Patient reports fever of 101 two days ago and was started on  Augmentin. She has had no fevers for the last 24 hours. She has been using Proair which she feels is not working as well as Ventolin which she wants to change to. She continues routine follow up with Dr. Raul Del.   LE: Weakness  She has been doing home PT for the last few months and is now able to get around home for most ADLs. She is not to her baseline.   Chronic pain History of fibromyalgia and DDD. States that current morphine regiment remains effective and requires refill.   Panic disorder: Continues on regular doses of alprazolam which she feels has helped a lot. Has been able to participate in PT and not as anxious to get out of house to come to office today.     Allergies  Allergen Reactions  . Asa [Aspirin]   . Avelox [Moxifloxacin]   . Cephalexin   . Codeine   . Erythromycin Ethylsuccinate   . Flagyl [Metronidazole]   . Latex   . Levaquin [Levofloxacin] Hives  . Meloxicam   . Shellfish Allergy Nausea And Vomiting and Other (See Comments)    Dizzy, fainting  . Sulfa Antibiotics   . Topiramate   . Doxycycline Rash  . Tetracyclines & Related Rash   Previous Medications   ACETAMINOPHEN (RA ACETAMINOPHEN) 650 MG CR TABLET    Take 650 mg by mouth.   ALBUTEROL (ACCUNEB) 1.25 MG/3ML NEBULIZER SOLUTION    Take 1 ampule by nebulization 4 (four) times daily as needed for  shortness of breath.   ALBUTEROL (PROAIR HFA) 108 (90 BASE) MCG/ACT INHALER    Inhale 2 puffs into the lungs every 6 (six) hours as needed.   ALPRAZOLAM (XANAX) 0.25 MG TABLET    Take 1 tablet (0.25 mg total) by mouth 2 (two) times daily as needed for anxiety or sleep.   AMOXICILLIN-CLAVULANATE (AUGMENTIN) 875-125 MG TABLET    Take 1 tablet by mouth 2 (two) times daily.   ATORVASTATIN (LIPITOR) 40 MG TABLET    1 (ONE) TABLET, ORAL, DAILY BY MOUTH   BUDESONIDE-FORMOTEROL (SYMBICORT) 160-4.5 MCG/ACT INHALER    Inhale 2 puffs into the lungs 2 (two) times daily.   [facility] ALLERGY RELIEF-D 5-120 MG PER TABLET    TAKE 1 TABLET BY MOUTH 3 TIMES A DAY AS NEEDED   DULOXETINE (CYMBALTA) 30 MG CAPSULE    Take 3 capsules (90 mg total) by mouth daily.   FLUTICASONE (FLONASE) 50 MCG/ACT NASAL SPRAY    Place 1 spray into both nostrils daily.   FOLIC [id number] (FOLIC ACID XTRA) TABS    Take 1 tablet by mouth daily.   IPRATROPIUM (ATROVENT) 0.02 % NEBULIZER SOLUTION    Take 0.5 mg by nebulization every 6 (six) hours as needed for wheezing or shortness of breath.  MORPHINE (MSIR) 15 MG TABLET    Take 0.5 tablets (7.5 mg total) by mouth every 4 (four) hours as needed for moderate pain or severe pain ((4-6/10)).   OXYGEN    Inhale 2 L into the lungs. Per minute   POLYVINYL ALCOHOL-POVIDONE (CLEAR EYES ALL SEASONS OP)    Apply to eye as needed.   RABEPRAZOLE (ACIPHEX) 20 MG TABLET    TAKE 1 TABLET BY MOUTH EVERY DAY   SACCHAROMYCES BOULARDII (FLORASTOR) 250 MG CAPSULE    Take 250 mg by mouth 2 (two) times daily.   SENNA (SENOKOT) 8.6 MG TABS TABLET    Take 1 tablet by mouth daily as needed for mild constipation.   TIOTROPIUM (SPIRIVA HANDIHALER) 18 MCG INHALATION CAPSULE    Place 1 capsule (18 mcg total) into inhaler and inhale daily.    Review of Systems  Constitutional: Positive for fever, chills and diaphoresis. Negative for appetite change and fatigue.  HENT: Positive for rhinorrhea and sneezing.     Eyes: Positive for itching.  Respiratory: Positive for cough, shortness of breath and wheezing. Negative for chest tightness.   Cardiovascular: Negative for chest pain and palpitations.  Gastrointestinal: Positive for abdominal pain. Negative for nausea and vomiting.  Musculoskeletal: Positive for back pain (worsened).  Neurological: Negative for dizziness and weakness.    Social History  Substance Use Topics  . Smoking status: Former Smoker -- 1.50 packs/day for 30 years    Types: Cigarettes    Quit date: 11/21/2008  . Smokeless tobacco: Former Systems developer    Quit date: 07/22/2008  . Alcohol Use: No   Objective:   BP 114/68 mmHg  Pulse 87  Temp(Src) 98.5 F (36.9 C) (Oral)  Resp 18  Ht 5' (1.524 m)  Wt 110 lb (49.896 kg)  BMI 21.48 kg/m2  SpO2 98%  Physical Exam   General Appearance:    Alert, cooperative, no distress  Eyes:    PERRL, conjunctiva/corneas clear, EOM's intact       Lungs:     Clear to auscultation bilaterally, diminished breath sounds bilaterally.   Heart:    Regular rate and rhythm  Neurologic:   Awake, alert, oriented x 3. No apparent focal neurological           defect.           Assessment & Plan:     .1. Chronic pain associated with significant psychosocial dysfunction Fairly well controlled on current morphine regiment - morphine (MSIR) 15 MG tablet; Take 0.5 tablets (7.5 mg total) by mouth every 4 (four) hours as needed for moderate pain or severe pain ((4-6/10)).  Dispense: 100 tablet; Refill: 0  2. Pulmonary emphysema, unspecified emphysema type ([place]) Stable. Continue maintenance inhalers and routine follow up with Dr. Raul Del  3. Chronic respiratory failure with hypoxia (HCC)  - albuterol (PROVENTIL HFA;VENTOLIN HFA) 108 (90 BASE) MCG/ACT inhaler; Inhale 2 puffs into the lungs every 6 (six) hours as needed for wheezing or shortness of breath.  Dispense: 3 Inhaler; Refill: 3  4. DDD (degenerative disc disease), cervical   5. Weakness of both  lower extremities Slowly improving with PT and improved pulmonary function.   6. Panic disorder Doing better. Continue alprazolam.  - ALPRAZolam (XANAX) 0.25 MG tablet; Take 1 tablet (0.25 mg total) by mouth 2 (two) times daily as needed for anxiety or sleep.  Dispense: 30 tablet; Refill: 5  7. Dermatitis She requests refills for triamcinolone and acyclovir.  - triamcinolone ointment (KENALOG) 0.5 %; Apply 1  application topically 2 (two) times daily.  Dispense: 30 g; Refill: 1 - acyclovir ointment (ZOVIRAX) 5 %; Apply 1 application topically every 3 (three) hours.  Dispense: 15 g; Refill: 1  8. Depression Stable on current dose of duloxetine\  9. Need for Tdap vaccination  - Tdap vaccine greater than or equal to 7yo IM  9. Need for influenza vaccination  - Flu vaccine HIGH DOSE PF   Addressed extensive list of chronic medical problems today requiring extensive time in counseling and coordination care.  Over half of this 50 minute visit were spent in counseling and coordinating care of multiple medical problems.       Lelon Huh, MD  [facility]

## 2015-05-15 ENCOUNTER — Encounter: Payer: Self-pay | Admitting: Family Medicine

## 2015-05-15 DIAGNOSIS — F32A Depression, unspecified: Secondary | ICD-10-CM | POA: Insufficient documentation

## 2015-05-15 DIAGNOSIS — F329 Major depressive disorder, single episode, unspecified: Secondary | ICD-10-CM | POA: Insufficient documentation

## 2015-05-23 ENCOUNTER — Other Ambulatory Visit: Payer: Self-pay | Admitting: Family Medicine

## 2015-05-23 ENCOUNTER — Other Ambulatory Visit: Payer: Self-pay | Admitting: *Deleted

## 2015-05-23 DIAGNOSIS — F332 Major depressive disorder, recurrent severe without psychotic features: Secondary | ICD-10-CM

## 2015-05-23 MED ORDER — DULOXETINE HCL 30 MG PO CPEP: 90.0000 mg | ORAL_CAPSULE | ORAL | Status: DC

## 2015-05-23 MED ORDER — RABEPRAZOLE SODIUM 20 MG PO TBEC: 20.0000 mg | DELAYED_RELEASE_TABLET | Freq: Every day | ORAL | Status: DC

## 2015-05-23 NOTE — Telephone Encounter (Signed)
Requesting 90 day supply.

## 2015-06-12 ENCOUNTER — Encounter: Payer: Self-pay | Admitting: Family Medicine

## 2015-06-12 ENCOUNTER — Telehealth: Payer: Self-pay | Admitting: Family Medicine

## 2015-06-12 ENCOUNTER — Ambulatory Visit (INDEPENDENT_AMBULATORY_CARE_PROVIDER_SITE_OTHER): Payer: BLUE CROSS/BLUE SHIELD | Admitting: Family Medicine

## 2015-06-12 VITALS — BP 96/64 | HR 108 | Temp 98.0°F | Resp 20 | Wt 109.0 lb

## 2015-06-12 DIAGNOSIS — N63 Unspecified lump in unspecified breast: Secondary | ICD-10-CM

## 2015-06-12 DIAGNOSIS — F410 Panic disorder [episodic paroxysmal anxiety] without agoraphobia: Secondary | ICD-10-CM | POA: Diagnosis not present

## 2015-06-12 DIAGNOSIS — L03317 Cellulitis of buttock: Secondary | ICD-10-CM | POA: Diagnosis not present

## 2015-06-12 DIAGNOSIS — R251 Tremor, unspecified: Secondary | ICD-10-CM | POA: Diagnosis not present

## 2015-06-12 DIAGNOSIS — L89302 Pressure ulcer of unspecified buttock, stage 2: Secondary | ICD-10-CM | POA: Diagnosis not present

## 2015-06-12 DIAGNOSIS — J439 Emphysema, unspecified: Secondary | ICD-10-CM | POA: Diagnosis not present

## 2015-06-12 DIAGNOSIS — Z1239 Encounter for other screening for malignant neoplasm of breast: Secondary | ICD-10-CM

## 2015-06-12 DIAGNOSIS — R29898 Other symptoms and signs involving the musculoskeletal system: Secondary | ICD-10-CM

## 2015-06-12 MED ORDER — MUPIROCIN CALCIUM 2 % EX CREA: 1.0000 "application " | TOPICAL_CREAM | Freq: Two times a day (BID) | CUTANEOUS | Status: DC

## 2015-06-12 MED ORDER — PRIMIDONE 50 MG PO TABS: ORAL_TABLET | ORAL | Status: DC

## 2015-06-12 NOTE — Progress Notes (Signed)
Patient: Tiffany Mclean Female    DOB: 05-12-1951   64 y.o.   MRN: [id number] Visit Date: 06/12/2015  Today's Provider: Lelon Huh, MD   Chief Complaint  Patient presents with  . Sore    on both side of buttocks  . Breast Mass    in left breast   Subjective:    HPI  Skin Lesion: Patient comes in today stating he has multiple sore on both sides of her buttocks. Patient states the sores have been there for several months and are worsening. Patient states the skin lesions are extremely itchy and sometimes bleed. Patient has also had pain at the site of the lesions.   Breast Mass: Patient complains of a lump in her left breast. She states it has been there for several months. Patient states the lump in unchanged in size since onset. Patient reports she has a history of breast implants. Patient states she had a Mammogram 01/2015 at [facility] at breast became much more swollen and hard to the touch afterwords. She states that she had similar episode when she silicone implants several years and had to have them replaced with saline.    Follow up chronic conditions: Patient with severe COPD followed by Dr. Raul Del, with associated anxiety and panic disorder. She had been bedtime for months until home health started coming and doing physical therapy for physical and pulmonary conditions. However her husband states home health stopped coming out several weeks ago and she is no longer able to get herself out of bed  She also has history of benign essential tremors previously treated with primidone. She states she took around 250mg  a day in the past which was effective. Tremors have recently started up again and she requests to start back on medication.     Allergies  Allergen Reactions  . Asa [Aspirin]   . Avelox [Moxifloxacin]   . Cephalexin   . Codeine   . Erythromycin Ethylsuccinate   . Flagyl [Metronidazole]   . Latex   . Levaquin [Levofloxacin] Hives  .  Meloxicam   . Shellfish Allergy Nausea And Vomiting and Other (See Comments)    Dizzy, fainting  . Sulfa Antibiotics   . Topiramate   . Doxycycline Rash  . Tetracyclines & Related Rash   Previous Medications   ACETAMINOPHEN (RA ACETAMINOPHEN) 650 MG CR TABLET    Take 650 mg by mouth.   ACYCLOVIR OINTMENT (ZOVIRAX) 5 %    Apply 1 application topically every 3 (three) hours.   ALBUTEROL (ACCUNEB) 1.25 MG/3ML NEBULIZER SOLUTION    Take 1 ampule by nebulization 4 (four) times daily as needed for shortness of breath.   ALBUTEROL (PROVENTIL HFA;VENTOLIN HFA) 108 (90 BASE) MCG/ACT INHALER    Inhale 2 puffs into the lungs every 6 (six) hours as needed for wheezing or shortness of breath.   ALPRAZOLAM (XANAX) 0.25 MG TABLET    Take 1 tablet (0.25 mg total) by mouth 2 (two) times daily as needed for anxiety or sleep.   ATORVASTATIN (LIPITOR) 40 MG TABLET    1 (ONE) TABLET, ORAL, DAILY BY MOUTH   BUDESONIDE-FORMOTEROL (SYMBICORT) 160-4.5 MCG/ACT INHALER    Inhale 2 puffs into the lungs 2 (two) times daily.   [facility] ALLERGY RELIEF-D 5-120 MG PER TABLET    TAKE 1 TABLET BY MOUTH 3 TIMES A DAY AS NEEDED   DULOXETINE (CYMBALTA) 30 MG CAPSULE    Take 3 capsules (90 mg total) by mouth daily.  FLUTICASONE (FLONASE) 50 MCG/ACT NASAL SPRAY    Place 1 spray into both nostrils daily.   FOLIC [id number] (FOLIC ACID XTRA) TABS    Take 1 tablet by mouth daily.   IPRATROPIUM (ATROVENT) 0.02 % NEBULIZER SOLUTION    Take 0.5 mg by nebulization every 6 (six) hours as needed for wheezing or shortness of breath.   MORPHINE (MSIR) 15 MG TABLET    Take 0.5 tablets (7.5 mg total) by mouth every 4 (four) hours as needed for moderate pain or severe pain ((4-6/10)).   OXYGEN    Inhale 2 L into the lungs. Per minute   POLYVINYL ALCOHOL-POVIDONE (CLEAR EYES ALL SEASONS OP)    Apply to eye as needed.   RABEPRAZOLE (ACIPHEX) 20 MG TABLET    Take 1 tablet (20 mg total) by mouth daily.   SACCHAROMYCES BOULARDII  (FLORASTOR) 250 MG CAPSULE    Take 250 mg by mouth 2 (two) times daily.   SENNA (SENOKOT) 8.6 MG TABS TABLET    Take 1 tablet by mouth daily as needed for mild constipation.   TIOTROPIUM (SPIRIVA HANDIHALER) 18 MCG INHALATION CAPSULE    Place 1 capsule (18 mcg total) into inhaler and inhale daily.   TRIAMCINOLONE OINTMENT (KENALOG) 0.5 %    Apply 1 application topically 2 (two) times daily.    Review of Systems  Constitutional: Negative for fever, chills, appetite change and fatigue.  Respiratory: Negative for chest tightness and shortness of breath.   Cardiovascular: Negative for chest pain and palpitations.  Gastrointestinal: Negative for nausea, vomiting and abdominal pain.  Skin: Positive for wound.  Neurological: Negative for dizziness and weakness.    Social History  Substance Use Topics  . Smoking status: Former Smoker -- 1.50 packs/day for 30 years    Types: Cigarettes    Quit date: 11/21/2008  . Smokeless tobacco: Former Systems developer    Quit date: 07/22/2008  . Alcohol Use: No   Objective:   BP 96/64 mmHg  Pulse 108  Temp(Src) 98 F (36.7 C) (Oral)  Resp 20  Wt 109 lb (49.442 kg)  SpO2 97%  Physical Exam  General appearance: alert, well developed, well nourished, cooperative and in no distress Head: Normocephalic, without obvious abnormality, atraumatic Breast: Left breast moderated swollen, tender and deformed, hard to touch. Normal consistency and size with no lumps on right breast Extremities: No gross deformities Skin: Several small (<1cm) stage II decubiti on both buttocks with scant amount yellow discharge. Psych: Appropriate mood and affect. Neurologic: Mental status: Alert, oriented to person, place, and time, thought content appropriate. Moderate tremor of both hands with intention. No cogwheeling.      Assessment & Plan:     1. Cellulitis of multiple sites of buttock  - mupirocin cream (BACTROBAN) 2 %; Apply 1 application topically 2 (two) times daily.   Dispense: 15 g; Refill: 1 - Aerobic Culture  2. Breast mass  - US BREAST COMPLETE UNI LEFT INC AXILLA; Future  3. Tremor Did well on primidone in the past - primidone (MYSOLINE) 50 MG tablet; 1/2 tablet at bedtime for 6 days, then increase to 1 tablet at bedtime for 6 days, then increase to 2 tablets at bedtime  Dispense: 60 tablet; Refill: 1  4. Pulmonary emphysema, unspecified emphysema type (HCC) Severe. Continue current inhalers and follow up with pulmonary  5. Panic disorder   6. Weakness of both lower extremities Multifactorial. Needs to get back on home PT as she has been bed bound since it was  discontinued  7. Bed sore on buttock, unspecified laterality, stage II To keep sores covered with bandages at all times until healed. Will see if home health can follow.   Addressed extensive list of chronic and acute medical problems today requiring extensive time in counseling and coordination care.  Over half of this 50 minute visit were spent in counseling and coordinating care of multiple medical problems.       Lelon Huh, MD  [facility]

## 2015-06-12 NOTE — Telephone Encounter (Signed)
[facility] states that when scheduling a breast ultrasound pt must also get a diagnostic mammogram.Pt is refusing this because she thinks the last mammogram caused the breast mass.[facility]  Imaging suggest a breast MRI

## 2015-06-13 ENCOUNTER — Telehealth: Payer: Self-pay | Admitting: Family Medicine

## 2015-06-13 DIAGNOSIS — R251 Tremor, unspecified: Secondary | ICD-10-CM | POA: Insufficient documentation

## 2015-06-13 MED ORDER — AMOXICILLIN-POT CLAVULANATE 875-125 MG PO TABS: 1.0000 | ORAL_TABLET | Freq: Two times a day (BID) | ORAL | Status: AC

## 2015-06-13 MED ORDER — ATORVASTATIN CALCIUM 40 MG PO TABS: ORAL_TABLET | ORAL | Status: DC

## 2015-06-13 NOTE — Telephone Encounter (Signed)
[employer] is requesting peer to peer to get MRI of breast approved.Phone [phone].Member # [id number]

## 2015-06-13 NOTE — Telephone Encounter (Signed)
OK for left breast MRI. I don't know if it needs to be ordered with contrast or not. Need to rule out ruptured breast implant.

## 2015-06-13 NOTE — Addendum Note (Signed)
Addended by: Birdie Sons on: 06/13/2015 01:36 PM   Modules accepted: Orders

## 2015-06-13 NOTE — Telephone Encounter (Signed)
[facility] [place] and [facility] is missing several medications missing   Lipitor and the antibiotic she was prescribed is missing   Please check and see if these were sent.  Call back is [phone]  Thanks Con Memos

## 2015-06-14 ENCOUNTER — Telehealth: Payer: Self-pay | Admitting: *Deleted

## 2015-06-14 LAB — AEROBIC CULTURE

## 2015-06-14 MED ORDER — CLINDAMYCIN HCL 300 MG PO CAPS: 300.0000 mg | ORAL_CAPSULE | Freq: Three times a day (TID) | ORAL | Status: DC

## 2015-06-14 NOTE — Telephone Encounter (Signed)
Please schedule MRI

## 2015-06-14 NOTE — Telephone Encounter (Signed)
-----   Message from Birdie Sons, MD sent at 06/14/2015  1:14 PM EST ----- Please advise patient that culture is positive for MRSA. Need to continue mupiricin cream and start clindamycin 300mg  three times a day for 10 days.

## 2015-06-14 NOTE — Telephone Encounter (Signed)
MRI ordered

## 2015-06-14 NOTE — Telephone Encounter (Signed)
Patient notified of results. Patient expressed understanding. Rx sent to pharmacy.  

## 2015-06-19 ENCOUNTER — Telehealth: Payer: Self-pay | Admitting: Family Medicine

## 2015-06-19 NOTE — Telephone Encounter (Signed)
[employer] would like peer to peer to get MRI approved.See message from 06/13/15

## 2015-06-23 ENCOUNTER — Telehealth: Payer: Self-pay

## 2015-06-23 NOTE — Telephone Encounter (Signed)
That's fine

## 2015-06-23 NOTE — Telephone Encounter (Signed)
Tiffany Mclean was notified.

## 2015-06-23 NOTE — Telephone Encounter (Signed)
Tiffany Mclean from [facility] called wanting to get a verbal order for PT and OT to help with walking and mobility for at least 2 times a week. Ok to order?

## 2015-06-26 ENCOUNTER — Telehealth: Payer: Self-pay | Admitting: Family Medicine

## 2015-06-26 NOTE — Telephone Encounter (Signed)
Dawn wanted to advise that pt missed a visit on 06/21/15 and hasn't been able to reach pt by phone today but Dawn did see pt on Friday 06/23/15. Thanks TNP

## 2015-06-27 ENCOUNTER — Telehealth: Payer: Self-pay | Admitting: Family Medicine

## 2015-06-27 DIAGNOSIS — B9562 Methicillin resistant Staphylococcus aureus infection as the cause of diseases classified elsewhere: Secondary | ICD-10-CM | POA: Diagnosis not present

## 2015-06-27 DIAGNOSIS — L03317 Cellulitis of buttock: Secondary | ICD-10-CM | POA: Diagnosis not present

## 2015-06-27 DIAGNOSIS — J449 Chronic obstructive pulmonary disease, unspecified: Secondary | ICD-10-CM | POA: Diagnosis not present

## 2015-06-27 DIAGNOSIS — M797 Fibromyalgia: Secondary | ICD-10-CM | POA: Diagnosis not present

## 2015-06-27 NOTE — Telephone Encounter (Signed)
Aislinn with [facility] would like to get verbal orders for PT with pt twice a week for 4 weeks. Thanks TNP

## 2015-06-28 NOTE — Telephone Encounter (Signed)
Left detailed message on Tiffany Mclean's vm approving PT.

## 2015-06-28 NOTE — Telephone Encounter (Signed)
OK for PT twice a week for 4 weeks.

## 2015-07-05 ENCOUNTER — Other Ambulatory Visit: Payer: Self-pay | Admitting: *Deleted

## 2015-07-05 MED ORDER — ATORVASTATIN CALCIUM 40 MG PO TABS: ORAL_TABLET | ORAL | Status: DC

## 2015-07-05 NOTE — Telephone Encounter (Signed)
Received fax requesting 90 day supply of atorvastatin 40 mg.

## 2015-07-10 LAB — HM MAMMOGRAPHY

## 2015-07-11 ENCOUNTER — Telehealth: Payer: Self-pay | Admitting: Family Medicine

## 2015-07-11 NOTE — Telephone Encounter (Signed)
fyi

## 2015-07-11 NOTE — Telephone Encounter (Signed)
FYI.Marland KitchenMarland KitchenMarland KitchenMarland KitchenDawn with [facility] called to report a missed visit. She says the day they came Ms. Arrambide had a doctors appt then went shopping with her family.  Thanks,  [employer]

## 2015-07-12 ENCOUNTER — Other Ambulatory Visit: Payer: Self-pay | Admitting: *Deleted

## 2015-07-12 ENCOUNTER — Other Ambulatory Visit: Payer: Self-pay | Admitting: Family Medicine

## 2015-07-12 DIAGNOSIS — R251 Tremor, unspecified: Secondary | ICD-10-CM

## 2015-07-12 MED ORDER — PRIMIDONE 50 MG PO TABS: ORAL_TABLET | ORAL | Status: DC

## 2015-07-12 NOTE — Telephone Encounter (Signed)
Requesting 90 day supply.

## 2015-07-21 ENCOUNTER — Other Ambulatory Visit: Payer: Self-pay | Admitting: Family Medicine

## 2015-07-21 DIAGNOSIS — G894 Chronic pain syndrome: Secondary | ICD-10-CM

## 2015-07-21 NOTE — Telephone Encounter (Signed)
Pt contacted office for refill request on the following medications:  morphine (MSIR) 15 MG tablet.  [id number]

## 2015-07-25 ENCOUNTER — Encounter: Payer: Self-pay | Admitting: *Deleted

## 2015-07-25 MED ORDER — MORPHINE SULFATE 15 MG PO TABS: 7.5000 mg | ORAL_TABLET | ORAL | Status: DC | PRN

## 2015-08-23 ENCOUNTER — Telehealth: Payer: Self-pay | Admitting: Family Medicine

## 2015-08-23 NOTE — Telephone Encounter (Signed)
He needs to bring in for follow up office visit.

## 2015-08-23 NOTE — Telephone Encounter (Signed)
Patient's husband [name] was notified. Scheduled appt 07/25/2015 at 4:30.

## 2015-08-23 NOTE — Telephone Encounter (Signed)
Pt husband, [name] called stating pt is sleeping all the time, not able to get out of bed and not eating. Pt is having a low grade fever and having cold sweats.   He states pt does not always make make sense when he ask her a question.  He is very concerned with pt.  He is requesting home health come out to see pt a couple days a week.  Pt states home health was coming out before and pt seem to do better when they were coming out each week. Pt husband states if home health can not come this week he will bring pt in if he has to.  [facility].  [phone]

## 2015-08-25 ENCOUNTER — Emergency Department: Payer: BLUE CROSS/BLUE SHIELD

## 2015-08-25 ENCOUNTER — Emergency Department
Admission: EM | Admit: 2015-08-25 | Discharge: 2015-08-25 | Disposition: A | Discharge status: Home / Self Care | Payer: BLUE CROSS/BLUE SHIELD | Attending: Emergency Medicine | Admitting: Emergency Medicine

## 2015-08-25 ENCOUNTER — Encounter: Payer: Self-pay | Admitting: Emergency Medicine

## 2015-08-25 ENCOUNTER — Ambulatory Visit: Payer: Self-pay | Admitting: Family Medicine

## 2015-08-25 DIAGNOSIS — Z91040 Latex allergy status: Secondary | ICD-10-CM | POA: Insufficient documentation

## 2015-08-25 DIAGNOSIS — Z9981 Dependence on supplemental oxygen: Secondary | ICD-10-CM | POA: Insufficient documentation

## 2015-08-25 DIAGNOSIS — E119 Type 2 diabetes mellitus without complications: Secondary | ICD-10-CM | POA: Insufficient documentation

## 2015-08-25 DIAGNOSIS — J159 Unspecified bacterial pneumonia: Secondary | ICD-10-CM | POA: Diagnosis not present

## 2015-08-25 DIAGNOSIS — Z87891 Personal history of nicotine dependence: Secondary | ICD-10-CM | POA: Diagnosis not present

## 2015-08-25 DIAGNOSIS — Z79899 Other long term (current) drug therapy: Secondary | ICD-10-CM | POA: Diagnosis not present

## 2015-08-25 DIAGNOSIS — Z7951 Long term (current) use of inhaled steroids: Secondary | ICD-10-CM | POA: Insufficient documentation

## 2015-08-25 DIAGNOSIS — J40 Bronchitis, not specified as acute or chronic: Secondary | ICD-10-CM

## 2015-08-25 DIAGNOSIS — J209 Acute bronchitis, unspecified: Secondary | ICD-10-CM | POA: Insufficient documentation

## 2015-08-25 DIAGNOSIS — R05 Cough: Secondary | ICD-10-CM | POA: Diagnosis present

## 2015-08-25 DIAGNOSIS — Z792 Long term (current) use of antibiotics: Secondary | ICD-10-CM | POA: Diagnosis not present

## 2015-08-25 DIAGNOSIS — J189 Pneumonia, unspecified organism: Secondary | ICD-10-CM

## 2015-08-25 LAB — CBC WITH DIFFERENTIAL/PLATELET
BAND NEUTROPHILS: 0 %
BLASTS: 0 %
Basophils Absolute: 0 10*3/uL (ref 0–0.1)
Basophils Relative: 0 %
Eosinophils Absolute: 0.4 10*3/uL (ref 0–0.7)
Eosinophils Relative: 3 %
HCT: 34.7 % — ABNORMAL LOW (ref 35.0–47.0)
HEMOGLOBIN: 10.6 g/dL — AB (ref 12.0–16.0)
LYMPHS PCT: 18 %
Lymphs Abs: 2.2 10*3/uL (ref 1.0–3.6)
MCH: 26.7 pg (ref 26.0–34.0)
MCHC: 30.5 g/dL — ABNORMAL LOW (ref 32.0–36.0)
MCV: 87.6 fL (ref 80.0–100.0)
MONOS PCT: 5 %
Metamyelocytes Relative: 1 %
Monocytes Absolute: 0.6 10*3/uL (ref 0.2–0.9)
Myelocytes: 1 %
NEUTROS ABS: 9.1 10*3/uL — AB (ref 1.4–6.5)
NEUTROS PCT: 72 %
NRBC: 0 /100{WBCs}
OTHER: 0 %
PLATELETS: 483 10*3/uL — AB (ref 150–440)
PROMYELOCYTES ABS: 0 %
RBC: 3.96 MIL/uL (ref 3.80–5.20)
RDW: 14.4 % (ref 11.5–14.5)
WBC: 12.3 10*3/uL — AB (ref 3.6–11.0)

## 2015-08-25 LAB — COMPREHENSIVE METABOLIC PANEL
ALT: 15 U/L (ref 14–54)
AST: 18 U/L (ref 15–41)
Albumin: 2.9 g/dL — ABNORMAL LOW (ref 3.5–5.0)
Alkaline Phosphatase: 104 U/L (ref 38–126)
Anion gap: 6 (ref 5–15)
BUN: 24 mg/dL — ABNORMAL HIGH (ref 6–20)
CHLORIDE: 93 mmol/L — AB (ref 101–111)
CO2: 42 mmol/L — AB (ref 22–32)
CREATININE: 0.55 mg/dL (ref 0.44–1.00)
Calcium: 9.8 mg/dL (ref 8.9–10.3)
GFR calc non Af Amer: >60 mL/min (ref >60)
Glucose, Bld: 119 mg/dL — ABNORMAL HIGH (ref 65–99)
POTASSIUM: 4.7 mmol/L (ref 3.5–5.1)
SODIUM: 141 mmol/L (ref 135–145)
Total Bilirubin: 0.2 mg/dL — ABNORMAL LOW (ref 0.3–1.2)
Total Protein: 7.9 g/dL (ref 6.5–8.1)

## 2015-08-25 LAB — TROPONIN I

## 2015-08-25 LAB — RAPID INFLUENZA A&B ANTIGENS (ARMC ONLY): INFLUENZA B (ARMC): NOT DETECTED

## 2015-08-25 LAB — BRAIN NATRIURETIC PEPTIDE: B Natriuretic Peptide: 46 pg/mL (ref 0.0–100.0)

## 2015-08-25 LAB — RAPID INFLUENZA A&B ANTIGENS: Influenza A (ARMC): NOT DETECTED

## 2015-08-25 LAB — LACTIC ACID, PLASMA: LACTIC ACID, VENOUS: 1.2 mmol/L (ref 0.5–2.0)

## 2015-08-25 LAB — TSH: TSH: 1.598 u[IU]/mL (ref 0.350–4.500)

## 2015-08-25 MED ORDER — AMOXICILLIN-POT CLAVULANATE 875-125 MG PO TABS: 1.0000 | ORAL_TABLET | Freq: Two times a day (BID) | ORAL | Status: AC

## 2015-08-25 MED ORDER — SODIUM CHLORIDE 0.9 % IV SOLN
Freq: Once | INTRAVENOUS | Status: AC
  Administered 2015-08-25: 21:00:00 via INTRAVENOUS

## 2015-08-25 MED ORDER — IOHEXOL 350 MG/ML SOLN
75.0000 mL | Freq: Once | INTRAVENOUS | Status: AC | PRN
  Administered 2015-08-25: 75 mL via INTRAVENOUS

## 2015-08-25 NOTE — ED Notes (Addendum)
Patient weaned from 3L [place] to 2L [place] due to sating at 100% and patient was comfortable. Patient still sating at 99%

## 2015-08-25 NOTE — ED Provider Notes (Signed)
[facility] Emergency Department Provider Note  ____________________________________________  Time seen: Approximately 6:31 PM  I have reviewed the triage vital signs and the nursing notes.   HISTORY  Chief Complaint Fatigue and Cough    HPI Tiffany Mclean is a 65 y.o. female patient repeatedly reports she feels tired aches all over inside and out and has a cough that has been going on she says for years but is getting worse now. She said that she had severe pneumonia one time and spent 5 months in the hospital carrying times. Spent time at [facility] for rehabilitation. She is afraid that this might be getting the same way. She denies any fever or chills does not really seem to be anymore shortness short of breath now than usually. She is usually on 2 L of oxygen at home which is increased for exertion and is doing well on 2 L of oxygen here.   Past Medical History  Diagnosis Date  . Diabetes mellitus without complication ([place])   . COPD (chronic obstructive pulmonary disease) ([place])   . Pneumonia   . 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 Tremor 06/13/2015  . Depression 05/15/2015  . Anxiety 02/13/2015  . Dermatitis, eczematoid 02/13/2015  . Hypokalemia 02/13/2015  . Lower extremity weakness 02/13/2015  . Leukocytosis 02/13/2015  . Magnesium deficiency 02/13/2015  . Symptomatic menopausal or female climacteric states 02/13/2015  . Panic disorder 02/13/2015  . Bed sore on buttock 02/08/2015  . COPD with emphysema ([place]) 11/28/2014  . Chronic respiratory failure with hypoxia ([place]) 11/28/2014  . GERD (gastroesophageal reflux disease) 11/28/2014  . Anemia of chronic disease 11/28/2014  . Pneumonia 11/23/2014  . Generalized anxiety disorder 11/23/2014  . Injury of kidney 10/31/2014  . Personal history of perinatal problems 10/24/2014  . Chronic pain associated with significant  psychosocial dysfunction 02/21/2010  . Anemia, iron deficiency 01/29/2010  . Allergic rhinitis 12/01/2009  . DDD (degenerative disc disease), cervical 12/01/2009  . 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 Abdominal pain, generalized 03/21/2009  . Vitamin D deficiency 07/22/2008  . Hypercholesterolemia without hypertriglyceridemia 03/11/2007  . Essential and other specified forms of tremor 07/22/1998    Past Surgical History  Procedure Laterality Date  . Cholecystectomy  12/20/2010    Laparoscopic, Dr. Marina Gravel, [facility]  . Abdominal hysterectomy  1996    [facility]. Dr. Ammie Dalton  . Tubal ligation  1986  . Breast surgery  1983    Breast Augmentation  . Eye surgery      1977  . Cervical spine surgery  1996, 1993  . Breast enhancement surgery      rupture    Current Outpatient Rx  Name  Route  Sig  Dispense  Refill  . acetaminophen (RA ACETAMINOPHEN) 650 MG CR tablet   Oral   Take 650 mg by mouth.         Marland Kitchen acyclovir ointment (ZOVIRAX) 5 %   Topical   Apply 1 application topically every 3 (three) hours.   15 g   1   . albuterol (ACCUNEB) 1.25 MG/3ML nebulizer solution   Nebulization   Take 1 ampule by nebulization 4 (four) times daily as needed for shortness of breath.         Marland Kitchen albuterol (PROVENTIL HFA;VENTOLIN HFA) 108 (90 BASE) MCG/ACT inhaler   Inhalation   Inhale 2 puffs into the lungs every 6 (six) hours as  needed for wheezing or shortness of breath.   3 Inhaler   3     Dispense as written.   Marland Kitchen ALPRAZolam (XANAX) 0.25 MG tablet   Oral   Take 1 tablet (0.25 mg total) by mouth 2 (two) times daily as needed for anxiety or sleep.   30 tablet   5   . amoxicillin-clavulanate (AUGMENTIN) 875-125 MG tablet   Oral   Take 1 tablet by mouth every 12 (twelve) hours.   14 tablet   0   . atorvastatin (LIPITOR) 40 MG tablet      1 (ONE) TABLET, ORAL, DAILY BY MOUTH   90 tablet   4   . budesonide-formoterol (SYMBICORT) 160-4.5 MCG/ACT inhaler   Inhalation   Inhale 2 puffs  into the lungs 2 (two) times daily.         . clindamycin (CLEOCIN) 300 MG capsule   Oral   Take 1 capsule (300 mg total) by mouth 3 (three) times daily.   30 capsule   0   . [employer] ALLERGY RELIEF-D 5-120 MG per tablet      TAKE 1 TABLET BY MOUTH 3 TIMES A DAY AS NEEDED   60 tablet   3   . DULoxetine (CYMBALTA) 30 MG capsule   Oral   Take 3 capsules (90 mg total) by mouth daily.   270 capsule   3   . fluticasone (FLONASE) 50 MCG/ACT nasal spray   Each Nare   Place 1 spray into both nostrils daily.         . Folic 123XX123 (FOLIC ACID XTRA) TABS   Oral   Take 1 tablet by mouth daily.         Marland Kitchen ipratropium (ATROVENT) 0.02 % nebulizer solution   Nebulization   Take 0.5 mg by nebulization every 6 (six) hours as needed for wheezing or shortness of breath.         . morphine (MSIR) 15 MG tablet   Oral   Take 0.5 tablets (7.5 mg total) by mouth every 4 (four) hours as needed for moderate pain or severe pain ((4-6/10)).   100 tablet   0   . mupirocin cream (BACTROBAN) 2 %   Topical   Apply 1 application topically 2 (two) times daily.   15 g   1   . Polyvinyl Alcohol-Povidone (CLEAR EYES ALL SEASONS OP)   Ophthalmic   Apply to eye as needed.         . primidone (MYSOLINE) 50 MG tablet      1/2 tablet at bedtime for 6 days, then increase to 1 tablet at bedtime for 6 days, then increase to 2 tablets at bedtime   180 tablet   4   . RABEprazole (ACIPHEX) 20 MG tablet   Oral   Take 1 tablet (20 mg total) by mouth daily.   90 tablet   3   . saccharomyces boulardii (FLORASTOR) 250 MG capsule   Oral   Take 250 mg by mouth 2 (two) times daily.         Marland Kitchen senna (SENOKOT) 8.6 MG TABS tablet   Oral   Take 1 tablet by mouth daily as needed for mild constipation.         Marland Kitchen tiotropium (SPIRIVA HANDIHALER) 18 MCG inhalation capsule   Inhalation   Place 1 capsule (18 mcg total) into inhaler and inhale daily.   90 capsule   4   . triamcinolone  ointment (KENALOG) 0.5 %  Topical   Apply 1 application topically 2 (two) times daily.   30 g   1     Allergies Asa; Avelox; Cephalexin; Codeine; Erythromycin ethylsuccinate; Flagyl; Latex; Levaquin; Meloxicam; Shellfish allergy; Sulfa antibiotics; Topiramate; Doxycycline; and Tetracyclines & related  Family History  Problem Relation Age of Onset  . Gout Mother   . Hypertension Mother   . Arthritis Mother   . Stroke Mother   . Kidney disease Mother   . CAD Mother   . Emphysema Father   . Gout Father   . Hypertension Father   . Kidney disease Father   . Arthritis Father   . Ulcers Father     Social History Social History  Substance Use Topics  . Smoking status: Former Smoker -- 1.50 packs/day for 30 years    Types: Cigarettes    Quit date: 11/21/2008  . Smokeless tobacco: Former Systems developer    Quit date: 07/22/2008  . Alcohol Use: No    Review of Systems Constitutional: No fever/chills Eyes: No visual changes. ENT: No sore throat. Cardiovascular: Denies chest pain. Respiratory: Denies shortness of breath. Gastrointestinal: No abdominal pain.  No nausea, no vomiting.  No diarrhea.  No constipation. Genitourinary: Negative for dysuria. Musculoskeletal: Negative for back pain. Skin: Negative for rash. Neurological: Negative for headaches, focal weakness or numbness.  10-point ROS otherwise negative.  ____________________________________________   PHYSICAL EXAM:  VITAL SIGNS: ED Triage Vitals  Enc Vitals Group     BP 08/25/15 1705 108/51 mmHg     Pulse Rate 08/25/15 1705 83     Resp 08/25/15 1705 18     Temp 08/25/15 1705 98.1 F (36.7 C)     Temp Source 08/25/15 1705 Oral     SpO2 08/25/15 1705 82 %     Weight 08/25/15 1705 113 lb (51.256 kg)     Height 08/25/15 1705 5\' 5"  (1.651 m)     Head Cir --      Peak Flow --      Pain Score 08/25/15 1729 5     Pain Loc --      Pain Edu? --      Excl. in [place]? --    Constitutional: Alert and oriented. Well  appearing and in no acute distress. Eyes: Conjunctivae are normal. PERRL. EOMI. Head: Atraumatic. Nose: No congestion/rhinnorhea. Mouth/Throat: Mucous membranes are moist.  Oropharynx non-erythematous. Neck: No stridor. Cardiovascular: Normal rate, regular rhythm. Grossly normal heart sounds.  Good peripheral circulation. Respiratory: Normal respiratory effort.  No retractions. Lungs CTAB. Gastrointestinal: Soft and nontender. No distention. No abdominal bruits. No CVA tenderness. Musculoskeletal: No lower extremity tenderness nor edema.  No joint effusions. Neurologic:  Normal speech and language. No gross focal neurologic deficits are appreciated. No gait instability. Skin:  Skin is warm, dry and intact. No rash noted.   ____________________________________________   LABS (all labs ordered are listed, but only abnormal results are displayed)  Labs Reviewed  COMPREHENSIVE METABOLIC PANEL - Abnormal; Notable for the following:    Chloride 93 (*)    CO2 42 (*)    Glucose, Bld 119 (*)    BUN 24 (*)    Albumin 2.9 (*)    Total Bilirubin 0.2 (*)    All other components within normal limits  CBC WITH DIFFERENTIAL/PLATELET - Abnormal; Notable for the following:    WBC 12.3 (*)    Hemoglobin 10.6 (*)    HCT 34.7 (*)    MCHC 30.5 (*)    Platelets 483 (*)  Neutro Abs 9.1 (*)    All other components within normal limits  RAPID INFLUENZA A&B ANTIGENS ([facility] ONLY)  TROPONIN I  LACTIC ACID, PLASMA  BRAIN NATRIURETIC PEPTIDE  TSH   ____________________________________________  EKG  EKG read and interpreted by me shows normal sinus rhythm at a rate of 79 normal axis no acute ST-T wave changes. ____________________________________________  RADIOLOGY  CT is read as new lingular atelectasis and the soft tissue mass below the left breast prosthesis. ____________________________________________   PROCEDURES    ____________________________________________   INITIAL  IMPRESSION / ASSESSMENT AND PLAN / ED COURSE  Pertinent labs & imaging results that were available during my care of the patient were reviewed by me and considered in my medical decision making (see chart for details).  Discussed with patient and husband. Patient has had the left breast lump since her last mammogram. MRI was done before which seemed indicated with soft tissue. Blood exam with the nurse present the lump is soft there is no redness warmth or tenderness in the area could be soft tissue or possibly a very small area of rupture of the prosthesis. But nothing that looks bad at this point. Patient and husband reports her blood pressure usually runs low now. But they cannot say how low. She reports her white blood count goes up and down often is often elevated they're often immature forms present. She however does feel bad and I will give her some Augmentin twice a day she says that that usually works on her she will return if worse and follow-up with her doctor. ____________________________________________   FINAL CLINICAL IMPRESSION(S) / ED DIAGNOSES  Final diagnoses:  Bronchitis  Community acquired pneumonia      Nena Polio, MD 08/25/15 2333

## 2015-08-25 NOTE — Discharge Instructions (Signed)
Community-Acquired Pneumonia, Adult Pneumonia is an infection of the lungs. One type of pneumonia can happen while a person is in a hospital. A different type can happen when a person is not in a hospital (community-acquired pneumonia). It is easy for this kind to spread from person to person. It can spread to you if you breathe near an infected person who coughs or sneezes. Some symptoms include:  A dry cough.  A wet (productive) cough.  Fever.  Sweating.  Chest pain. HOME CARE  Take over-the-counter and prescription medicines only as told by your doctor.  Only take cough medicine if you are losing sleep.  If you were prescribed an antibiotic medicine, take it as told by your doctor. Do not stop taking the antibiotic even if you start to feel better.  Sleep with your head and neck raised (elevated). You can do this by putting a few pillows under your head, or you can sleep in a recliner.  Do not use tobacco products. These include cigarettes, chewing tobacco, and e-cigarettes. If you need help quitting, ask your doctor.  Drink enough water to keep your pee (urine) clear or pale yellow. A shot (vaccine) can help prevent pneumonia. Shots are often suggested for:  People older than 66 years of age.  People older than 66 years of age:  Who are having cancer treatment.  Who have long-term (chronic) lung disease.  Who have problems with their body's defense system (immune system). You may also prevent pneumonia if you take these actions:  Get the flu (influenza) shot every year.  Go to the dentist as often as told.  Wash your hands often. If soap and water are not available, use hand sanitizer. GET HELP IF:  You have a fever.  You lose sleep because your cough medicine does not help. GET HELP RIGHT AWAY IF:  You are short of breath and it gets worse.  You have more chest pain.  Your sickness gets worse. This is very serious if:  You are an older adult.  Your  body's defense system is weak.  You cough up blood.   This information is not intended to replace advice given to you by your health care provider. Make sure you discuss any questions you have with your health care provider.   Document Released: 12/25/2007 Document Revised: 03/29/2015 Document Reviewed: 11/02/2014 Elsevier Interactive Patient Education 2016 [place] may have a very early pneumonia. I will give you some Augmentin one twice a day remember to take that with food. Sometimes can cause diarrhea she don't take it with food. Please return if you feel worse or sicker or not any better in 2 or 3 days. Also follow-up with your doctor.

## 2015-08-25 NOTE — ED Notes (Signed)
Patient states she is feeling "flu like" x 2 weeks.  Patient reports decrease appetite.  States having lung and sinus congestion.  Coughing productive cough of green sputum.

## 2015-09-22 ENCOUNTER — Encounter: Payer: Self-pay | Admitting: Family Medicine

## 2015-10-18 ENCOUNTER — Other Ambulatory Visit: Payer: Self-pay | Admitting: *Deleted

## 2015-10-18 ENCOUNTER — Other Ambulatory Visit: Payer: Self-pay | Admitting: Family Medicine

## 2015-10-18 DIAGNOSIS — F410 Panic disorder [episodic paroxysmal anxiety] without agoraphobia: Secondary | ICD-10-CM

## 2015-10-18 DIAGNOSIS — G894 Chronic pain syndrome: Secondary | ICD-10-CM

## 2015-10-18 MED ORDER — ALPRAZOLAM 0.25 MG PO TABS: 0.2500 mg | ORAL_TABLET | Freq: Two times a day (BID) | ORAL | Status: DC | PRN

## 2015-10-18 MED ORDER — MORPHINE SULFATE 15 MG PO TABS: 7.5000 mg | ORAL_TABLET | ORAL | Status: DC | PRN

## 2015-10-18 NOTE — Telephone Encounter (Signed)
Please call in alprazolam.  

## 2015-10-18 NOTE — Telephone Encounter (Signed)
Rx called in to pharmacy. 

## 2015-10-18 NOTE — Telephone Encounter (Signed)
Pt needs refill   morphine (MSIR) 15 MG tablet ALPRAZolam (XANAX) 0.25 MG tablet  She wants to know if you can increase the xanax.  She is having more panic anxiety.   Please call when ready to be picked up.  Thanks Con Memos

## 2015-10-19 ENCOUNTER — Telehealth: Payer: Self-pay

## 2015-10-19 DIAGNOSIS — F410 Panic disorder [episodic paroxysmal anxiety] without agoraphobia: Secondary | ICD-10-CM

## 2015-10-19 MED ORDER — ALPRAZOLAM 0.25 MG PO TABS
0.2500 mg | ORAL_TABLET | Freq: Two times a day (BID) | ORAL | Status: DC | PRN
Start: 2015-10-19 — End: 2015-10-27

## 2015-10-19 NOTE — Telephone Encounter (Signed)
Pt currently taking alprazolam 0.5 mg BID. States the # 30 tablets are only enough to last her 15 days. Is asking if she could have # 60 tablets. Please advise. Thanks. Renaldo Fiddler, CMA

## 2015-10-19 NOTE — Telephone Encounter (Signed)
We have only prescribed her 0.25 and that is what her pharmacy has been dispensing

## 2015-10-19 NOTE — Telephone Encounter (Signed)
Prescription phoned into the pharmacy as stated below. I called patient and advised her that prescription has been sent into pharmacy. Patient mentioned that the dosage was supposed to be Alprazolam 0.5mg . The approved prescription below states 0.25mg . Did we change the dose?

## 2015-10-19 NOTE — Telephone Encounter (Signed)
Please call in alprazolam.  

## 2015-10-27 ENCOUNTER — Other Ambulatory Visit: Payer: Self-pay | Admitting: Family Medicine

## 2015-10-27 ENCOUNTER — Encounter: Payer: Self-pay | Admitting: Family Medicine

## 2015-10-27 DIAGNOSIS — F410 Panic disorder [episodic paroxysmal anxiety] without agoraphobia: Secondary | ICD-10-CM

## 2015-10-27 MED ORDER — ALPRAZOLAM 0.5 MG PO TABS: 0.2500 mg | ORAL_TABLET | Freq: Two times a day (BID) | ORAL | Status: DC | PRN

## 2015-10-27 NOTE — Progress Notes (Signed)
Patient's husband was seen today and states his wife feels alprazolam is not strong enough and was wondering if dose can be increased. She is only taking 0.25mg  twice a day. New rx printed for 0.5mg  twice a day.

## 2015-12-08 ENCOUNTER — Other Ambulatory Visit: Payer: Self-pay | Admitting: *Deleted

## 2015-12-08 DIAGNOSIS — G894 Chronic pain syndrome: Secondary | ICD-10-CM

## 2015-12-08 MED ORDER — MORPHINE SULFATE 15 MG PO TABS
7.5000 mg | ORAL_TABLET | ORAL | Status: DC | PRN
Start: 2015-12-08 — End: 2016-02-15

## 2016-02-15 ENCOUNTER — Other Ambulatory Visit: Payer: Self-pay

## 2016-02-15 DIAGNOSIS — J9611 Chronic respiratory failure with hypoxia: Secondary | ICD-10-CM

## 2016-02-15 DIAGNOSIS — G894 Chronic pain syndrome: Secondary | ICD-10-CM

## 2016-02-15 NOTE — Telephone Encounter (Signed)
Patient request refill. Patient states she has enough medication to last until Dr. Caryn Section returns on 8/021/2017. Please call patient when ready for pick up

## 2016-02-20 MED ORDER — MORPHINE SULFATE 15 MG PO TABS: 7.5000 mg | ORAL_TABLET | ORAL | 0 refills | Status: DC | PRN

## 2016-02-20 MED ORDER — ALBUTEROL SULFATE HFA 108 (90 BASE) MCG/ACT IN AERS: 2.0000 | INHALATION_SPRAY | Freq: Four times a day (QID) | RESPIRATORY_TRACT | 3 refills | Status: DC | PRN

## 2016-02-23 IMAGING — CT CT ANGIO CHEST
1 of 2 series · 18 of 30 positions shown · IV contrast (APPLIED)
Comparison: 11/11/2014 and prior CTs. 08/25/2015 and prior chest
radiographs.

CLINICAL DATA: 64-year-old female with shortness of breath and
cough for 2 weeks. History of cervical cancer.

EXAM:
CT ANGIOGRAPHY CHEST WITH CONTRAST
TECHNIQUE: Multidetector CT imaging of the chest was performed using the
standard protocol during bolus administration of intravenous
contrast. Multiplanar CT image reconstructions and MIPs were
obtained to evaluate the vascular anatomy.
CONTRAST:  75mL OMNIPAQUE IOHEXOL 350 MG/ML SOLN

[Series 5: pe 1.0 thins · axial · 0.63mm/px · z∈[-428,-142]mm · 18 of 321 slices shown]
[im 18/321  lung]
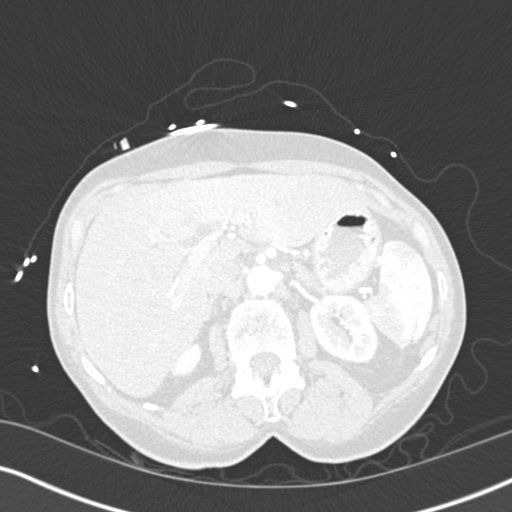
[im 36/321  mediastinal]
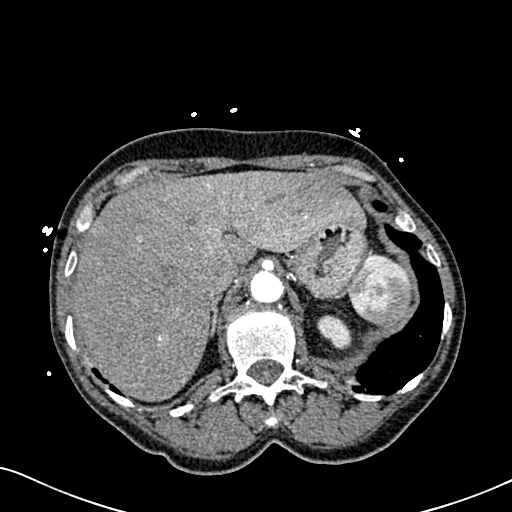
[im 54/321  lung]
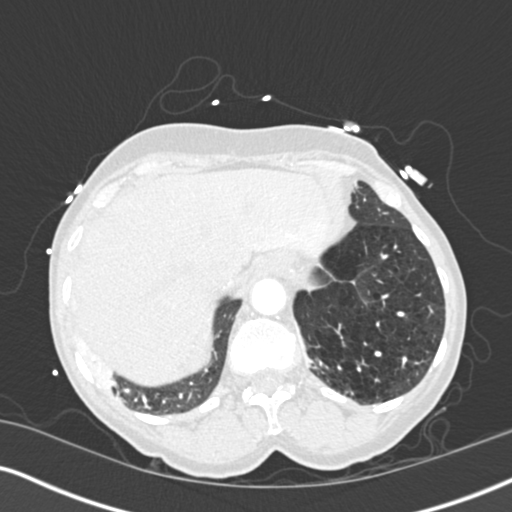
[im 72/321  mediastinal]
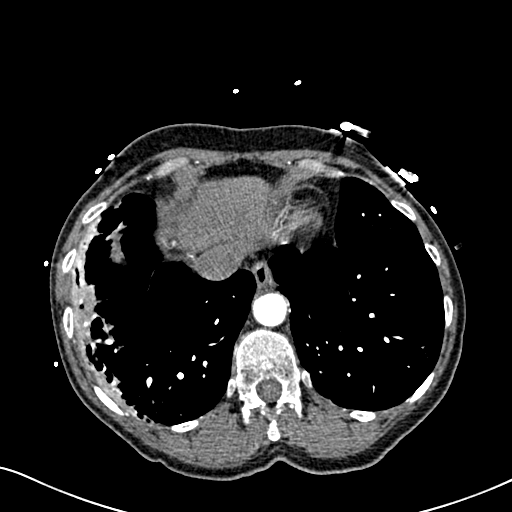
[im 89/321  lung]
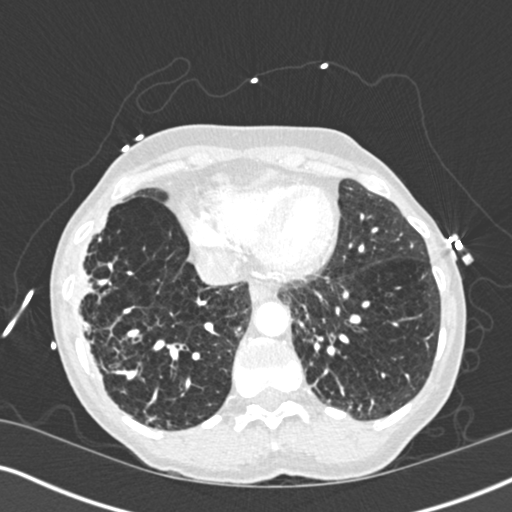
[im 107/321  mediastinal]
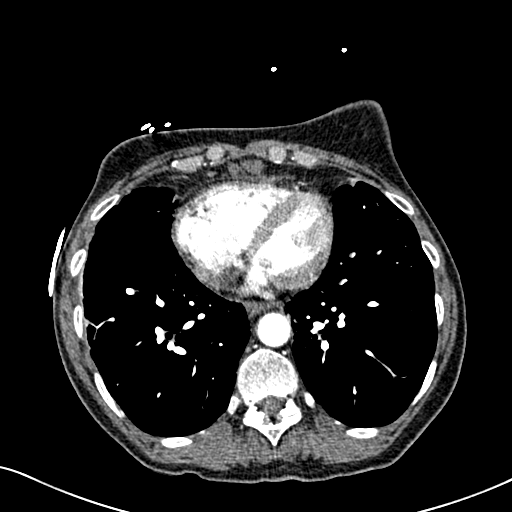
[im 125/321  lung]
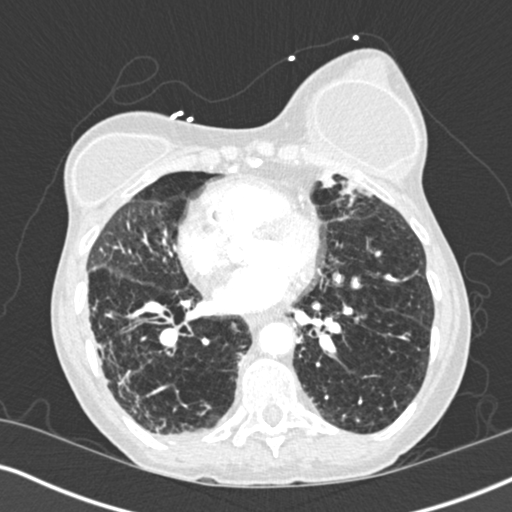
[im 143/321  mediastinal]
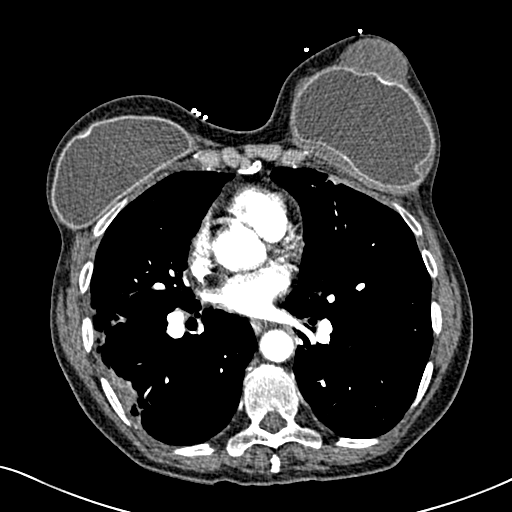
[im 151/321  lung]
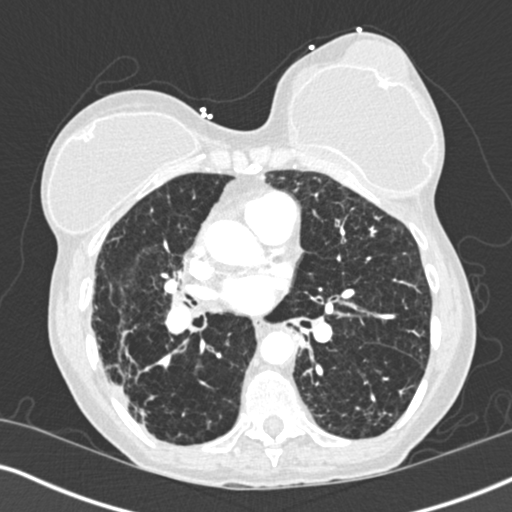
[im 161/321  mediastinal]
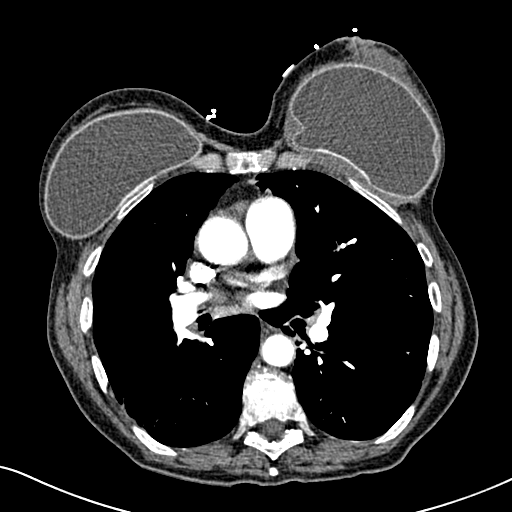
[im 178/321  lung]
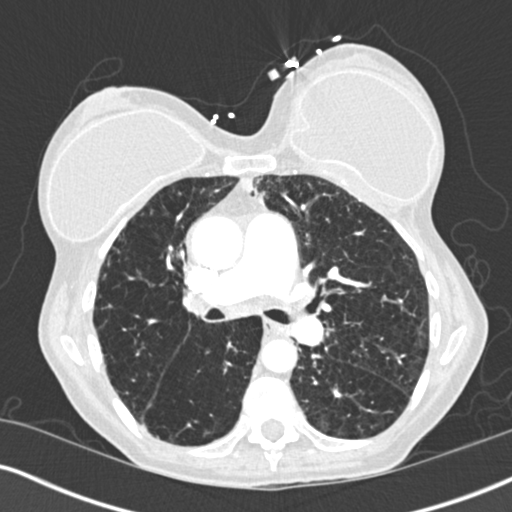
[im 196/321  mediastinal]
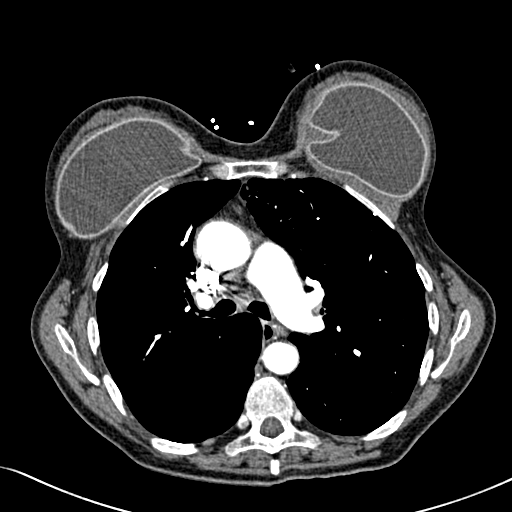
[im 214/321  lung]
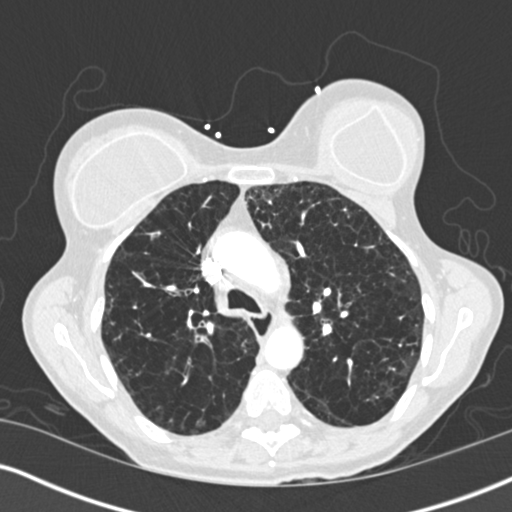
[im 232/321  mediastinal]
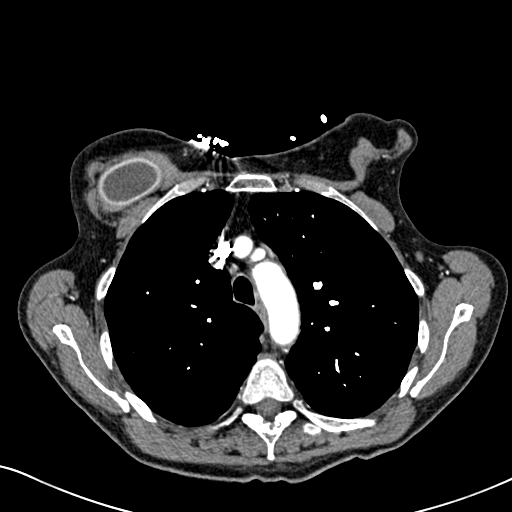
[im 249/321  lung]
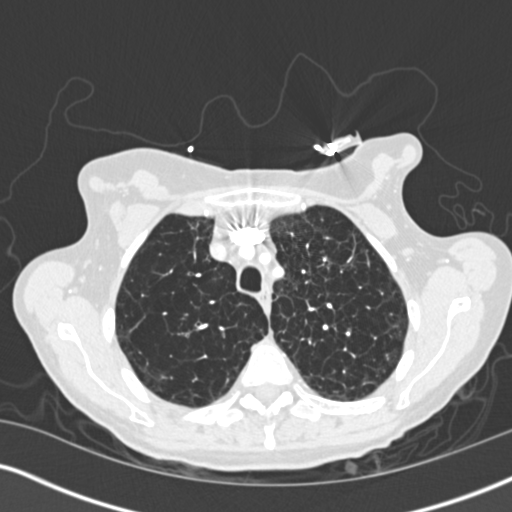
[im 267/321  mediastinal]
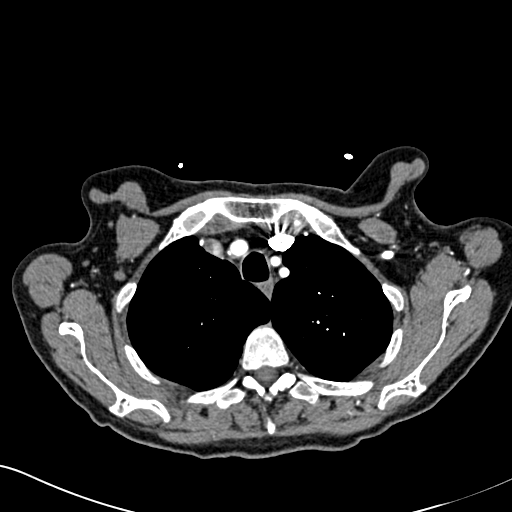
[im 285/321  lung]
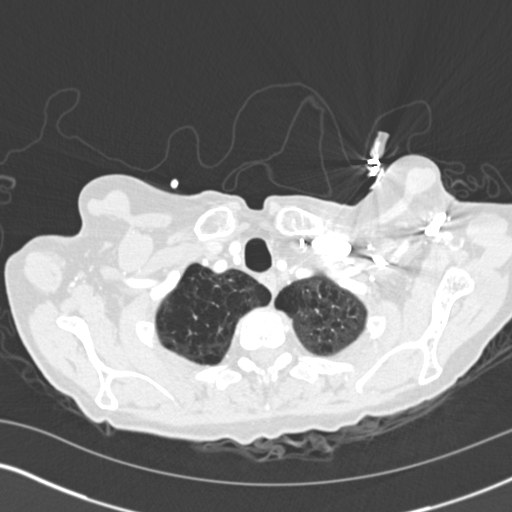
[im 303/321  mediastinal]
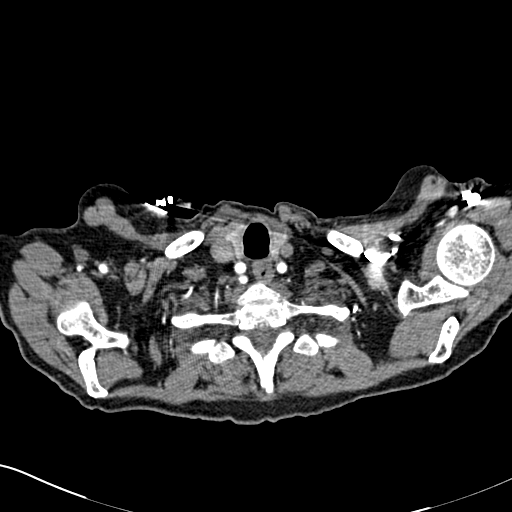

[18 of 30 positions shown; findings below may reference images not displayed]

FINDINGS: This is a technically satisfactory study.

Mediastinum/Nodes: No pulmonary emboli are identified. There is no
evidence of thoracic aortic aneurysm or dissection. No enlarged
lymph nodes or pericardial effusion identified.

Bilateral breast prosthesis are identified. There is soft tissue
versus fluid prominence anterior to the prosthesis in the lower left
breast.

Lungs/Pleura: Peripheral confluent opacity along the lateral mid and
lower right lung has decreased from the prior study. New mild
atelectasis within the lingula is identified.

Severe emphysema and scattered areas of atelectasis/scarring noted.
There is no evidence of pulmonary mass, pleural effusion or
pneumothorax.

Upper abdomen: No acute abnormalities

Musculoskeletal: No acute or suspicious abnormalities. Lower
cervical surgical changes identified.

Review of the MIP images confirms the above findings.
IMPRESSION: No evidence of pulmonary emboli or thoracic aortic aneurysm/
dissection.

New mild lingular atelectasis. Decrease peripheral confluent
opacity/atelectasis within the lateral mid and lower right lung.

Soft tissue versus fluid prominence anterior to the left breast
prosthesis in the lower left breast. Clinical correlation
recommended. Elective mammogram/breast ultrasound recommended as
clinically indicated.

Severe emphysema.

## 2016-02-26 NOTE — Telephone Encounter (Signed)
error 

## 2016-03-06 ENCOUNTER — Telehealth: Payer: Self-pay | Admitting: Family Medicine

## 2016-03-06 NOTE — Telephone Encounter (Signed)
Please advise. Jakiera Ehler Drozdowski, CMA  

## 2016-03-06 NOTE — Telephone Encounter (Signed)
Pt daughter, Raquel Sarna called stating pt received a summons for jury duty.  Pt daughter states due to pt being disable pt is not able to comply with jury duty.  Pt is requesting a letter stating this.  [phone]

## 2016-03-08 ENCOUNTER — Encounter: Payer: Self-pay | Admitting: *Deleted

## 2016-03-08 NOTE — Telephone Encounter (Signed)
Emily notified.

## 2016-03-15 ENCOUNTER — Other Ambulatory Visit: Payer: Self-pay | Admitting: Family Medicine

## 2016-04-17 ENCOUNTER — Inpatient Hospital Stay
Admission: EM | Admit: 2016-04-17 | Discharge: 2016-04-21 | DRG: 189 | Disposition: A | Discharge status: Home Health | Payer: BLUE CROSS/BLUE SHIELD | Attending: Internal Medicine | Admitting: Internal Medicine

## 2016-04-17 ENCOUNTER — Encounter: Payer: Self-pay | Admitting: Emergency Medicine

## 2016-04-17 ENCOUNTER — Emergency Department: Payer: BLUE CROSS/BLUE SHIELD

## 2016-04-17 DIAGNOSIS — R0600 Dyspnea, unspecified: Secondary | ICD-10-CM

## 2016-04-17 DIAGNOSIS — Z9981 Dependence on supplemental oxygen: Secondary | ICD-10-CM

## 2016-04-17 DIAGNOSIS — Z79899 Other long term (current) drug therapy: Secondary | ICD-10-CM

## 2016-04-17 DIAGNOSIS — J440 Chronic obstructive pulmonary disease with acute lower respiratory infection: Secondary | ICD-10-CM | POA: Diagnosis present

## 2016-04-17 DIAGNOSIS — E119 Type 2 diabetes mellitus without complications: Secondary | ICD-10-CM | POA: Diagnosis present

## 2016-04-17 DIAGNOSIS — R1110 Vomiting, unspecified: Secondary | ICD-10-CM

## 2016-04-17 DIAGNOSIS — R0602 Shortness of breath: Secondary | ICD-10-CM

## 2016-04-17 DIAGNOSIS — Z87891 Personal history of nicotine dependence: Secondary | ICD-10-CM | POA: Diagnosis not present

## 2016-04-17 DIAGNOSIS — K920 Hematemesis: Secondary | ICD-10-CM | POA: Diagnosis present

## 2016-04-17 DIAGNOSIS — J209 Acute bronchitis, unspecified: Secondary | ICD-10-CM | POA: Diagnosis present

## 2016-04-17 DIAGNOSIS — J441 Chronic obstructive pulmonary disease with (acute) exacerbation: Secondary | ICD-10-CM | POA: Diagnosis not present

## 2016-04-17 DIAGNOSIS — IMO0001 Reserved for inherently not codable concepts without codable children: Secondary | ICD-10-CM

## 2016-04-17 DIAGNOSIS — F332 Major depressive disorder, recurrent severe without psychotic features: Secondary | ICD-10-CM | POA: Diagnosis present

## 2016-04-17 DIAGNOSIS — R1310 Dysphagia, unspecified: Secondary | ICD-10-CM | POA: Diagnosis present

## 2016-04-17 DIAGNOSIS — J9622 Acute and chronic respiratory failure with hypercapnia: Secondary | ICD-10-CM | POA: Diagnosis present

## 2016-04-17 DIAGNOSIS — M6281 Muscle weakness (generalized): Secondary | ICD-10-CM

## 2016-04-17 DIAGNOSIS — J9602 Acute respiratory failure with hypercapnia: Secondary | ICD-10-CM

## 2016-04-17 DIAGNOSIS — J9621 Acute and chronic respiratory failure with hypoxia: Principal | ICD-10-CM | POA: Diagnosis present

## 2016-04-17 LAB — CBC WITH DIFFERENTIAL/PLATELET
BASOS ABS: 0 10*3/uL (ref 0–0.1)
Basophils Relative: 0 %
EOS PCT: 1 %
Eosinophils Absolute: 0.1 10*3/uL (ref 0–0.7)
HEMATOCRIT: 34.8 % — AB (ref 35.0–47.0)
HEMOGLOBIN: 11.1 g/dL — AB (ref 12.0–16.0)
LYMPHS ABS: 1.7 10*3/uL (ref 1.0–3.6)
LYMPHS PCT: 26 %
MCH: 28.7 pg (ref 26.0–34.0)
MCHC: 31.9 g/dL — AB (ref 32.0–36.0)
MCV: 89.9 fL (ref 80.0–100.0)
MONOS PCT: 9 %
Monocytes Absolute: 0.6 10*3/uL (ref 0.2–0.9)
Neutro Abs: 4.2 10*3/uL (ref 1.4–6.5)
Neutrophils Relative %: 64 %
Platelets: 172 10*3/uL (ref 150–440)
RBC: 3.87 MIL/uL (ref 3.80–5.20)
RDW: 13.3 % (ref 11.5–14.5)
WBC: 6.6 10*3/uL (ref 3.6–11.0)

## 2016-04-17 LAB — COMPREHENSIVE METABOLIC PANEL
ALBUMIN: 3.9 g/dL (ref 3.5–5.0)
ALT: 25 U/L (ref 14–54)
ANION GAP: 4 — AB (ref 5–15)
AST: 29 U/L (ref 15–41)
Alkaline Phosphatase: 91 U/L (ref 38–126)
BILIRUBIN TOTAL: 0.8 mg/dL (ref 0.3–1.2)
BUN: 22 mg/dL — AB (ref 6–20)
CO2: 44 mmol/L — AB (ref 22–32)
Calcium: 9.5 mg/dL (ref 8.9–10.3)
Chloride: 93 mmol/L — ABNORMAL LOW (ref 101–111)
Creatinine, Ser: 0.56 mg/dL (ref 0.44–1.00)
GFR calc Af Amer: >60 mL/min (ref >60)
GFR calc non Af Amer: >60 mL/min (ref >60)
GLUCOSE: 124 mg/dL — AB (ref 65–99)
POTASSIUM: 4.2 mmol/L (ref 3.5–5.1)
SODIUM: 141 mmol/L (ref 135–145)
TOTAL PROTEIN: 7.3 g/dL (ref 6.5–8.1)

## 2016-04-17 LAB — MAGNESIUM: Magnesium: 1.7 mg/dL (ref 1.7–2.4)

## 2016-04-17 LAB — TROPONIN I: Troponin I: <0.03 ng/mL (ref <0.03)

## 2016-04-17 MED ORDER — MAGNESIUM SULFATE 2 GM/50ML IV SOLN
2.0000 g | Freq: Once | INTRAVENOUS | Status: AC
  Administered 2016-04-17: 2 g via INTRAVENOUS
  Filled 2016-04-17: qty 50

## 2016-04-17 MED ORDER — HEPARIN SODIUM (PORCINE) 5000 UNIT/ML IJ SOLN
5000.0000 [IU] | Freq: Three times a day (TID) | INTRAMUSCULAR | Status: DC
  Administered 2016-04-17 – 2016-04-21 (×10): 5000 [IU] via SUBCUTANEOUS
  Filled 2016-04-17 (×10): qty 1

## 2016-04-17 MED ORDER — BUDESONIDE 180 MCG/ACT IN AEPB: 2.0000 | INHALATION_SPRAY | Freq: Two times a day (BID) | RESPIRATORY_TRACT | Status: DC

## 2016-04-17 MED ORDER — BUDESONIDE 0.5 MG/2ML IN SUSP: 0.5000 mg | Freq: Two times a day (BID) | RESPIRATORY_TRACT | Status: DC

## 2016-04-17 MED ORDER — IPRATROPIUM-ALBUTEROL 0.5-2.5 (3) MG/3ML IN SOLN
3.0000 mL | RESPIRATORY_TRACT | Status: DC
  Administered 2016-04-18 – 2016-04-20 (×12): 3 mL via RESPIRATORY_TRACT
  Filled 2016-04-17 (×12): qty 3

## 2016-04-17 MED ORDER — IPRATROPIUM-ALBUTEROL 0.5-2.5 (3) MG/3ML IN SOLN: 3.0000 mL | RESPIRATORY_TRACT | Status: DC | PRN

## 2016-04-17 MED ORDER — IPRATROPIUM-ALBUTEROL 0.5-2.5 (3) MG/3ML IN SOLN
3.0000 mL | Freq: Once | RESPIRATORY_TRACT | Status: AC
  Administered 2016-04-17: 3 mL via RESPIRATORY_TRACT
  Filled 2016-04-17: qty 3

## 2016-04-17 MED ORDER — ALBUTEROL SULFATE (2.5 MG/3ML) 0.083% IN NEBU: 5.0000 mg | INHALATION_SOLUTION | Freq: Once | RESPIRATORY_TRACT | Status: DC

## 2016-04-17 MED ORDER — TIOTROPIUM BROMIDE MONOHYDRATE 18 MCG IN CAPS
18.0000 ug | ORAL_CAPSULE | Freq: Every day | RESPIRATORY_TRACT | Status: DC
Start: 2016-04-18 — End: 2016-04-21
  Administered 2016-04-18 – 2016-04-21 (×4): 18 ug via RESPIRATORY_TRACT
  Filled 2016-04-17: qty 5

## 2016-04-17 MED ORDER — METHYLPREDNISOLONE SODIUM SUCC 125 MG IJ SOLR
125.0000 mg | Freq: Once | INTRAMUSCULAR | Status: AC
  Administered 2016-04-17: 125 mg via INTRAVENOUS
  Filled 2016-04-17: qty 2

## 2016-04-17 MED ORDER — SODIUM CHLORIDE 0.9 % IV SOLN
INTRAVENOUS | Status: DC
  Administered 2016-04-18 – 2016-04-20 (×3): via INTRAVENOUS

## 2016-04-17 MED ORDER — SODIUM CHLORIDE 0.9 % IV SOLN: 250.0000 mL | INTRAVENOUS | Status: DC | PRN

## 2016-04-17 NOTE — ED Provider Notes (Signed)
[facility] Emergency Department Provider Note    First MD Initiated Contact with Patient 04/17/16 2003     (approximate)  I have reviewed the triage vital signs and the nursing notes.   HISTORY  Chief Complaint Shortness of Breath    HPI Tiffany Mclean is a 65 y.o. 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 Patient arrives with family members who state that they've been trying to get her to seek medical care since 6 AM this morning. Patient has been having difficulty breathing much more than usual throughout the day. Denies any chest pain or pressure. Denies any nausea or vomiting. Denies any fevers or chills. No recent sick contacts. No diarrhea, dysuria.   Past Medical History:  Diagnosis Date  . Cancer (HCC)    squamous cell, vaginal  . COPD (chronic obstructive pulmonary disease) ([place])   . Diabetes mellitus without complication ([place])   . Pneumonia     Patient Active Problem List   Diagnosis Date Noted  . COPD exacerbation ([place]) 04/17/2016  . Tremor 06/13/2015  . Depression 05/15/2015  . Anxiety 02/13/2015  . Dermatitis, eczematoid 02/13/2015  . Hypokalemia 02/13/2015  . Lower extremity weakness 02/13/2015  . Leukocytosis 02/13/2015  . Magnesium deficiency 02/13/2015  . Symptomatic menopausal or female climacteric states 02/13/2015  . Panic disorder 02/13/2015  . Bed sore on buttock 02/08/2015  . 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 Chronic respiratory failure with hypoxia ([place]) 11/28/2014  . GERD (gastroesophageal reflux disease) 11/28/2014  . Anemia of chronic disease 11/28/2014  . Pneumonia 11/23/2014  . Generalized anxiety disorder 11/23/2014  . Injury of kidney 10/31/2014  . Personal history  of perinatal problems 10/24/2014  . Chronic pain associated with significant psychosocial dysfunction 02/21/2010  . Anemia, iron deficiency 01/29/2010  . Allergic rhinitis 12/01/2009  . DDD (degenerative disc disease), cervical 12/01/2009  . 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 Abdominal pain, generalized 03/21/2009  . Vitamin D deficiency 07/22/2008  . Hypercholesterolemia without hypertriglyceridemia 03/11/2007  . Essential and other specified forms of tremor 07/22/1998    Past Surgical History:  Procedure Laterality Date  . ABDOMINAL HYSTERECTOMY  1996   BSO. Dr. Ammie Dalton  . BREAST ENHANCEMENT SURGERY     rupture  . BREAST SURGERY  1983   Breast Augmentation  . [place]  . CHOLECYSTECTOMY  12/20/2010   Laparoscopic, Dr. Marina Gravel, [facility]  . EYE SURGERY     1977  . TUBAL LIGATION  1986    Prior to Admission medications   Medication Sig Start Date End Date Taking? Authorizing Provider  acetaminophen (RA ACETAMINOPHEN) 650 MG CR tablet Take 650 mg by mouth every 8 (eight) hours as needed for pain or fever.    Yes Historical Provider, MD  albuterol (ACCUNEB) 1.25 MG/3ML nebulizer solution Take 1 ampule by nebulization 4 (four) times daily as needed for shortness of breath.   Yes Historical Provider, MD  albuterol (PROVENTIL HFA;VENTOLIN HFA) 108 (90 Base) MCG/ACT inhaler Inhale 2 puffs into the lungs every 6 (six) hours as needed for wheezing or shortness of breath. 02/20/16  Yes Birdie Sons, MD  ALPRAZolam Duanne Moron) 0.25 MG tablet Take 0.25 mg by mouth 2 (two) times daily as needed.  Yes Historical Provider, MD  atorvastatin (LIPITOR) 40 MG tablet 1 (ONE) TABLET, ORAL, DAILY BY MOUTH 07/05/15  Yes Birdie Sons, MD  DULoxetine (CYMBALTA) 30 MG capsule Take 3 capsules (90 mg total) by mouth daily. 05/23/15  Yes Birdie Sons, MD  fluticasone (FLONASE) 50 MCG/ACT nasal spray Place 1 spray into both nostrils daily.   Yes Historical Provider, MD  ipratropium  (ATROVENT) 0.02 % nebulizer solution Take 0.5 mg by nebulization every 6 (six) hours as needed for wheezing or shortness of breath.   Yes Historical Provider, MD  morphine (MSIR) 15 MG tablet Take 0.5 tablets (7.5 mg total) by mouth every 4 (four) hours as needed for moderate pain or severe pain ((4-6/10)). Patient taking differently: Take 7.5 mg by mouth every 4 (four) hours as needed for moderate pain or severe pain.  02/20/16  Yes Birdie Sons, MD  RABEprazole (ACIPHEX) 20 MG tablet Take 1 tablet (20 mg total) by mouth daily. 05/23/15  Yes Birdie Sons, MD  tiotropium (SPIRIVA HANDIHALER) 18 MCG inhalation capsule Place 1 capsule (18 mcg total) into inhaler and inhale daily. 02/13/15  Yes Birdie Sons, MD  acyclovir ointment (ZOVIRAX) 5 % Apply 1 application topically every 3 (three) hours. Patient not taking: Reported on 04/17/2016 05/02/15   Birdie Sons, MD  clindamycin (CLEOCIN) 300 MG capsule Take 1 capsule (300 mg total) by mouth 3 (three) times daily. Patient not taking: Reported on 04/17/2016 06/14/15   Birdie Sons, MD  [employer] ALLERGY RELIEF-D 5-120 MG per tablet TAKE 1 TABLET BY MOUTH 3 TIMES A DAY AS NEEDED Patient not taking: Reported on 04/17/2016 02/16/15   Birdie Sons, MD  mupirocin cream (BACTROBAN) 2 % Apply 1 application topically 2 (two) times daily. Patient not taking: Reported on 04/17/2016 06/12/15   Birdie Sons, MD  primidone (MYSOLINE) 50 MG tablet 1/2 tablet at bedtime for 6 days, then increase to 1 tablet at bedtime for 6 days, then increase to 2 tablets at bedtime Patient not taking: Reported on 04/17/2016 07/12/15   Birdie Sons, MD  triamcinolone ointment (KENALOG) 0.5 % Apply 1 application topically 2 (two) times daily. Patient not taking: Reported on 04/17/2016 05/02/15   Birdie Sons, MD    Allergies Asa [aspirin]; Avelox [moxifloxacin]; Cephalexin; Codeine; Erythromycin ethylsuccinate; Flagyl [metronidazole]; Latex; Levaquin [levofloxacin];  Meloxicam; Shellfish allergy; Sulfa antibiotics; Topiramate; Doxycycline; and Tetracyclines & related  Family History  Problem Relation Age of Onset  . Gout Mother   . Hypertension Mother   . Arthritis Mother   . Stroke Mother   . Kidney disease Mother   . CAD Mother   . Emphysema Father   . Gout Father   . Hypertension Father   . Kidney disease Father   . Arthritis Father   . Ulcers Father     Social History Social History  Substance Use Topics  . Smoking status: Former Smoker    Packs/day: 1.50    Years: 30.00    Types: Cigarettes    Quit date: 11/21/2008  . Smokeless tobacco: Former Systems developer    Quit date: 07/22/2008  . Alcohol use No    Review of Systems Patient denies headaches, rhinorrhea, blurry vision, numbness, shortness of breath, chest pain, edema, cough, abdominal pain, nausea, vomiting, diarrhea, dysuria, fevers, rashes or hallucinations unless otherwise stated above in HPI. ____________________________________________   PHYSICAL EXAM:  VITAL SIGNS: Vitals:   04/17/16 2300 04/18/16 0034  BP: 118/90 137/79  Pulse:  87  Resp: (!) 25 16  Temp:  97.7 F (36.5 C)    Constitutional: Alert and oriented. Chronically ill appearing female presents in acute respiratory distress Eyes: Conjunctivae are normal. PERRL. EOMI. Head: Atraumatic. Nose: No congestion/rhinnorhea. Mouth/Throat: Mucous membranes are moist.  Oropharynx non-erythematous. Neck: No stridor. Painless ROM. No cervical spine tenderness to palpation Hematological/Lymphatic/Immunilogical: No cervical lymphadenopathy. Cardiovascular: Normal rate, regular rhythm. Grossly normal heart sounds.  Good peripheral circulation. Respiratory: Markedly to Prolonged expiratory phase and very diminished breath sounds throughout all lung fields.. Gastrointestinal: Soft and nontender. No distention. No abdominal bruits. No CVA tenderness.  Musculoskeletal: No lower extremity tenderness nor edema.  No joint  effusions. Neurologic:  Normal speech and language. No gross focal neurologic deficits are appreciated. No gait instability. Skin:  Skin is warm, dry and intact. No rash noted. Psychiatric: Mood and affect are normal. Speech and behavior are normal.  ____________________________________________   LABS (all labs ordered are listed, but only abnormal results are displayed)  Results for orders placed or performed during the hospital encounter of 04/17/16 (from the past 24 hour(s))  CBC with Differential/Platelet     Status: Abnormal   Collection Time: 04/17/16  8:22 PM  Result Value Ref Range   WBC 6.6 3.6 - 11.0 K/uL   RBC 3.87 3.80 - 5.20 MIL/uL   Hemoglobin 11.1 (L) 12.0 - 16.0 g/dL   HCT 34.8 (L) 35.0 - 47.0 %   MCV 89.9 80.0 - 100.0 fL   MCH 28.7 26.0 - 34.0 pg   MCHC 31.9 (L) 32.0 - 36.0 g/dL   RDW 13.3 11.5 - 14.5 %   Platelets 172 150 - 440 K/uL   Neutrophils Relative % 64 %   Neutro Abs 4.2 1.4 - 6.5 K/uL   Lymphocytes Relative 26 %   Lymphs Abs 1.7 1.0 - 3.6 K/uL   Monocytes Relative 9 %   Monocytes Absolute 0.6 0.2 - 0.9 K/uL   Eosinophils Relative 1 %   Eosinophils Absolute 0.1 0 - 0.7 K/uL   Basophils Relative 0 %   Basophils Absolute 0.0 0 - 0.1 K/uL  Comprehensive metabolic panel     Status: Abnormal   Collection Time: 04/17/16  8:22 PM  Result Value Ref Range   Sodium 141 135 - 145 mmol/L   Potassium 4.2 3.5 - 5.1 mmol/L   Chloride 93 (L) 101 - 111 mmol/L   CO2 44 (H) 22 - 32 mmol/L   Glucose, Bld 124 (H) 65 - 99 mg/dL   BUN 22 (H) 6 - 20 mg/dL   Creatinine, Ser 0.56 0.44 - 1.00 mg/dL   Calcium 9.5 8.9 - 10.3 mg/dL   Total Protein 7.3 6.5 - 8.1 g/dL   Albumin 3.9 3.5 - 5.0 g/dL   AST 29 15 - 41 U/L   ALT 25 14 - 54 U/L   Alkaline Phosphatase 91 38 - 126 U/L   Total Bilirubin 0.8 0.3 - 1.2 mg/dL   GFR calc non Af Amer >60 >60 mL/min   GFR calc Af Amer >60 >60 mL/min   Anion gap 4 (L) 5 - 15  Magnesium     Status: None   Collection Time: 04/17/16  8:22  PM  Result Value Ref Range   Magnesium 1.7 1.7 - 2.4 mg/dL  Troponin I     Status: None   Collection Time: 04/17/16  8:22 PM  Result Value Ref Range   Troponin I <0.03 <0.03 ng/mL  Blood gas, venous  Status: Abnormal (Preliminary result)   Collection Time: 04/17/16  8:22 PM  Result Value Ref Range   FIO2 0.30    Delivery systems BILEVEL POSITIVE AIRWAY PRESSURE    pH, Ven 7.32 7.250 - 7.430   pCO2, Ven 104 (HH) 44.0 - 60.0 mmHg   pO2, Ven 32.0 32.0 - 45.0 mmHg   Bicarbonate 53.6 (H) 20.0 - 28.0 mmol/L   Acid-Base Excess 23.0 (H) 0.0 - 2.0 mmol/L   O2 Saturation 55.8 %   Patient temperature 37.0    Collection site PENDING    Sample type ARTERIAL DRAW   CBC WITH DIFFERENTIAL     Status: Abnormal   Collection Time: 04/18/16 12:11 AM  Result Value Ref Range   WBC 7.3 3.6 - 11.0 K/uL   RBC 3.72 (L) 3.80 - 5.20 MIL/uL   Hemoglobin 10.8 (L) 12.0 - 16.0 g/dL   HCT 33.4 (L) 35.0 - 47.0 %   MCV 89.8 80.0 - 100.0 fL   MCH 29.0 26.0 - 34.0 pg   MCHC 32.3 32.0 - 36.0 g/dL   RDW 13.5 11.5 - 14.5 %   Platelets 172 150 - 440 K/uL   Neutrophils Relative % 89 %   Neutro Abs 6.4 1.4 - 6.5 K/uL   Lymphocytes Relative 10 %   Lymphs Abs 0.8 (L) 1.0 - 3.6 K/uL   Monocytes Relative 1 %   Monocytes Absolute 0.1 (L) 0.2 - 0.9 K/uL   Eosinophils Relative 0 %   Eosinophils Absolute 0.0 0 - 0.7 K/uL   Basophils Relative 0 %   Basophils Absolute 0.0 0 - 0.1 K/uL  Comprehensive metabolic panel     Status: Abnormal   Collection Time: 04/18/16 12:11 AM  Result Value Ref Range   Sodium 140 135 - 145 mmol/L   Potassium 4.1 3.5 - 5.1 mmol/L   Chloride 92 (L) 101 - 111 mmol/L   CO2 44 (H) 22 - 32 mmol/L   Glucose, Bld 125 (H) 65 - 99 mg/dL   BUN 23 (H) 6 - 20 mg/dL   Creatinine, Ser 0.53 0.44 - 1.00 mg/dL   Calcium 9.3 8.9 - 10.3 mg/dL   Total Protein 7.2 6.5 - 8.1 g/dL   Albumin 3.9 3.5 - 5.0 g/dL   AST 29 15 - 41 U/L   ALT 24 14 - 54 U/L   Alkaline Phosphatase 90 38 - 126 U/L   Total  Bilirubin 0.9 0.3 - 1.2 mg/dL   GFR calc non Af Amer >60 >60 mL/min   GFR calc Af Amer >60 >60 mL/min   Anion gap 4 (L) 5 - 15  Magnesium     Status: Abnormal   Collection Time: 04/18/16 12:11 AM  Result Value Ref Range   Magnesium 3.4 (H) 1.7 - 2.4 mg/dL  Phosphorus     Status: None   Collection Time: 04/18/16 12:11 AM  Result Value Ref Range   Phosphorus 2.8 2.5 - 4.6 mg/dL  Glucose, capillary     Status: Abnormal   Collection Time: 04/18/16 12:30 AM  Result Value Ref Range   Glucose-Capillary 136 (H) 65 - 99 mg/dL   ____________________________________________  EKG My review and personal interpretation at Time: 21:05   Indication: sob  Rate: 70  Rhythm: nsr Axis: normal Other: no acute ST changes ____________________________________________  RADIOLOGY  See chart ____________________________________________   PROCEDURES  Procedure(s) performed: none    Critical Care performed: yes CRITICAL CARE Performed by: Merlyn Lot   Total critical care time: 74  minutes  Critical care time was exclusive of separately billable procedures and treating other patients.  Critical care was necessary to treat or prevent imminent or life-threatening deterioration.  Critical care was time spent personally by me on the following activities: development of treatment plan with patient and/or surrogate as well as nursing, discussions with consultants, evaluation of patient's response to treatment, examination of patient, obtaining history from patient or surrogate, ordering and performing treatments and interventions, ordering and review of laboratory studies, ordering and review of radiographic studies, pulse oximetry and re-evaluation of patient's condition.  ____________________________________________   INITIAL IMPRESSION / ASSESSMENT AND PLAN / ED COURSE  Pertinent labs & imaging results that were available during my care of the patient were reviewed by me and considered in  my medical decision making (see chart for details).  DDX: Asthma, copd, CHF, pna, ptx, malignancy, Pe, anemia   CHANIELLE MEURER is a 65 y.o. who presents to the ED with acute on chronic respiratory failure with underlying COPD. Patient arrives in moderate respiratory distress. Placed on BiPAP due to diminished breath sounds and improving with nebulizer treatments. Patient started on IV Solu-Medrol and IV magnesium. Patient was stabilized on BiPAP. Exam not consistent with congestive heart failure. Denies any chest pain. Not consistent with ACS. VBG does show a hypercapnic respiratory failure with incomplete compensation. Do not appreciate any evidence of pneumonia on chest x-ray. Patient was observed and managed in the ER for 1 hour for stabilization. Patient admitted to ICU for continued monitoring and management of BiPAP.  Have discussed with the patient and available family all diagnostics and treatments performed thus far and all questions were answered to the best of my ability. The patient demonstrates understanding and agreement with plan.   Clinical Course     ____________________________________________   FINAL CLINICAL IMPRESSION(S) / ED DIAGNOSES  Final diagnoses:  Acute hypercapnic respiratory failure ([place])      NEW MEDICATIONS STARTED DURING THIS VISIT:  Current Discharge Medication List       Note:  This document was prepared using Dragon voice recognition software and may include unintentional dictation errors.    Merlyn Lot, MD 04/18/16 [phone]

## 2016-04-17 NOTE — ED Triage Notes (Addendum)
PT to rm 15 from triage after VS taken, 82% on 2L.  Reports SOB, shaking, not feeling right.  Reports extensive history, including being on life support, in and out of rehab.  Pt NAD at this time.

## 2016-04-17 NOTE — ED Notes (Signed)
Pt. States her pulmonologist is Dr. Vella Kohler @ [facility]

## 2016-04-17 NOTE — ED Notes (Signed)
Pt. States breathing difficulty that started last night.  Pt. States increased difficulty throughout day.  Pt. States she is always on 2L/3L at home.  Pt. States she also struggles with anxiety.

## 2016-04-17 NOTE — H&P (Signed)
PULMONARY / CRITICAL CARE MEDICINE   Name: Tiffany Mclean MRN: [id number] DOB: 02-25-1951    ADMISSION DATE:  04/17/2016 CONSULTATION DATE:  04/17/16  REFERRING MD:  Dr. Merlyn Lot  CHIEF COMPLAINT:  Shortness of breath  HISTORY OF PRESENT ILLNESS:    Tiffany Mclean is a 65 year old female with known Hx of COPD well over 9 years. She is a  Former smoker and quit almost 9 years ago. She uses 2 Lof O2 at home.  Patient is followed by Dr Renee Harder at [facility].  Patient presented to ED on 9/27 with complaints of shortness of breath, her O2 sats were down to 82% on 2l of oxygen.  CXR was concerning for hyperinflation of lung.  She denies any fever, chills , nausea , vomiting and chest pain.  Patient was put on BiPAP and PCCM team was called to admit the patient.  PAST MEDICAL HISTORY :  She  has a past medical history of Cancer ([place]); COPD (chronic obstructive pulmonary disease) ([place]); Diabetes mellitus without complication ([place]); and Pneumonia.  PAST SURGICAL HISTORY: She  has a past surgical history that includes Cholecystectomy (12/20/2010); Abdominal hysterectomy (1996); Tubal ligation (1986); Breast surgery (1983); Eye surgery; Cervical spine surgery (1996, 1993); and Breast enhancement surgery.  Allergies  Allergen Reactions  . Asa [Aspirin]   . Avelox [Moxifloxacin]   . Cephalexin   . Codeine   . Erythromycin Ethylsuccinate   . Flagyl [Metronidazole]   . Latex   . Levaquin [Levofloxacin] Hives  . Meloxicam   . Shellfish Allergy Nausea And Vomiting and Other (See Comments)    Dizzy, fainting  . Sulfa Antibiotics   . Topiramate   . Doxycycline Rash  . Tetracyclines & Related Rash    No current facility-administered medications on file prior to encounter.    Current Outpatient Prescriptions on File Prior to Encounter  Medication Sig  . acetaminophen (RA ACETAMINOPHEN) 650 MG CR tablet Take 650 mg by mouth every 8 (eight) hours as needed for pain or  fever.   Marland Kitchen albuterol (ACCUNEB) 1.25 MG/3ML nebulizer solution Take 1 ampule by nebulization 4 (four) times daily as needed for shortness of breath.  Marland Kitchen albuterol (PROVENTIL HFA;VENTOLIN HFA) 108 (90 Base) MCG/ACT inhaler Inhale 2 puffs into the lungs every 6 (six) hours as needed for wheezing or shortness of breath.  Marland Kitchen atorvastatin (LIPITOR) 40 MG tablet 1 (ONE) TABLET, ORAL, DAILY BY MOUTH  . DULoxetine (CYMBALTA) 30 MG capsule Take 3 capsules (90 mg total) by mouth daily.  . fluticasone (FLONASE) 50 MCG/ACT nasal spray Place 1 spray into both nostrils daily.  Marland Kitchen ipratropium (ATROVENT) 0.02 % nebulizer solution Take 0.5 mg by nebulization every 6 (six) hours as needed for wheezing or shortness of breath.  . morphine (MSIR) 15 MG tablet Take 0.5 tablets (7.5 mg total) by mouth every 4 (four) hours as needed for moderate pain or severe pain ((4-6/10)). (Patient taking differently: Take 7.5 mg by mouth every 4 (four) hours as needed for moderate pain or severe pain. )  . RABEprazole (ACIPHEX) 20 MG tablet Take 1 tablet (20 mg total) by mouth daily.  Marland Kitchen tiotropium (SPIRIVA HANDIHALER) 18 MCG inhalation capsule Place 1 capsule (18 mcg total) into inhaler and inhale daily.  Marland Kitchen acyclovir ointment (ZOVIRAX) 5 % Apply 1 application topically every 3 (three) hours. (Patient not taking: Reported on 04/17/2016)  . clindamycin (CLEOCIN) 300 MG capsule Take 1 capsule (300 mg total) by mouth 3 (three) times daily. (Patient  not taking: Reported on 04/17/2016)  . [employer] ALLERGY RELIEF-D 5-120 MG per tablet TAKE 1 TABLET BY MOUTH 3 TIMES A DAY AS NEEDED (Patient not taking: Reported on 04/17/2016)  . mupirocin cream (BACTROBAN) 2 % Apply 1 application topically 2 (two) times daily. (Patient not taking: Reported on 04/17/2016)  . primidone (MYSOLINE) 50 MG tablet 1/2 tablet at bedtime for 6 days, then increase to 1 tablet at bedtime for 6 days, then increase to 2 tablets at bedtime (Patient not taking: Reported on 04/17/2016)  .  triamcinolone ointment (KENALOG) 0.5 % Apply 1 application topically 2 (two) times daily. (Patient not taking: Reported on 04/17/2016)    FAMILY HISTORY:  Her indicated that the status of her mother is unknown. She indicated that the status of her father is unknown.    SOCIAL HISTORY: She  reports that she quit smoking about 7 years ago. Her smoking use included Cigarettes. She has a 45.00 pack-year smoking history. She quit smokeless tobacco use about 7 years ago. She reports that she does not drink alcohol or use drugs.  REVIEW OF SYSTEMS:   Review of Systems  Constitutional: Negative for chills, fever and malaise/fatigue.  Eyes: Negative for double vision, photophobia and discharge.  Respiratory: Positive for shortness of breath. Negative for cough, sputum production and wheezing.   Cardiovascular: Negative for orthopnea and claudication.  Gastrointestinal: Negative for abdominal pain, constipation, diarrhea, nausea and vomiting.  Genitourinary: Negative for frequency and urgency.  Musculoskeletal: Negative for back pain and neck pain.  Neurological: Negative for sensory change, speech change, focal weakness and seizures.  Psychiatric/Behavioral: Negative for hallucinations, memory loss and substance abuse. The patient is not nervous/anxious and does not have insomnia.      SUBJECTIVE:  Patient was more worried about her breast implants, she states that her breathing is much better now but she has pain on her right side of the breast.   VITAL SIGNS: BP 97/77   Pulse 76   Temp 98.6 F (37 C) (Oral)   Resp (!) 24   Ht 5\' 5"  (1.651 m)   Wt 52.2 kg (115 lb)   SpO2 100%   BMI 19.14 kg/m   HEMODYNAMICS:    VENTILATOR SETTINGS: FiO2 (%):  [30 %] 30 %  INTAKE / OUTPUT: No intake/output data recorded.  PHYSICAL EXAMINATION: General:  White female on BiPAP, in no acute distress  Neuro:  Awake, alert, oriented, follows command, no focal deficits HEENT:  Atraumatic,  normocephalic, no discharge, no JVD appreciated Cardiovascular:  S1S2, regular, no MRG noted Lungs: diminished bilaterally, no wheezes, crackles, rhonchi noted Abdomen: soft, nontender, active bowel sounds Musculoskeletal:  No inflammation/deformity noted Skin:  Grossly  intact  LABS:  BMET  Recent Labs Lab 04/17/16 2022  NA 141  K 4.2  CL 93*  CO2 44*  BUN 22*  CREATININE 0.56  GLUCOSE 124*    Electrolytes  Recent Labs Lab 04/17/16 2022  CALCIUM 9.5  MG 1.7    CBC  Recent Labs Lab 04/17/16 2022  WBC 6.6  HGB 11.1*  HCT 34.8*  PLT 172    Coag's No results for input(s): APTT, INR in the last 168 hours.  Sepsis Markers No results for input(s): LATICACIDVEN, PROCALCITON, O2SATVEN in the last 168 hours.  ABG No results for input(s): PHART, PCO2ART, PO2ART in the last 168 hours.  Liver Enzymes  Recent Labs Lab 04/17/16 2022  AST 29  ALT 25  ALKPHOS 91  BILITOT 0.8  ALBUMIN 3.9  Cardiac Enzymes  Recent Labs Lab 04/17/16 2022  TROPONINI <0.03    Glucose No results for input(s): GLUCAP in the last 168 hours.  Imaging Dg Chest 1 View  Result Date: 04/17/2016 CLINICAL DATA:  Shortness of breath.  COPD EXAM: CHEST 1 VIEW COMPARISON:  08/25/2015 FINDINGS: Normal heart size. No pleural effusion or edema. No airspace opacities identified. The lungs appear hyperinflated with coarsened interstitial markings of emphysema. IMPRESSION: 1. No acute findings. 2. COPD/emphysema. Electronically Signed   By: Kerby Moors M.D.   On: 04/17/2016 20:47     STUDIES:  none  CULTURES: none  ANTIBIOTICS: None   SIGNIFICANT EVENTS: 9/27 Patient admitted to the ICU on BiPAP with COPD exacerbation.  LINES/TUBES: none  DISCUSSION: 65 year old female admitted with COPD exacerbation, on BiPAP  ASSESSMENT / PLAN:  PULMONARY A: Acute on chronic respiratory failure related to COPD Exacerbation Chronic Home O2- 2l P:   Continue to  Support with  BiPAP to keep sats> 92%, Wean as tolerated CXR in am  duoneb /Pulmicort methylprednisone X1 in the ED  CARDIOVASCULAR A:  Hx of hyperlipidemia P:  Continuous telemetry Keep MAP goals>65 Continue lipitor  RENAL A:   No active issues P:   BMET intermittently Replace electrolytes per ICU protocol   GASTROINTESTINAL A:   No active issues P:   Advance diet as tolerated  HEMATOLOGIC A:   No active issues P:  Transfuse if Hgb<7 Heparin for DVT prophylaxis.  INFECTIOUS A:   No active issues P:   Monitor fever curve  ENDOCRINE A:   Diabetese Melitus P:   Blood sugar checks intermittently SSI Coverage  NEUROLOGIC A:    Hx of MDD Hx of GAD P:   Continue cymbalta Alprazolam PRN    Bincy Varughese,AG-ACNP Pulmonary and Critical Care Medicine [facility]   04/17/2016, 11:48 PM  Patient seen and examined with NP, agree with assessment and plan except as recommended. Patient has a history of severe emphysema, review of the blood gas shows chronic hypercapnic respiratory failure with acute hypoxic respiratory failure. She has been maintained on BiPAP overnight with good response and is now breathing better, she can be weaned over to nasal cannula today. She will likely benefit from nocturnal BiPAP while in the inpatient setting. Review over images show severe emphysema without evidence of pneumonia, CT of the chest showed severe anatomical emphysema with rupture of the left breast implant. Lungs are clear with decreased air entry bilaterally.  -Continue IV steroids, we will wean as tolerated. Patient is followed by Dr. Raul Del outpatient, and he can be transferred to Dr. Gust Brooms service when she is transferred out of the intensive care unit.  Marda Stalker, M.D.   04/18/2016   Critical Care Attestation.  I have personally obtained a history, examined the patient, evaluated laboratory and imaging results, formulated the assessment and plan and  placed orders. The Patient requires high complexity decision making for assessment and support, frequent evaluation and titration of therapies, application of advanced monitoring technologies and extensive interpretation of multiple databases. The patient has critical illness that could lead imminently to failure of 1 or more organ systems and requires the highest level of physician preparedness to intervene.  Critical Care Time devoted to patient care services described in this note is 45 minutes and is exclusive of time spent in procedures.

## 2016-04-18 LAB — CBC WITH DIFFERENTIAL/PLATELET
BASOS ABS: 0 10*3/uL (ref 0–0.1)
Basophils Relative: 0 %
EOS PCT: 0 %
Eosinophils Absolute: 0 10*3/uL (ref 0–0.7)
HCT: 33.4 % — ABNORMAL LOW (ref 35.0–47.0)
HEMOGLOBIN: 10.8 g/dL — AB (ref 12.0–16.0)
LYMPHS PCT: 10 %
Lymphs Abs: 0.8 10*3/uL — ABNORMAL LOW (ref 1.0–3.6)
MCH: 29 pg (ref 26.0–34.0)
MCHC: 32.3 g/dL (ref 32.0–36.0)
MCV: 89.8 fL (ref 80.0–100.0)
Monocytes Absolute: 0.1 10*3/uL — ABNORMAL LOW (ref 0.2–0.9)
Monocytes Relative: 1 %
NEUTROS PCT: 89 %
Neutro Abs: 6.4 10*3/uL (ref 1.4–6.5)
PLATELETS: 172 10*3/uL (ref 150–440)
RBC: 3.72 MIL/uL — AB (ref 3.80–5.20)
RDW: 13.5 % (ref 11.5–14.5)
WBC: 7.3 10*3/uL (ref 3.6–11.0)

## 2016-04-18 LAB — COMPREHENSIVE METABOLIC PANEL
ALT: 24 U/L (ref 14–54)
ANION GAP: 4 — AB (ref 5–15)
AST: 29 U/L (ref 15–41)
Albumin: 3.9 g/dL (ref 3.5–5.0)
Alkaline Phosphatase: 90 U/L (ref 38–126)
BUN: 23 mg/dL — ABNORMAL HIGH (ref 6–20)
CHLORIDE: 92 mmol/L — AB (ref 101–111)
CO2: 44 mmol/L — ABNORMAL HIGH (ref 22–32)
Calcium: 9.3 mg/dL (ref 8.9–10.3)
Creatinine, Ser: 0.53 mg/dL (ref 0.44–1.00)
Glucose, Bld: 125 mg/dL — ABNORMAL HIGH (ref 65–99)
POTASSIUM: 4.1 mmol/L (ref 3.5–5.1)
SODIUM: 140 mmol/L (ref 135–145)
Total Bilirubin: 0.9 mg/dL (ref 0.3–1.2)
Total Protein: 7.2 g/dL (ref 6.5–8.1)

## 2016-04-18 LAB — BLOOD GAS, VENOUS
Acid-Base Excess: 23 mmol/L — ABNORMAL HIGH (ref 0.0–2.0)
Bicarbonate: 53.6 mmol/L — ABNORMAL HIGH (ref 20.0–28.0)
DELIVERY SYSTEMS: POSITIVE
FIO2: 0.3
O2 Saturation: 55.8 %
PCO2 VEN: 104 mmHg — AB (ref 44.0–60.0)
PH VEN: 7.32 (ref 7.250–7.430)
Patient temperature: 37
pO2, Ven: 32 mmHg (ref 32.0–45.0)

## 2016-04-18 LAB — GLUCOSE, CAPILLARY
GLUCOSE-CAPILLARY: 136 mg/dL — AB (ref 65–99)
Glucose-Capillary: 129 mg/dL — ABNORMAL HIGH (ref 65–99)
Glucose-Capillary: 124 mg/dL — ABNORMAL HIGH (ref 65–99)
Glucose-Capillary: 132 mg/dL — ABNORMAL HIGH (ref 65–99)

## 2016-04-18 LAB — MAGNESIUM: MAGNESIUM: 3.4 mg/dL — AB (ref 1.7–2.4)

## 2016-04-18 LAB — BLOOD GAS, ARTERIAL
Acid-Base Excess: 16.9 mmol/L — ABNORMAL HIGH (ref 0.0–2.0)
BICARBONATE: 44.8 mmol/L — AB (ref 20.0–28.0)
FIO2: 28
O2 SAT: 87.9 %
PATIENT TEMPERATURE: 37
PO2 ART: 55 mmHg — AB (ref 83.0–108.0)
pCO2 arterial: 74 mmHg (ref 32.0–48.0)
pH, Arterial: 7.39 (ref 7.350–7.450)

## 2016-04-18 LAB — PHOSPHORUS: PHOSPHORUS: 2.8 mg/dL (ref 2.5–4.6)

## 2016-04-18 LAB — MRSA PCR SCREENING: MRSA BY PCR: NEGATIVE

## 2016-04-18 MED ORDER — MORPHINE SULFATE 15 MG PO TABS
7.5000 mg | ORAL_TABLET | ORAL | Status: DC | PRN
  Administered 2016-04-18 – 2016-04-21 (×11): 7.5 mg via ORAL
  Filled 2016-04-18 (×11): qty 1

## 2016-04-18 MED ORDER — GUAIFENESIN-DM 100-10 MG/5ML PO SYRP
5.0000 mL | ORAL_SOLUTION | Freq: Four times a day (QID) | ORAL | Status: DC | PRN
  Administered 2016-04-19: 5 mL via ORAL
  Filled 2016-04-18: qty 5

## 2016-04-18 MED ORDER — ORAL CARE MOUTH RINSE
15.0000 mL | Freq: Two times a day (BID) | OROMUCOSAL | Status: DC
  Administered 2016-04-18 – 2016-04-21 (×3): 15 mL via OROMUCOSAL

## 2016-04-18 MED ORDER — INSULIN ASPART 100 UNIT/ML ~~LOC~~ SOLN: 0.0000 [IU] | Freq: Every day | SUBCUTANEOUS | Status: DC

## 2016-04-18 MED ORDER — ATORVASTATIN CALCIUM 20 MG PO TABS
40.0000 mg | ORAL_TABLET | Freq: Every day | ORAL | Status: DC
  Administered 2016-04-18 – 2016-04-20 (×3): 40 mg via ORAL
  Filled 2016-04-18 (×3): qty 2

## 2016-04-18 MED ORDER — METHYLPREDNISOLONE SODIUM SUCC 40 MG IJ SOLR
40.0000 mg | Freq: Three times a day (TID) | INTRAMUSCULAR | Status: DC
  Administered 2016-04-18 – 2016-04-21 (×10): 40 mg via INTRAVENOUS
  Filled 2016-04-18 (×10): qty 1

## 2016-04-18 MED ORDER — INSULIN ASPART 100 UNIT/ML ~~LOC~~ SOLN
2.0000 [IU] | SUBCUTANEOUS | Status: DC
  Administered 2016-04-18 (×4): 2 [IU] via SUBCUTANEOUS
  Filled 2016-04-18 (×4): qty 2

## 2016-04-18 MED ORDER — ONDANSETRON HCL 4 MG/2ML IJ SOLN
4.0000 mg | Freq: Four times a day (QID) | INTRAMUSCULAR | Status: DC | PRN
  Administered 2016-04-18: 4 mg via INTRAVENOUS
  Filled 2016-04-18 (×3): qty 2

## 2016-04-18 MED ORDER — GUAIFENESIN-DM 100-10 MG/5ML PO SYRP: 5.0000 mL | ORAL_SOLUTION | Freq: Four times a day (QID) | ORAL | Status: DC

## 2016-04-18 MED ORDER — CHLORHEXIDINE GLUCONATE 0.12 % MT SOLN: 15.0000 mL | Freq: Two times a day (BID) | OROMUCOSAL | Status: DC

## 2016-04-18 MED ORDER — INFLUENZA VAC SPLIT QUAD 0.5 ML IM SUSY: 0.5000 mL | PREFILLED_SYRINGE | INTRAMUSCULAR | Status: DC

## 2016-04-18 MED ORDER — BUDESONIDE 0.5 MG/2ML IN SUSP
0.5000 mg | Freq: Two times a day (BID) | RESPIRATORY_TRACT | Status: DC
  Administered 2016-04-18 – 2016-04-21 (×7): 0.5 mg via RESPIRATORY_TRACT
  Filled 2016-04-18 (×7): qty 2

## 2016-04-18 MED ORDER — INFLUENZA VAC SPLIT QUAD 0.5 ML IM SUSY: 0.5000 mL | PREFILLED_SYRINGE | INTRAMUSCULAR | Status: DC | PRN

## 2016-04-18 MED ORDER — ALPRAZOLAM 0.25 MG PO TABS
0.2500 mg | ORAL_TABLET | Freq: Two times a day (BID) | ORAL | Status: DC | PRN
  Administered 2016-04-18 – 2016-04-20 (×6): 0.25 mg via ORAL
  Filled 2016-04-18 (×6): qty 1

## 2016-04-18 MED ORDER — INSULIN ASPART 100 UNIT/ML ~~LOC~~ SOLN
0.0000 [IU] | Freq: Three times a day (TID) | SUBCUTANEOUS | Status: DC
  Administered 2016-04-19: 3 [IU] via SUBCUTANEOUS
  Administered 2016-04-20: 2 [IU] via SUBCUTANEOUS
  Administered 2016-04-20 – 2016-04-21 (×3): 3 [IU] via SUBCUTANEOUS
  Filled 2016-04-18 (×3): qty 3
  Filled 2016-04-18: qty 2
  Filled 2016-04-18 (×2): qty 3

## 2016-04-18 MED ORDER — DULOXETINE HCL 30 MG PO CPEP
90.0000 mg | ORAL_CAPSULE | Freq: Every day | ORAL | Status: DC
  Administered 2016-04-18 – 2016-04-21 (×4): 90 mg via ORAL
  Filled 2016-04-18 (×4): qty 3

## 2016-04-18 MED ORDER — PANTOPRAZOLE SODIUM 40 MG PO TBEC
40.0000 mg | DELAYED_RELEASE_TABLET | Freq: Every day | ORAL | Status: DC
  Administered 2016-04-18 – 2016-04-21 (×4): 40 mg via ORAL
  Filled 2016-04-18 (×4): qty 1

## 2016-04-18 NOTE — Progress Notes (Signed)
RN  Reginal Lutes and spoke with Dr. Halford Chessman and asked about patient being moved to regular room and also asked for PRN med for nausea because patient is complaining of nausea.  Patient has been off BiPAP and on 2L nasal cannula during entire day shift.  Patient uses 2L o2 chronically at home.  MD acknowledged and stated he would take a look and place orders.

## 2016-04-18 NOTE — Progress Notes (Signed)
Dr. Stevenson Clinch present and gave order that patient can be moved to any med surg floor with tele.

## 2016-04-18 NOTE — Progress Notes (Signed)
Report given to Mia Creek, RN at [phone] via telephone.  This RN attempted to call report at [phone] and was told by Danae Chen, clerk that nurse was in an isolation room and took this RN's ascom number for Mia Creek to call back for report. Patient moved to room 235 at 1915 by bed with this RN and Kennyth Lose, [place].  Patient transferred on tele and o2.  This RN updated Mia Creek, RN that patient was given nausea medication prior to transfer.

## 2016-04-18 NOTE — Progress Notes (Addendum)
Update: Patient doing well, off Bipap since this AM, no respiratory distress. Stable to transfer to the [facility] with tele. Spoke with Hospitalist, Dr. Bridgett Larsson, hospitalist to assume care 9/29 AM.  Transfer to pulmonary care to Dr. Raul Del, primary pulmonologist.   Vilinda Boehringer, MD [facility] Pager [phone] (please enter 7-digits) On Call Pager - [phone] (please enter 7-digits)

## 2016-04-18 NOTE — Progress Notes (Signed)
Acute on chronic respiratory failure related to COPD Exacerbation Chronic Home O2- 2l  Off BiPAP. Continue O2 [place]. Stable to transfer to the Medsurg with tele per Dr. Stevenson Clinch.

## 2016-04-19 LAB — GLUCOSE, CAPILLARY
GLUCOSE-CAPILLARY: 143 mg/dL — AB (ref 65–99)
Glucose-Capillary: 140 mg/dL — ABNORMAL HIGH (ref 65–99)
Glucose-Capillary: 149 mg/dL — ABNORMAL HIGH (ref 65–99)
Glucose-Capillary: 129 mg/dL — ABNORMAL HIGH (ref 65–99)

## 2016-04-19 LAB — BASIC METABOLIC PANEL
Anion gap: 8 (ref 5–15)
BUN: 21 mg/dL — AB (ref 6–20)
CALCIUM: 8.9 mg/dL (ref 8.9–10.3)
CHLORIDE: 98 mmol/L — AB (ref 101–111)
CO2: 37 mmol/L — ABNORMAL HIGH (ref 22–32)
CREATININE: 0.6 mg/dL (ref 0.44–1.00)
GFR calc Af Amer: >60 mL/min (ref >60)
GFR calc non Af Amer: >60 mL/min (ref >60)
Glucose, Bld: 154 mg/dL — ABNORMAL HIGH (ref 65–99)
Potassium: 4.6 mmol/L (ref 3.5–5.1)
SODIUM: 143 mmol/L (ref 135–145)

## 2016-04-19 LAB — CBC
HCT: 31.2 % — ABNORMAL LOW (ref 35.0–47.0)
HEMOGLOBIN: 10.2 g/dL — AB (ref 12.0–16.0)
MCH: 29.1 pg (ref 26.0–34.0)
MCHC: 32.7 g/dL (ref 32.0–36.0)
MCV: 88.9 fL (ref 80.0–100.0)
PLATELETS: 175 10*3/uL (ref 150–440)
RBC: 3.51 MIL/uL — ABNORMAL LOW (ref 3.80–5.20)
RDW: 13.6 % (ref 11.5–14.5)
WBC: 8.5 10*3/uL (ref 3.6–11.0)

## 2016-04-19 NOTE — Consult Note (Signed)
CH met with RN; Pt unavailable and asleep.  Ch will pass on to daytime on-call [place] to follow-up. Tiffany Mclean 6:56 AM

## 2016-04-19 NOTE — Consult Note (Signed)
GI Inpatient Consult Note  Reason for Consult:vomiting,dysphagia   Attending Requesting Consult:Dr. Ether Griffins  History of Present Illness: Tiffany Mclean is a 65 y.o. female with a problem with vomiting  And hematemesis.  She has been ill for several years, but has not had vomiting for a year or more until now.  She has been hospitalized many times over the years.  She reports blood in vomitus and nose.  She has been on home oxygen for several years but this is the first vomiting in a year.  She has had an EGD with Dr. Dionne Milo about 3 -4 years ago.  She reports 15 or more surgeries also.  Due to complexity of patient and the consult was for dysphagia I will limit myself to this problem only.      Past Medical History:  Past Medical History:  Diagnosis Date  . Cancer (HCC)    squamous cell, vaginal  . COPD (chronic obstructive pulmonary disease) ([place])   . Diabetes mellitus without complication ([place])   . Pneumonia     Problem List: Patient Active Problem List   Diagnosis Date Noted  . COPD exacerbation ([place]) 04/17/2016  . Tremor 06/13/2015  . Depression 05/15/2015  . Anxiety 02/13/2015  . Dermatitis, eczematoid 02/13/2015  . Hypokalemia 02/13/2015  . Lower extremity weakness 02/13/2015  . Leukocytosis 02/13/2015  . Magnesium deficiency 02/13/2015  . Symptomatic menopausal or female climacteric states 02/13/2015  . Panic disorder 02/13/2015  . Bed sore on buttock 02/08/2015  . COPD with emphysema ([place]) 11/28/2014  . Chronic respiratory failure with hypoxia ([place]) 11/28/2014  . GERD (gastroesophageal reflux disease) 11/28/2014  . Anemia of chronic disease 11/28/2014  . Pneumonia 11/23/2014  . Generalized anxiety disorder 11/23/2014  . Injury of kidney 10/31/2014  . Personal history of perinatal problems 10/24/2014  . Chronic pain associated with significant psychosocial dysfunction 02/21/2010  . Anemia, iron deficiency 01/29/2010  . Allergic rhinitis 12/01/2009  . DDD  (degenerative disc disease), cervical 12/01/2009  . Major depression ([place]) 06/30/2009  . Abdominal pain, generalized 03/21/2009  . Vitamin D deficiency 07/22/2008  . Hypercholesterolemia without hypertriglyceridemia 03/11/2007  . Essential and other specified forms of tremor 07/22/1998    Past Surgical History: Past Surgical History:  Procedure Laterality Date  . ABDOMINAL HYSTERECTOMY  1996   BSO. Dr. Ammie Dalton  . BREAST ENHANCEMENT SURGERY     rupture  . BREAST SURGERY  1983   Breast Augmentation  . [place]  . CHOLECYSTECTOMY  12/20/2010   Laparoscopic, Dr. Marina Gravel, [facility]  . EYE SURGERY     1977  . TUBAL LIGATION  1986    Allergies: Allergies  Allergen Reactions  . Asa [Aspirin]   . Avelox [Moxifloxacin]   . Cephalexin   . Codeine   . Erythromycin Ethylsuccinate   . Flagyl [Metronidazole]   . Latex   . Levaquin [Levofloxacin] Hives  . Meloxicam   . Shellfish Allergy Nausea And Vomiting and Other (See Comments)    Dizzy, fainting  . Sulfa Antibiotics   . Topiramate   . Doxycycline Rash  . Tetracyclines & Related Rash    Home Medications: Prescriptions Prior to Admission  Medication Sig Dispense Refill Last Dose  . acetaminophen (RA ACETAMINOPHEN) 650 MG CR tablet Take 650 mg by mouth every 8 (eight) hours as needed for pain or fever.    prn at prn  . albuterol (ACCUNEB) 1.25 MG/3ML nebulizer solution Take 1 ampule by nebulization 4 (four) times  daily as needed for shortness of breath.   Taking  . albuterol (PROVENTIL HFA;VENTOLIN HFA) 108 (90 Base) MCG/ACT inhaler Inhale 2 puffs into the lungs every 6 (six) hours as needed for wheezing or shortness of breath. 3 Inhaler 3 prn at prn  . ALPRAZolam (XANAX) 0.25 MG tablet Take 0.25 mg by mouth 2 (two) times daily as needed.   04/17/2016 at Unknown time  . atorvastatin (LIPITOR) 40 MG tablet 1 (ONE) TABLET, ORAL, DAILY BY MOUTH 90 tablet 4 04/17/2016 at Unknown time  . DULoxetine (CYMBALTA) 30 MG  capsule Take 3 capsules (90 mg total) by mouth daily. 270 capsule 3 04/17/2016 at Unknown time  . fluticasone (FLONASE) 50 MCG/ACT nasal spray Place 1 spray into both nostrils daily.   04/17/2016 at Unknown time  . ipratropium (ATROVENT) 0.02 % nebulizer solution Take 0.5 mg by nebulization every 6 (six) hours as needed for wheezing or shortness of breath.   Taking  . morphine (MSIR) 15 MG tablet Take 0.5 tablets (7.5 mg total) by mouth every 4 (four) hours as needed for moderate pain or severe pain ((4-6/10)). (Patient taking differently: Take 7.5 mg by mouth every 4 (four) hours as needed for moderate pain or severe pain. ) 100 tablet 0 04/17/2016 at Unknown time  . RABEprazole (ACIPHEX) 20 MG tablet Take 1 tablet (20 mg total) by mouth daily. 90 tablet 3 04/17/2016 at Unknown time  . tiotropium (SPIRIVA HANDIHALER) 18 MCG inhalation capsule Place 1 capsule (18 mcg total) into inhaler and inhale daily. 90 capsule 4 04/17/2016 at Unknown time  . [DISCONTINUED] acyclovir ointment (ZOVIRAX) 5 % Apply 1 application topically every 3 (three) hours. (Patient not taking: Reported on 04/17/2016) 15 g 1 Not Taking at Unknown time  . [DISCONTINUED] clindamycin (CLEOCIN) 300 MG capsule Take 1 capsule (300 mg total) by mouth 3 (three) times daily. (Patient not taking: Reported on 04/17/2016) 30 capsule 0 Not Taking at Unknown time  . [DISCONTINUED] [employer] ALLERGY RELIEF-D 5-120 MG per tablet TAKE 1 TABLET BY MOUTH 3 TIMES A DAY AS NEEDED (Patient not taking: Reported on 04/17/2016) 60 tablet 3 Not Taking at Unknown time  . [DISCONTINUED] mupirocin cream (BACTROBAN) 2 % Apply 1 application topically 2 (two) times daily. (Patient not taking: Reported on 04/17/2016) 15 g 1 Not Taking at Unknown time  . [DISCONTINUED] primidone (MYSOLINE) 50 MG tablet 1/2 tablet at bedtime for 6 days, then increase to 1 tablet at bedtime for 6 days, then increase to 2 tablets at bedtime (Patient not taking: Reported on 04/17/2016) 180 tablet 4 Not  Taking at Unknown time  . [DISCONTINUED] triamcinolone ointment (KENALOG) 0.5 % Apply 1 application topically 2 (two) times daily. (Patient not taking: Reported on 04/17/2016) 30 g 1 Not Taking at Unknown time   Home medication reconciliation was completed with the patient.   Scheduled Inpatient Medications:   . albuterol  5 mg Nebulization Once  . atorvastatin  40 mg Oral q1800  . budesonide  0.5 mg Nebulization BID  . DULoxetine  90 mg Oral Daily  . heparin  5,000 Units Subcutaneous Q8H  . insulin aspart  0-20 Units Subcutaneous TID WC  . insulin aspart  0-5 Units Subcutaneous QHS  . ipratropium-albuterol  3 mL Nebulization Q4H  . mouth rinse  15 mL Mouth Rinse q12n4p  . methylPREDNISolone (SOLU-MEDROL) injection  40 mg Intravenous Q8H  . pantoprazole  40 mg Oral Daily  . tiotropium  18 mcg Inhalation Daily    Continuous Inpatient Infusions:   .  sodium chloride 50 mL/hr at 04/18/16 1440    PRN Inpatient Medications:  sodium chloride, ALPRAZolam, guaiFENesin-dextromethorphan, Influenza vac split quadrivalent PF, ipratropium-albuterol, morphine, ondansetron (ZOFRAN) IV  Family History: family history includes Arthritis in her father and mother; CAD in her mother; Emphysema in her father; Gout in her father and mother; Hypertension in her father and mother; Kidney disease in her father and mother; Stroke in her mother; Ulcers in her father.  The patient's family history is negative for inflammatory bowel disorders, GI malignancy, or solid organ transplantation.  Social History:   reports that she quit smoking about 7 years ago. Her smoking use included Cigarettes. She has a 45.00 pack-year smoking history. She quit smokeless tobacco use about 7 years ago. She reports that she does not drink alcohol or use drugs. The patient denies ETOH, tobacco, or drug use.   Review of Systems: Constitutional: Weight is stable.  Eyes: No changes in vision. ENT: No oral lesions, sore throat.  GI:  see HPI.  Heme/Lymph: No easy bruising.  CV: No chest pain.  GU: No hematuria.  Integumentary: No rashes.  Neuro: No headaches.  Psych: No depression/anxiety.  Endocrine: No heat/cold intolerance.  Allergic/Immunologic: No urticaria.  Resp: breathing problems, 24/7 oxygen for 3 years at least, difficulty getting a deep breath  Musculoskeletal: No joint swelling.    Physical Examination: BP 100/65 (BP Location: Left Arm)   Pulse 87   Temp 98.3 F (36.8 C) (Oral)   Resp 16   Ht 5\' 5"  (1.651 m)   Wt 59.4 kg (131 lb)   SpO2 100%   BMI 21.80 kg/m  Gen: NAD, alert and oriented x 4 HEENT: PEERLA, EOMI, Neck: supple, no JVD or thyromegaly Chest: Poor air flow, decreased breath sounds in all lung fields CV: tachycardia Abd: soft, NT, ND, +BS in all four quadrants; no HSM, guarding, ridigity, or rebound tenderness Ext: no edema, well perfused with 2+ pulses, Skin: no rash or lesions noted Lymph: no LAD  Data: Lab Results  Component Value Date   WBC 8.5 04/19/2016   HGB 10.2 (L) 04/19/2016   HCT 31.2 (L) 04/19/2016   MCV 88.9 04/19/2016   PLT 175 04/19/2016    Recent Labs Lab 04/17/16 2022 04/18/16 0011 04/19/16 0357  HGB 11.1* 10.8* 10.2*   Lab Results  Component Value Date   NA 143 04/19/2016   K 4.6 04/19/2016   CL 98 (L) 04/19/2016   CO2 37 (H) 04/19/2016   BUN 21 (H) 04/19/2016   CREATININE 0.60 04/19/2016   GLU 106 09/12/2014   Lab Results  Component Value Date   ALT 24 04/18/2016   AST 29 04/18/2016   ALKPHOS 90 04/18/2016   BILITOT 0.9 04/18/2016   No results for input(s): APTT, INR, PTT in the last 168 hours. Assessment/Plan: Tiffany Mclean is a 65 y.o. female with dysphagia, she is not an elective EGD candidate due to her lungs but she could have a barium swallow with tablet to evaluate her swallowing and maybe get an UGI at same time.  No other suggestions at this time.  Recommendations:  Thank you for the consult. Please call with questions or  concerns.  Gaylyn Cheers, MD

## 2016-04-19 NOTE — Progress Notes (Addendum)
[facility] [place] NAME: Tiffany Mclean    MR#:  [id number]  DATE OF BIRTH:  Jan 08, 1951  SUBJECTIVE:  CHIEF COMPLAINT:   Chief Complaint  Patient presents with  . Shortness of Breath  The patient is a 65 year old Caucasian female with medical history significant for history of COPD, former tobacco abuse, quit about 9 years ago, chronic respiratory failure on 2 L of oxygen through nasal cannula at home, who presents to the hospital with shortness of breath, O2 sats were low as 82% on 2 L of oxygen via nasal cannula. Chest x-ray revealed  hyperinflation. Admits of pills/food getting stuck in esophagus, also regurgitation  Review of Systems  Constitutional: Negative for chills, fever and weight loss.  HENT: Negative for congestion.   Eyes: Negative for blurred vision and double vision.  Respiratory: Positive for cough, sputum production, shortness of breath and wheezing.   Cardiovascular: Negative for chest pain, palpitations, orthopnea, leg swelling and PND.  Gastrointestinal: Negative for abdominal pain, blood in stool, constipation, diarrhea, nausea and vomiting.  Genitourinary: Negative for dysuria, frequency, hematuria and urgency.  Musculoskeletal: Negative for falls.  Neurological: Negative for dizziness, tremors, focal weakness and headaches.  Endo/Heme/Allergies: Does not bruise/bleed easily.  Psychiatric/Behavioral: Negative for depression. The patient does not have insomnia.     VITAL SIGNS: Blood pressure 100/65, pulse 87, temperature 98.3 F (36.8 C), temperature source Oral, resp. rate 16, height 5\' 5"  (1.651 m), weight 59.4 kg (131 lb), SpO2 100 %.  PHYSICAL EXAMINATION:   GENERAL:  65 y.o.-year-old patient lying in the bed with no acute distress.  EYES: Pupils equal, round, reactive to light and accommodation. No scleral icterus. Extraocular muscles intact.  HEENT: Head atraumatic, normocephalic. Oropharynx and  nasopharynx clear.  NECK:  Supple, no jugular venous distention. No thyroid enlargement, no tenderness.  LUNGS: Diminished breath sounds bilaterally, bilateral scattered wheezing, no rales,rhonchi or crepitation. No use of accessory muscles of respiration.  CARDIOVASCULAR: S1, S2 normal. No murmurs, rubs, or gallops.  ABDOMEN: Soft, nontender, nondistended. Bowel sounds present. No organomegaly or mass.  EXTREMITIES: No pedal edema, cyanosis, or clubbing.  NEUROLOGIC: Cranial nerves II through XII are intact. Muscle strength 5/5 in all extremities. Sensation intact. Gait not checked.  PSYCHIATRIC: The patient is alert and oriented x 3.  SKIN: No obvious rash, lesion, or ulcer.   ORDERS/RESULTS REVIEWED:   CBC  Recent Labs Lab 04/17/16 2022 04/18/16 0011 04/19/16 0357  WBC 6.6 7.3 8.5  HGB 11.1* 10.8* 10.2*  HCT 34.8* 33.4* 31.2*  PLT 172 172 175  MCV 89.9 89.8 88.9  MCH 28.7 29.0 29.1  MCHC 31.9* 32.3 32.7  RDW 13.3 13.5 13.6  LYMPHSABS 1.7 0.8*  --   MONOABS 0.6 0.1*  --   EOSABS 0.1 0.0  --   BASOSABS 0.0 0.0  --    ------------------------------------------------------------------------------------------------------------------  Chemistries   Recent Labs Lab 04/17/16 2022 04/18/16 0011 04/19/16 0357  NA 141 140 143  K 4.2 4.1 4.6  CL 93* 92* 98*  CO2 44* 44* 37*  GLUCOSE 124* 125* 154*  BUN 22* 23* 21*  CREATININE 0.56 0.53 0.60  CALCIUM 9.5 9.3 8.9  MG 1.7 3.4*  --   AST 29 29  --   ALT 25 24  --   ALKPHOS 91 90  --   BILITOT 0.8 0.9  --    ------------------------------------------------------------------------------------------------------------------ estimated creatinine clearance is 62.2 mL/min (by C-G formula based on SCr of 0.6  mg/dL). ------------------------------------------------------------------------------------------------------------------ No results for input(s): TSH, T4TOTAL, T3FREE, THYROIDAB in the last 72 hours.  Invalid input(s):  FREET3  Cardiac Enzymes  Recent Labs Lab 04/17/16 2022  TROPONINI <0.03   ------------------------------------------------------------------------------------------------------------------ Invalid input(s): POCBNP ---------------------------------------------------------------------------------------------------------------  RADIOLOGY: Dg Chest 1 View  Result Date: 04/17/2016 CLINICAL DATA:  Shortness of breath.  COPD EXAM: CHEST 1 VIEW COMPARISON:  08/25/2015 FINDINGS: Normal heart size. No pleural effusion or edema. No airspace opacities identified. The lungs appear hyperinflated with coarsened interstitial markings of emphysema. IMPRESSION: 1. No acute findings. 2. COPD/emphysema. Electronically Signed   By: Kerby Moors M.D.   On: 04/17/2016 20:47    EKG:  Orders placed or performed during the hospital encounter of 04/17/16  . ED EKG  . ED EKG  . EKG 12-Lead  . EKG 12-Lead    ASSESSMENT AND PLAN:  Active Problems:   COPD exacerbation ([place])  #1. Acute on chronic respiratory failure with hypoxia and hypercapnia due to COPD exacerbation, improved with therapy, continue oxygen therapy, back on 2 L of oxygen with good O2 sats, now on BiPAP #2 COPD exacerbation, now on BiPAP, continue steroids, albuterol, DuoNeb, Pulmicort, Spiriva #3. Acute bronchitis, sputum cultures, no antibiotic therapy due to concerns of aspiration related lung dysfunction #4. Diabetes mellitus type II, continue diabetic diet, sliding scale insulin while in the hospital #5 Dysphagia, getting GI involved, continue PPI, get Barium swallowing test with upper Gi   Management plans discussed with the patient, family and they are in agreement.   DRUG ALLERGIES:  Allergies  Allergen Reactions  . Asa [Aspirin]   . Avelox [Moxifloxacin]   . Cephalexin   . Codeine   . Erythromycin Ethylsuccinate   . Flagyl [Metronidazole]   . Latex   . Levaquin [Levofloxacin] Hives  . Meloxicam   . Shellfish Allergy  Nausea And Vomiting and Other (See Comments)    Dizzy, fainting  . Sulfa Antibiotics   . Topiramate   . Doxycycline Rash  . Tetracyclines & Related Rash    CODE STATUS:     Code Status Orders        Start     Ordered   04/17/16 2311  Full code  Continuous     04/17/16 2313    Code Status History    Date Active Date Inactive Code Status Order ID Comments User Context   This patient has a current code status but no historical code status.    Advance Directive Documentation   [place] Most Recent Value  Type of Advance Directive  Living will  Pre-existing out of facility DNR order (yellow form or pink MOST form)  No data  "MOST" Form in Place?  No data      TOTAL TIME TAKING CARE OF THIS PATIENT: 40 minutes.    Theodoro Grist M.D on 04/19/2016 at 2:32 PM  Between 7am to 6pm - Pager - [phone]  After 6pm go to [email or web address] - password [email or web address] [facility]  Office  [phone]  CC: Primary care physician; Lelon Huh, MD

## 2016-04-19 NOTE — Progress Notes (Signed)
CSW was informed that patient may be from [facility]. Per admissions Coordinator- [name] patient discharged from [facility] 02/01/2015. CSW is signing off but is available if a need were to arise.  Ernest Pine, MSW, LCSW, [place] Clinical Social Worker [phone]

## 2016-04-19 NOTE — Progress Notes (Signed)
Patient spoke with chaplain for over an hour about concerns. Patient is very anxious now, complaining of chest and back pain. Patient is requesting tylenol. Will notify physician for order. Patient has made multiple calls for the nurse tech and nurse to the room.

## 2016-04-19 NOTE — Progress Notes (Signed)
Patient is very anxious. Patient expressed that she does not want to be alone. Notified the chaplain to assist with comfort. Spouse is currently at the bedside too. Will continue to monitor and assess.

## 2016-04-19 NOTE — Care Management Important Message (Signed)
Important Message  Patient Details  Name: Tiffany Mclean MRN: [id number] Date of Birth: 01/14/1951   Medicare Important Message Given:  Yes    Katrina Stack, RN 04/19/2016, 10:23 AM

## 2016-04-19 NOTE — Care Management (Signed)
Patient was admitted 9/27 to icu due to need for continuous bipap.  She has transferred out to 2A.Patient tells cm that she is from home.  She lives with her husband.  She does not walk due to a chronic condition in her lower extremities.   Chronic home 02 from [place].  Informed during progression that patient is from [facility].  Found that patient is not from [facility].  Her previous stay was July 2016.  Her PCP is Harlan Stains and is current.  She would be agreeable to home health services if it is indicated.

## 2016-04-19 NOTE — Care Management (Signed)
Patient was trying to get situated and more comfortable in her bed when I entered. Her husband was in there tending to her and was available to hear our conversation on services provided and the possibilities. Patient was gracious and will reach out if she needs anything.

## 2016-04-20 LAB — GLUCOSE, CAPILLARY
Glucose-Capillary: 118 mg/dL — ABNORMAL HIGH (ref 65–99)
Glucose-Capillary: 132 mg/dL — ABNORMAL HIGH (ref 65–99)
Glucose-Capillary: 122 mg/dL — ABNORMAL HIGH (ref 65–99)
Glucose-Capillary: 172 mg/dL — ABNORMAL HIGH (ref 65–99)

## 2016-04-20 MED ORDER — IPRATROPIUM-ALBUTEROL 0.5-2.5 (3) MG/3ML IN SOLN
3.0000 mL | Freq: Three times a day (TID) | RESPIRATORY_TRACT | Status: DC
  Administered 2016-04-20 – 2016-04-21 (×5): 3 mL via RESPIRATORY_TRACT
  Filled 2016-04-20 (×5): qty 3

## 2016-04-20 MED ORDER — ALPRAZOLAM 0.5 MG PO TABS
0.5000 mg | ORAL_TABLET | Freq: Three times a day (TID) | ORAL | Status: DC | PRN
Start: 2016-04-20 — End: 2016-04-21
  Administered 2016-04-20 – 2016-04-21 (×4): 0.5 mg via ORAL
  Filled 2016-04-20 (×4): qty 1

## 2016-04-20 NOTE — Progress Notes (Signed)
[facility] [place] NAME: Tiffany Mclean    MR#:  [id number]  DATE OF BIRTH:  11-13-1950  SUBJECTIVE:  CHIEF COMPLAINT:   Chief Complaint  Patient presents with  . Shortness of Breath  The patient is a 65 year old Caucasian female with medical history significant for history of COPD, former tobacco abuse, quit about 9 years ago, chronic respiratory failure on 2 L of oxygen through nasal cannula at home, who presents to the hospital with shortness of breath, O2 sats were low as 82% on 2 L of oxygen via nasal cannula. Chest x-ray revealed  hyperinflation. Admits of pills/food getting stuck in esophagus, also regurgitation Patient feels satisfactory today, continues to have some cough with minimal sputum production, some shortness of breath, intermittent wheezing  Review of Systems  Constitutional: Negative for chills, fever and weight loss.  HENT: Negative for congestion.   Eyes: Negative for blurred vision and double vision.  Respiratory: Positive for cough, sputum production, shortness of breath and wheezing.   Cardiovascular: Negative for chest pain, palpitations, orthopnea, leg swelling and PND.  Gastrointestinal: Negative for abdominal pain, blood in stool, constipation, diarrhea, nausea and vomiting.  Genitourinary: Negative for dysuria, frequency, hematuria and urgency.  Musculoskeletal: Negative for falls.  Neurological: Negative for dizziness, tremors, focal weakness and headaches.  Endo/Heme/Allergies: Does not bruise/bleed easily.  Psychiatric/Behavioral: Negative for depression. The patient does not have insomnia.     VITAL SIGNS: Blood pressure (!) 109/48, pulse 90, temperature 98 F (36.7 C), temperature source Oral, resp. rate 18, height 5\' 5"  (1.651 m), weight 60.9 kg (134 lb 3.2 oz), SpO2 (!) 61 %.  PHYSICAL EXAMINATION:   GENERAL:  65 y.o.-year-old patient lying in the bed with no acute distress.  EYES: Pupils  equal, round, reactive to light and accommodation. No scleral icterus. Extraocular muscles intact.  HEENT: Head atraumatic, normocephalic. Oropharynx and nasopharynx clear.  NECK:  Supple, no jugular venous distention. No thyroid enlargement, no tenderness.  LUNGS: Diminished breath sounds bilaterally, bilateral scattered wheezing, no rales,rhonchi or crepitation. No use of accessory muscles of respiration.  CARDIOVASCULAR: S1, S2 normal. No murmurs, rubs, or gallops.  ABDOMEN: Soft, nontender, nondistended. Bowel sounds present. No organomegaly or mass.  EXTREMITIES: No pedal edema, cyanosis, or clubbing.  NEUROLOGIC: Cranial nerves II through XII are intact. Muscle strength 5/5 in all extremities. Sensation intact. Gait not checked.  PSYCHIATRIC: The patient is alert and oriented x 3.  SKIN: No obvious rash, lesion, or ulcer.   ORDERS/RESULTS REVIEWED:   CBC  Recent Labs Lab 04/17/16 2022 04/18/16 0011 04/19/16 0357  WBC 6.6 7.3 8.5  HGB 11.1* 10.8* 10.2*  HCT 34.8* 33.4* 31.2*  PLT 172 172 175  MCV 89.9 89.8 88.9  MCH 28.7 29.0 29.1  MCHC 31.9* 32.3 32.7  RDW 13.3 13.5 13.6  LYMPHSABS 1.7 0.8*  --   MONOABS 0.6 0.1*  --   EOSABS 0.1 0.0  --   BASOSABS 0.0 0.0  --    ------------------------------------------------------------------------------------------------------------------  Chemistries   Recent Labs Lab 04/17/16 2022 04/18/16 0011 04/19/16 0357  NA 141 140 143  K 4.2 4.1 4.6  CL 93* 92* 98*  CO2 44* 44* 37*  GLUCOSE 124* 125* 154*  BUN 22* 23* 21*  CREATININE 0.56 0.53 0.60  CALCIUM 9.5 9.3 8.9  MG 1.7 3.4*  --   AST 29 29  --   ALT 25 24  --   ALKPHOS 91 90  --  BILITOT 0.8 0.9  --    ------------------------------------------------------------------------------------------------------------------ estimated creatinine clearance is 62.2 mL/min (by C-G formula based on SCr of 0.6  mg/dL). ------------------------------------------------------------------------------------------------------------------ No results for input(s): TSH, T4TOTAL, T3FREE, THYROIDAB in the last 72 hours.  Invalid input(s): FREET3  Cardiac Enzymes  Recent Labs Lab 04/17/16 2022  TROPONINI <0.03   ------------------------------------------------------------------------------------------------------------------ Invalid input(s): POCBNP ---------------------------------------------------------------------------------------------------------------  RADIOLOGY: No results found.  EKG:  Orders placed or performed during the hospital encounter of 04/17/16  . ED EKG  . ED EKG  . EKG 12-Lead  . EKG 12-Lead    ASSESSMENT AND PLAN:  Active Problems:   COPD exacerbation ([place])  #1. Acute on chronic respiratory failure with hypoxia and hypercapnia due to COPD exacerbation, improved with therapy, continue oxygen therapy, back on 2 L of oxygen with good O2 sats, now off BiPAP #2 COPD exacerbation, now off BiPAP, continue steroids, albuterol, DuoNeb, Pulmicort, Spiriva, it is concerning that COPD exacerbation could be due to aspiration and further regurgitation, speech therapist evaluation is performed, dysphagia 2 diet with thin liquids is recommended, patient was advised to follow up with gastroenterologist and ENT as outpatient, get barium swallow study as outpatient #3. Acute bronchitis, sputum culture is pending, no antibiotic therapy for now until cultures are known  #4. Diabetes mellitus type II, continue diabetic diet, sliding scale insulin while in the hospital #5 Dysphagia, appreciate GI involved, continue PPI, Barium swallowing test with upper Gi as outpatient  Management plans discussed with the patient, family and they are in agreement.   DRUG ALLERGIES:  Allergies  Allergen Reactions  . Asa [Aspirin]   . Avelox [Moxifloxacin]   . Cephalexin   . Codeine   . Erythromycin  Ethylsuccinate   . Flagyl [Metronidazole]   . Latex   . Levaquin [Levofloxacin] Hives  . Meloxicam   . Shellfish Allergy Nausea And Vomiting and Other (See Comments)    Dizzy, fainting  . Sulfa Antibiotics   . Topiramate   . Doxycycline Rash  . Tetracyclines & Related Rash    CODE STATUS:     Code Status Orders        Start     Ordered   04/17/16 2311  Full code  Continuous     04/17/16 2313    Code Status History    Date Active Date Inactive Code Status Order ID Comments User Context   This patient has a current code status but no historical code status.    Advance Directive Documentation   [place] Most Recent Value  Type of Advance Directive  Living will  Pre-existing out of facility DNR order (yellow form or pink MOST form)  No data  "MOST" Form in Place?  No data      TOTAL TIME TAKING CARE OF THIS PATIENT: 40 minutes.    Theodoro Grist M.D on 04/20/2016 at 12:50 PM  Between 7am to 6pm - Pager - [phone]  After 6pm go to [email or web address] - password [email or web address] [facility]  Office  [phone]  CC: Primary care physician; Lelon Huh, MD

## 2016-04-20 NOTE — Evaluation (Signed)
Clinical/Bedside Swallow Evaluation Patient Details  Name: Tiffany Mclean MRN: [id number] Date of Birth: [birth year]  Today's Date: 04/20/2016 Time: SLP Start Time (ACUTE ONLY): 1025 SLP Stop Time (ACUTE ONLY): 1115 SLP Time Calculation (min) (ACUTE ONLY): 50 min  Past Medical History:  Past Medical History:  Diagnosis Date  . Cancer (HCC)    squamous cell, vaginal  . COPD (chronic obstructive pulmonary disease) ([place])   . Diabetes mellitus without complication ([place])   . Pneumonia    Past Surgical History:  Past Surgical History:  Procedure Laterality Date  . ABDOMINAL HYSTERECTOMY  1996   BSO. Dr. Ammie Dalton  . BREAST ENHANCEMENT SURGERY     rupture  . BREAST SURGERY  1983   Breast Augmentation  . [place]  . CHOLECYSTECTOMY  12/20/2010   Laparoscopic, Dr. Marina Gravel, [facility]  . EYE SURGERY     1977  . TUBAL LIGATION  1986   HPI:  Pt with a known hx of recurrent pna and decreased oral dentetion.   Assessment / Plan / Recommendation Clinical Impression  pt presents with a mild oral pharyngeal dysphagia characterized by increased mastication length, poor bolus formation and pt stating chewing is difficult with regular textures. pt complains of feelin foods and drinks upon swallow until they have reached stomach. pt states all food and drink get moved to R side for swallow. No immediate overt ssx aspiration noted with any texutures, however due to pt complaints, comfort level and report, slp recommends to downgrade to dys 2 with thin with no straw. pt may benefit from mbss to rule out further risk of aspiration.     Aspiration Risk  Mild aspiration risk    Diet Recommendation Dysphagia 2 (Fine chop);Thin liquid   Liquid Administration via: Cup;No straw Medication Administration: Crushed with puree Supervision: Patient able to self feed Compensations: Slow rate;Small sips/bites;Follow solids with liquid;Chin tuck Postural Changes: Seated upright at 90  degrees    Other  Recommendations Recommended Consults: Consider GI evaluation Oral Care Recommendations: Patient independent with oral care   Follow up Recommendations        Frequency and Duration min 3x week  2 weeks       Prognosis Prognosis for Safe Diet Advancement: Good Barriers to Reach Goals: Behavior Barriers/Prognosis Comment: pt anxiety may be a barrier to progress      Swallow Study   General Date of Onset: 04/20/16 HPI: Pt with a known hx of recurrent pna and decreased oral dentetion. Type of Study: Bedside Swallow Evaluation Diet Prior to this Study: Regular;Thin liquids Temperature Spikes Noted: No Respiratory Status: Room air History of Recent Intubation: No Behavior/Cognition: Alert;Cooperative;Pleasant mood Oral Cavity Assessment: Dry Oral Care Completed by SLP: No Oral Cavity - Dentition: Poor condition Vision: Functional for self-feeding Self-Feeding Abilities: Able to feed self Patient Positioning: Upright in bed Baseline Vocal Quality: Normal Volitional Cough: Strong Volitional Swallow: Able to elicit    Oral/Motor/Sensory Function Overall Oral Motor/Sensory Function: Within functional limits   Ice Chips Ice chips: Within functional limits Presentation: Self Fed;Spoon   Thin Liquid Thin Liquid: Within functional limits Presentation: Cup;Self Fed    Nectar Thick Nectar Thick Liquid: Not tested   Honey Thick Honey Thick Liquid: Not tested   Puree Puree: Within functional limits Presentation: Self Fed;Spoon   Solid   GO   Solid: Within functional limits Presentation: Self Fed;Spoon Other Comments: WFL for modified textures, pt states she has difficulty chewing and swallowing regular textures due  to dentition and states food gets stuck in pharyngeal space.         Stacie Harris Sauber 04/20/2016,11:25 AM

## 2016-04-21 DIAGNOSIS — J209 Acute bronchitis, unspecified: Secondary | ICD-10-CM

## 2016-04-21 DIAGNOSIS — J9621 Acute and chronic respiratory failure with hypoxia: Secondary | ICD-10-CM

## 2016-04-21 DIAGNOSIS — R1310 Dysphagia, unspecified: Secondary | ICD-10-CM

## 2016-04-21 DIAGNOSIS — J9622 Acute and chronic respiratory failure with hypercapnia: Secondary | ICD-10-CM

## 2016-04-21 LAB — GLUCOSE, CAPILLARY
GLUCOSE-CAPILLARY: 151 mg/dL — AB (ref 65–99)
Glucose-Capillary: 133 mg/dL — ABNORMAL HIGH (ref 65–99)

## 2016-04-21 MED ORDER — IPRATROPIUM-ALBUTEROL 0.5-2.5 (3) MG/3ML IN SOLN: 3.0000 mL | RESPIRATORY_TRACT | 12 refills | Status: AC | PRN

## 2016-04-21 MED ORDER — PREDNISONE 10 MG (21) PO TBPK: 10.0000 mg | ORAL_TABLET | Freq: Every day | ORAL | 0 refills | Status: DC

## 2016-04-21 MED ORDER — PANTOPRAZOLE SODIUM 40 MG PO TBEC: 40.0000 mg | DELAYED_RELEASE_TABLET | Freq: Every day | ORAL | 6 refills | Status: DC

## 2016-04-21 MED ORDER — GUAIFENESIN-DM 100-10 MG/5ML PO SYRP: 5.0000 mL | ORAL_SOLUTION | Freq: Four times a day (QID) | ORAL | 0 refills | Status: DC | PRN

## 2016-04-21 NOTE — Progress Notes (Signed)
Pt. Discharged to home w/ hh & her private o2 tank via wc. Discharge instructions and medication regimen reviewed at bedside with patient and husband. Both verbalize understanding of instructions and medication regimen. Prescription for prednisone included with d/c papers, all other prescriptions sent to pharmacy, pt and spouse aware. Patient assessment unchanged from this morning. TELE and IV discontinued per policy.

## 2016-04-21 NOTE — Care Management Note (Signed)
Case Management Note  Patient Details  Name: TRYSTAN LINNANE MRN: [id number] Date of Birth: 10/31/1950  Subjective/Objective:             Discussed discharge planning with Mrs Boye. Mrs Battley chose [facility] to be her [facility] provider. A referral for RN, PT, Aide, OT was called to [place] [facility]. Updated [facility] about Mrs Hubbards concerns that [facility] call her prior to visiting and that she wanted consistent caregivers. Also provided the cell number for her husband [name] [phone].       Action/Plan:   Expected Discharge Date:                  Expected Discharge Plan:     In-House Referral:     Discharge planning Services     Post Acute Care Choice:    Choice offered to:     DME Arranged:    DME Agency:     HH Arranged:    HH Agency:     Status of Service:     If discussed at [employer] of Stay Meetings, dates discussed:    Additional Comments:  Terriyah Westra A, RN 04/21/2016, 1:57 PM

## 2016-04-21 NOTE — Discharge Summary (Signed)
Tiffany Mclean    MR#:  [id number]  DATE OF BIRTH:  02-May-1951  DATE OF ADMISSION:  04/17/2016 ADMITTING PHYSICIAN: Laverle Hobby, MD  DATE OF DISCHARGE: No discharge date for patient encounter.  PRIMARY CARE PHYSICIAN: Lelon Huh, MD     ADMISSION DIAGNOSIS:  Shortness of breath [R06.02]  DISCHARGE DIAGNOSIS:  Active Problems:   COPD exacerbation (HCC)   Acute on chronic respiratory failure with hypoxia and hypercapnia (HCC)   Acute bronchitis   Dysphagia   SECONDARY DIAGNOSIS:   Past Medical History:  Diagnosis Date  . Cancer (HCC)    squamous cell, vaginal  . COPD (chronic obstructive pulmonary disease) ([place])   . Diabetes mellitus without complication ([place])   . Pneumonia     .pro HOSPITAL COURSE:   The patient is a 65 year old Caucasian female with medical history significant for history of COPD, former tobacco abuse, quit about 9 years ago, chronic respiratory failure on 2 L of oxygen through nasal cannula at home, who presents to the hospital with shortness of breath, O2 sats were low as 82% on 2 L of oxygen via nasal cannula. Chest x-ray revealed  hyperinflation. The patient was diagnosed with COPD exacerbation and admitted to the hospital for treatment with steroids, inhalation therapy, nebulizers. With conservative therapy her condition improved and she was felt to be stable to be discharged home. While in the hospital she admitted of pills/food getting stuck in esophagus, also regurgitation. She was seen by speech therapist and her diet was downgraded to dysphagia 2 diet, which was recommended follow-up on discharge home. She was also evaluated by GI physician, recommended barium swallowing with upper GI, which needs to be done as outpatient.  Discussion by problem: #1. Acute on chronic respiratory failure with hypoxia and hypercapnia due to COPD exacerbation, improved with  therapy, continue oxygen therapy, back on 2 L of oxygen with good O2 sats, now off BiPAP. O2 sats were 100% on 1 L of oxygen through nasal cannula today #2 COPD exacerbation, now off BiPAP, continue tapering steroids as outpatient, continue albuterol, DuoNeb,  Spiriva, patient is to follow-up with pulmonologist as outpatient #3. Acute bronchitis, sputum cultures were obtained, not reported by the day of discharge, patient did not receive antibiotic therapy during her hospitalization, since aspiration was implicated. It is recommended to follow patient's sputum culture results and initiate antibiotic therapy if needed #4. Diabetes mellitus type II, continue diabetic diet, sliding scale insulin while in the hospital #5 Dysphagia, the patient was seen by speech therapist and recommended dysphagia 2 diet, which patient was recommended to continue at home,  continue PPI, get Barium swallowing test with upper Gi as outpatient #6. Generalized weakness, home health physical therapist will be arranged upon discharge DISCHARGE CONDITIONS:   Stable  CONSULTS OBTAINED:  Treatment Team:  Erby Pian, MD  DRUG ALLERGIES:   Allergies  Allergen Reactions  . Asa [Aspirin]   . Avelox [Moxifloxacin]   . Cephalexin   . Codeine   . Erythromycin Ethylsuccinate   . Flagyl [Metronidazole]   . Latex   . Levaquin [Levofloxacin] Hives  . Meloxicam   . Shellfish Allergy Nausea And Vomiting and Other (See Comments)    Dizzy, fainting  . Sulfa Antibiotics   . Topiramate   . Doxycycline Rash  . Tetracyclines & Related Rash    DISCHARGE MEDICATIONS:   Current Discharge Medication List    START taking these medications  Details  guaiFENesin-dextromethorphan (ROBITUSSIN DM) 100-10 MG/5ML syrup Take 5 mLs by mouth every 6 (six) hours as needed for cough. Qty: 118 mL, Refills: 0    ipratropium-albuterol (DUONEB) 0.5-2.5 (3) MG/3ML SOLN Take 3 mLs by nebulization every 4 (four) hours as needed. Qty:  360 mL, Refills: 12    pantoprazole (PROTONIX) 40 MG tablet Take 1 tablet (40 mg total) by mouth daily. Qty: 30 tablet, Refills: 6    predniSONE (STERAPRED UNI-PAK 21 TAB) 10 MG (21) TBPK tablet Take 1 tablet (10 mg total) by mouth daily. Please take 6 pills in the morning on the day One, then taper by 1 pill every 2 days until finished, thanks Qty: 42 tablet, Refills: 0      CONTINUE these medications which have NOT CHANGED   Details  acetaminophen (RA ACETAMINOPHEN) 650 MG CR tablet Take 650 mg by mouth every 8 (eight) hours as needed for pain or fever.     albuterol (ACCUNEB) 1.25 MG/3ML nebulizer solution Take 1 ampule by nebulization 4 (four) times daily as needed for shortness of breath.    albuterol (PROVENTIL HFA;VENTOLIN HFA) 108 (90 Base) MCG/ACT inhaler Inhale 2 puffs into the lungs every 6 (six) hours as needed for wheezing or shortness of breath. Qty: 3 Inhaler, Refills: 3   Associated Diagnoses: Chronic respiratory failure with hypoxia (HCC)    ALPRAZolam (XANAX) 0.25 MG tablet Take 0.25 mg by mouth 2 (two) times daily as needed.    atorvastatin (LIPITOR) 40 MG tablet 1 (ONE) TABLET, ORAL, DAILY BY MOUTH Qty: 90 tablet, Refills: 4    DULoxetine (CYMBALTA) 30 MG capsule Take 3 capsules (90 mg total) by mouth daily. Qty: 270 capsule, Refills: 3   Associated Diagnoses: Major depressive disorder, recurrent, severe without psychotic features (HCC)    fluticasone (FLONASE) 50 MCG/ACT nasal spray Place 1 spray into both nostrils daily.    ipratropium (ATROVENT) 0.02 % nebulizer solution Take 0.5 mg by nebulization every 6 (six) hours as needed for wheezing or shortness of breath.    morphine (MSIR) 15 MG tablet Take 0.5 tablets (7.5 mg total) by mouth every 4 (four) hours as needed for moderate pain or severe pain ((4-6/10)). Qty: 100 tablet, Refills: 0   Associated Diagnoses: Chronic pain associated with significant psychosocial dysfunction    RABEprazole (ACIPHEX) 20 MG  tablet Take 1 tablet (20 mg total) by mouth daily. Qty: 90 tablet, Refills: 3    tiotropium (SPIRIVA HANDIHALER) 18 MCG inhalation capsule Place 1 capsule (18 mcg total) into inhaler and inhale daily. Qty: 90 capsule, Refills: 4   Associated Diagnoses: Pulmonary emphysema, unspecified emphysema type ([place])         DISCHARGE INSTRUCTIONS:    The patient is to follow-up with primary care physician as outpatient. Patient may benefit from gastroenterologist evaluation as outpatient for suspected esophageal dysmotility  If you experience worsening of your admission symptoms, develop shortness of breath, life threatening emergency, suicidal or homicidal thoughts you must seek medical attention immediately by calling 911 or calling your MD immediately  if symptoms less severe.  You Must read complete instructions/literature along with all the possible adverse reactions/side effects for all the Medicines you take and that have been prescribed to you. Take any new Medicines after you have completely understood and accept all the possible adverse reactions/side effects.   Please note  You were cared for by a hospitalist during your hospital stay. If you have any questions about your discharge medications or the care you received  while you were in the hospital after you are discharged, you can call the unit and asked to speak with the hospitalist on call if the hospitalist that took care of you is not available. Once you are discharged, your primary care physician will handle any further medical issues. Please note that NO REFILLS for any discharge medications will be authorized once you are discharged, as it is imperative that you return to your primary care physician (or establish a relationship with a primary care physician if you do not have one) for your aftercare needs so that they can reassess your need for medications and monitor your lab values.    Today   CHIEF COMPLAINT:   Chief  Complaint  Patient presents with  . Shortness of Breath    HISTORY OF PRESENT ILLNESS:  Tiffany Mclean  is a 65 y.o. female with a known history of COPD, former tobacco abuse, quit about 9 years ago, chronic respiratory failure on 2 L of oxygen through nasal cannula at home, who presents to the hospital with shortness of breath, O2 sats were low as 82% on 2 L of oxygen via nasal cannula. Chest x-ray revealed  hyperinflation. The patient was diagnosed with COPD exacerbation and admitted to the hospital for treatment with steroids, inhalation therapy, nebulizers. With conservative therapy her condition improved and she was felt to be stable to be discharged home. While in the hospital she admitted of pills/food getting stuck in esophagus, also regurgitation. She was seen by speech therapist and her diet was downgraded to dysphagia 2 diet, which was recommended follow-up on discharge home. She was also evaluated by GI physician, recommended barium swallowing with upper GI, which needs to be done as outpatient.  Discussion by problem: #1. Acute on chronic respiratory failure with hypoxia and hypercapnia due to COPD exacerbation, improved with therapy, continue oxygen therapy, back on 2 L of oxygen with good O2 sats, now off BiPAP. O2 sats were 100% on 1 L of oxygen through nasal cannula today #2 COPD exacerbation, now off BiPAP, continue tapering steroids as outpatient, continue albuterol, DuoNeb,  Spiriva, patient is to follow-up with pulmonologist as outpatient #3. Acute bronchitis, sputum cultures were obtained, not reported by the day of discharge, patient did not receive antibiotic therapy during her hospitalization, since aspiration was implicated. It is recommended to follow patient's sputum culture results and initiate antibiotic therapy if needed #4. Diabetes mellitus type II, continue diabetic diet, sliding scale insulin while in the hospital #5 Dysphagia, the patient was seen by speech  therapist and recommended dysphagia 2 diet, which patient was recommended to continue at home,  continue PPI, get Barium swallowing test with upper Gi as outpatient #6. Generalized weakness, home health physical therapist will be arranged upon discharge    VITAL SIGNS:  Blood pressure (!) 101/55, pulse 70, temperature 97.8 F (36.6 C), temperature source Oral, resp. rate 18, height 5\' 5"  (1.651 m), weight 61.7 kg (136 lb), SpO2 100 %.  I/O:   Intake/Output Summary (Last 24 hours) at 04/21/16 1326 Last data filed at 04/21/16 0900  Gross per 24 hour  Intake              960 ml  Output                0 ml  Net              960 ml    PHYSICAL EXAMINATION:  GENERAL:  65 y.o.-year-old patient lying in the bed  with no acute distress.  EYES: Pupils equal, round, reactive to light and accommodation. No scleral icterus. Extraocular muscles intact.  HEENT: Head atraumatic, normocephalic. Oropharynx and nasopharynx clear.  NECK:  Supple, no jugular venous distention. No thyroid enlargement, no tenderness.  LUNGS: Normal breath sounds bilaterally, no wheezing, rales,rhonchi or crepitation. No use of accessory muscles of respiration.  CARDIOVASCULAR: S1, S2 normal. No murmurs, rubs, or gallops.  ABDOMEN: Soft, non-tender, non-distended. Bowel sounds present. No organomegaly or mass.  EXTREMITIES: No pedal edema, cyanosis, or clubbing.  NEUROLOGIC: Cranial nerves II through XII are intact. Muscle strength 5/5 in all extremities. Sensation intact. Gait not checked.  PSYCHIATRIC: The patient is alert and oriented x 3.  SKIN: No obvious rash, lesion, or ulcer.   DATA REVIEW:   CBC  Recent Labs Lab 04/19/16 0357  WBC 8.5  HGB 10.2*  HCT 31.2*  PLT 175    Chemistries   Recent Labs Lab 04/18/16 0011 04/19/16 0357  NA 140 143  K 4.1 4.6  CL 92* 98*  CO2 44* 37*  GLUCOSE 125* 154*  BUN 23* 21*  CREATININE 0.53 0.60  CALCIUM 9.3 8.9  MG 3.4*  --   AST 29  --   ALT 24  --    ALKPHOS 90  --   BILITOT 0.9  --     Cardiac Enzymes  Recent Labs Lab 04/17/16 2022  TROPONINI <0.03    Microbiology Results  Results for orders placed or performed during the hospital encounter of 04/17/16  MRSA PCR Screening     Status: None   Collection Time: 04/18/16 12:35 AM  Result Value Ref Range Status   MRSA by PCR NEGATIVE NEGATIVE Final    Comment:        The GeneXpert MRSA Assay (FDA approved for NASAL specimens only), is one component of a comprehensive MRSA colonization surveillance program. It is not intended to diagnose MRSA infection nor to guide or monitor treatment for MRSA infections.     RADIOLOGY:  No results found.  EKG:   Orders placed or performed during the hospital encounter of 04/17/16  . ED EKG  . ED EKG  . EKG 12-Lead  . EKG 12-Lead      Management plans discussed with the patient, family and they are in agreement.  CODE STATUS:     Code Status Orders        Start     Ordered   04/17/16 2311  Full code  Continuous     04/17/16 2313    Code Status History    Date Active Date Inactive Code Status Order ID Comments User Context   This patient has a current code status but no historical code status.    Advance Directive Documentation   Gross Most Recent Value  Type of Advance Directive  Living will  Pre-existing out of facility DNR order (yellow form or pink MOST form)  No data  "MOST" Form in Place?  No data      TOTAL TIME TAKING CARE OF THIS PATIENT: 40 minutes.    Theodoro Grist M.D on 04/21/2016 at 1:26 PM  Between 7am to 6pm - Pager - [phone]  After 6pm go to [email or web address] - password [email or web address] [facility]  Office  [phone]  CC: Primary care physician; Lelon Huh, MD

## 2016-04-22 ENCOUNTER — Telehealth: Payer: Self-pay | Admitting: Family Medicine

## 2016-04-22 DIAGNOSIS — R1310 Dysphagia, unspecified: Secondary | ICD-10-CM

## 2016-04-22 NOTE — Telephone Encounter (Signed)
Pt's husband called saying his wife was in the hospital last week and released yesterday.  She has COPD.  She seen a GI or speech therapist they are not sure which and she told them she thought Saidy was getting food particles in her lungs. Pt's husbands says he thought they were going to do more test while she was at the hospital but they released her yesterday with out any test being done telling them to follow up with her primary doctor.  There are no appts available right now for her to come in.  Does she needs to see you or be referred directly to GI.  Please advise.  [phone], otr [phone]  Thanks, Con Memos

## 2016-04-23 ENCOUNTER — Telehealth: Payer: Self-pay | Admitting: Family Medicine

## 2016-04-23 NOTE — Telephone Encounter (Signed)
Pt is requesting the Rx ALPRAZolam (XANAX) 0.25 MG tablet increased due to having panic attacks.  [facility]  [phone]  Pt is requesting a Rx to help with having burning and pressure when voiding.  Pt is have frequency and lower back pain/MW

## 2016-04-23 NOTE — Telephone Encounter (Signed)
Patient needs modified barium swallow and upper GI for dysphagia.

## 2016-04-24 MED ORDER — ALPRAZOLAM 0.25 MG PO TABS: 0.2500 mg | ORAL_TABLET | Freq: Two times a day (BID) | ORAL | 3 refills | Status: DC | PRN

## 2016-04-24 MED ORDER — CIPROFLOXACIN HCL 500 MG PO TABS: 500.0000 mg | ORAL_TABLET | Freq: Two times a day (BID) | ORAL | 0 refills | Status: AC

## 2016-04-24 NOTE — Telephone Encounter (Signed)
Sounds like she has a UTI. Please call in Cipro and alprazolam. Need o.v. If sx not resolved by the time cipro is finished.

## 2016-04-25 ENCOUNTER — Telehealth: Payer: Self-pay

## 2016-04-25 ENCOUNTER — Other Ambulatory Visit: Payer: Self-pay | Admitting: Family Medicine

## 2016-04-25 DIAGNOSIS — G894 Chronic pain syndrome: Secondary | ICD-10-CM

## 2016-04-25 MED ORDER — NITROFURANTOIN MONOHYD MACRO 100 MG PO CAPS: 100.0000 mg | ORAL_CAPSULE | Freq: Two times a day (BID) | ORAL | 0 refills | Status: AC

## 2016-04-25 NOTE — Telephone Encounter (Signed)
[facility] because pt was prescribed Cipro yesterday, and is allergic to Cipro. Please advise. Renaldo Fiddler, CMA

## 2016-04-25 NOTE — Telephone Encounter (Signed)
Advised patient as below. Called in medications into the pharmacy.

## 2016-04-25 NOTE — Telephone Encounter (Signed)
Left message to call back  

## 2016-04-25 NOTE — Telephone Encounter (Signed)
Pt needs refill on the morphine 15mg .  Her husband would like to pick up tomorrow afternoon.  Her call back is [phone]  Thank sTeri

## 2016-04-26 MED ORDER — MORPHINE SULFATE 15 MG PO TABS: 7.5000 mg | ORAL_TABLET | ORAL | 0 refills | Status: DC | PRN

## 2016-04-29 ENCOUNTER — Telehealth: Payer: Self-pay | Admitting: Family Medicine

## 2016-04-29 NOTE — Telephone Encounter (Signed)
Please review-aa 

## 2016-04-29 NOTE — Telephone Encounter (Signed)
[facility] called needing a  verbal order for PT 3x weekly for 2 weeks and then 2x weekly for 3 weeks.  Please calll Aldren at [phone]  Thanks  Con Memos

## 2016-04-29 NOTE — Telephone Encounter (Signed)
Advised Aldren as below. KW

## 2016-04-29 NOTE — Telephone Encounter (Signed)
That's fine

## 2016-05-01 ENCOUNTER — Other Ambulatory Visit: Payer: Self-pay | Admitting: Family Medicine

## 2016-05-01 DIAGNOSIS — R1310 Dysphagia, unspecified: Secondary | ICD-10-CM

## 2016-05-03 ENCOUNTER — Telehealth: Payer: Self-pay

## 2016-05-03 ENCOUNTER — Ambulatory Visit
Admission: RE | Admit: 2016-05-03 | Discharge: 2016-05-03 | Disposition: A | Discharge status: Home / Self Care | Payer: BLUE CROSS/BLUE SHIELD | Source: Ambulatory Visit | Attending: Family Medicine | Admitting: Family Medicine

## 2016-05-03 DIAGNOSIS — K224 Dyskinesia of esophagus: Secondary | ICD-10-CM | POA: Insufficient documentation

## 2016-05-03 DIAGNOSIS — K219 Gastro-esophageal reflux disease without esophagitis: Secondary | ICD-10-CM | POA: Diagnosis not present

## 2016-05-03 DIAGNOSIS — R1310 Dysphagia, unspecified: Secondary | ICD-10-CM | POA: Diagnosis not present

## 2016-05-03 DIAGNOSIS — K449 Diaphragmatic hernia without obstruction or gangrene: Secondary | ICD-10-CM

## 2016-05-03 NOTE — Telephone Encounter (Signed)
-----   Message from Birdie Sons, MD sent at 05/03/2016 12:58 PM EDT ----- Ricka Burdock study shows severe reflux and hiatal hernia impairing mobility of esophagus. Needs referral to GI for further evaluation and treatment. Thanks

## 2016-05-06 ENCOUNTER — Telehealth: Payer: Self-pay | Admitting: Family Medicine

## 2016-05-06 NOTE — Telephone Encounter (Signed)
Please advise 

## 2016-05-06 NOTE — Telephone Encounter (Signed)
Tiffany Mclean was notified. Tiffany Mclean is going to call [employer] and see which medical supply company they approve of. Tiffany Mclean will give [place] a cb.

## 2016-05-06 NOTE — Telephone Encounter (Signed)
Pt's husband Tiffany Mclean stated that it has been difficult getting pt in and out of the house and would like to get orders for pt to get a ramp installed at the house. Please advise. Thanks TNP

## 2016-05-06 NOTE — Telephone Encounter (Signed)
Sounds like a good idea, I don't know who do give such an order to. Did he talk to home health about this or has been to medical supply store? This is a new one for me.

## 2016-05-14 DIAGNOSIS — J9611 Chronic respiratory failure with hypoxia: Secondary | ICD-10-CM | POA: Diagnosis not present

## 2016-05-14 DIAGNOSIS — J9612 Chronic respiratory failure with hypercapnia: Secondary | ICD-10-CM | POA: Diagnosis not present

## 2016-05-14 DIAGNOSIS — E119 Type 2 diabetes mellitus without complications: Secondary | ICD-10-CM | POA: Diagnosis not present

## 2016-05-14 DIAGNOSIS — J441 Chronic obstructive pulmonary disease with (acute) exacerbation: Secondary | ICD-10-CM | POA: Diagnosis not present

## 2016-05-15 ENCOUNTER — Telehealth: Payer: Self-pay | Admitting: Family Medicine

## 2016-05-15 NOTE — Telephone Encounter (Signed)
Verbal order given to [facility]

## 2016-05-15 NOTE — Telephone Encounter (Signed)
Alina with [facility] called to request a verbal order for pt to have a urine culture and urinalysis .  Per pt daughter pt is confused and having frequency to void.  CB#[phone]/MW

## 2016-05-15 NOTE — Telephone Encounter (Signed)
This is a Risk manager patient. He is out of the office this afternoon. Is it ok to give verbal order as requested below?

## 2016-05-15 NOTE — Telephone Encounter (Signed)
Pt called back at 5:00 pm 10/25.   Wanting too know if she needs to comein Friday to see Dr. Caryn Section.  Her[place]all back [phone]  Con Memos

## 2016-05-15 NOTE — Telephone Encounter (Signed)
Yes this is ok to give.

## 2016-05-16 NOTE — Telephone Encounter (Signed)
Please check with home health and see if they have results of urinalysis.

## 2016-05-16 NOTE — Telephone Encounter (Signed)
Please advise 

## 2016-05-16 NOTE — Telephone Encounter (Signed)
Called home health, line was busy. Will try again later. [phone]

## 2016-05-17 ENCOUNTER — Ambulatory Visit: Payer: BLUE CROSS/BLUE SHIELD | Attending: Family Medicine

## 2016-05-17 NOTE — Telephone Encounter (Signed)
LMOVM for Tiffany Mclean to fax results.

## 2016-05-21 NOTE — Telephone Encounter (Signed)
LMOVM for pt's daughter Raquel Sarna to return call. Can not get in touch with Alina from home health.

## 2016-05-24 NOTE — Telephone Encounter (Signed)
Spoke with Sharman Crate from home health in regards to your reply on 05/16/16.She states that they never got urinalysis on patient because they were informed by patients daughter Raquel Sarna that patient was started on Macrobid so they felt it was no need to test urine at that time. KW

## 2016-05-30 ENCOUNTER — Encounter: Payer: Self-pay | Admitting: Gastroenterology

## 2016-05-30 ENCOUNTER — Ambulatory Visit (INDEPENDENT_AMBULATORY_CARE_PROVIDER_SITE_OTHER): Payer: BLUE CROSS/BLUE SHIELD | Admitting: Gastroenterology

## 2016-05-30 VITALS — BP 117/65 | HR 86 | Temp 98.3°F | Ht 60.0 in | Wt 132.0 lb

## 2016-05-30 DIAGNOSIS — K219 Gastro-esophageal reflux disease without esophagitis: Secondary | ICD-10-CM | POA: Diagnosis not present

## 2016-05-30 DIAGNOSIS — K228 Other specified diseases of esophagus: Secondary | ICD-10-CM | POA: Diagnosis not present

## 2016-05-30 DIAGNOSIS — K2289 Other specified disease of esophagus: Secondary | ICD-10-CM

## 2016-05-30 DIAGNOSIS — K224 Dyskinesia of esophagus: Secondary | ICD-10-CM | POA: Diagnosis not present

## 2016-05-30 MED ORDER — BACLOFEN 10 MG PO TABS: 10.0000 mg | ORAL_TABLET | Freq: Three times a day (TID) | ORAL | 2 refills | Status: DC

## 2016-05-30 NOTE — Progress Notes (Signed)
Gastroenterology Consultation  Referring Provider:     Birdie Sons, MD Primary Care Physician:  Lelon Huh, MD Primary Gastroenterologist:  Dr. Allen Norris     Reason for Consultation:     GERD and esophageal dysmotility        HPI:   Tiffany Mclean is a 65 y.o. y/o female referred for consultation & management of GERD and esophageal dysmotility by Dr. Lelon Huh, MD.  This patient comes in today with a long-standing history of esophageal problems. The patient states she has had GI problems and she was a little girl. The patient had an upper GI series that showed esophageal spasms with presbyesophagus and reflux. She reports that she feels a tightness in her esophagus that feels to be the size of a grapefruit when she has the symptoms. The patient is on home oxygen for a history of emphysema. She also reports that she has some lower abdominal pain and a history of irritable bowel syndrome.  Past Medical History:  Diagnosis Date  . Cancer (HCC)    squamous cell, vaginal  . COPD (chronic obstructive pulmonary disease) ([place])   . Diabetes mellitus without complication ([place])   . Pneumonia     Past Surgical History:  Procedure Laterality Date  . ABDOMINAL HYSTERECTOMY  1996   BSO. Dr. Ammie Dalton  . BREAST ENHANCEMENT SURGERY     rupture  . BREAST SURGERY  1983   Breast Augmentation  . [place]  . CHOLECYSTECTOMY  12/20/2010   Laparoscopic, Dr. Marina Gravel, [facility]  . EYE SURGERY     1977  . TUBAL LIGATION  1986    Prior to Admission medications   Medication Sig Start Date End Date Taking? Authorizing Provider  acetaminophen (RA ACETAMINOPHEN) 650 MG CR tablet Take 650 mg by mouth every 8 (eight) hours as needed for pain or fever.    Yes Historical Provider, MD  albuterol (ACCUNEB) 1.25 MG/3ML nebulizer solution Take 1 ampule by nebulization 4 (four) times daily as needed for shortness of breath.   Yes Historical Provider, MD  albuterol (PROVENTIL  HFA;VENTOLIN HFA) 108 (90 Base) MCG/ACT inhaler Inhale 2 puffs into the lungs every 6 (six) hours as needed for wheezing or shortness of breath. 02/20/16  Yes Birdie Sons, MD  ALPRAZolam Duanne Moron) 0.25 MG tablet Take 1 tablet (0.25 mg total) by mouth 2 (two) times daily as needed. 04/24/16  Yes Birdie Sons, MD  atorvastatin (LIPITOR) 40 MG tablet 1 (ONE) TABLET, ORAL, DAILY BY MOUTH 07/05/15  Yes Birdie Sons, MD  DULoxetine (CYMBALTA) 30 MG capsule Take 3 capsules (90 mg total) by mouth daily. 05/23/15  Yes Birdie Sons, MD  fluticasone (FLONASE) 50 MCG/ACT nasal spray Place 1 spray into both nostrils daily.   Yes Historical Provider, MD  guaiFENesin-dextromethorphan (ROBITUSSIN DM) 100-10 MG/5ML syrup Take 5 mLs by mouth every 6 (six) hours as needed for cough. 04/21/16  Yes Theodoro Grist, MD  ipratropium (ATROVENT) 0.02 % nebulizer solution Take 0.5 mg by nebulization every 6 (six) hours as needed for wheezing or shortness of breath.   Yes Historical Provider, MD  ipratropium-albuterol (DUONEB) 0.5-2.5 (3) MG/3ML SOLN Take 3 mLs by nebulization every 4 (four) hours as needed. 04/21/16  Yes Theodoro Grist, MD  morphine (MSIR) 15 MG tablet Take 0.5 tablets (7.5 mg total) by mouth every 4 (four) hours as needed for moderate pain or severe pain. 04/26/16  Yes Birdie Sons, MD  pantoprazole (  PROTONIX) 40 MG tablet Take 1 tablet (40 mg total) by mouth daily. 04/22/16  Yes Theodoro Grist, MD  predniSONE (STERAPRED UNI-PAK 21 TAB) 10 MG (21) TBPK tablet Take 1 tablet (10 mg total) by mouth daily. Please take 6 pills in the morning on the day One, then taper by 1 pill every 2 days until finished, thanks 04/21/16  Yes Theodoro Grist, MD  RABEprazole (ACIPHEX) 20 MG tablet Take 1 tablet (20 mg total) by mouth daily. 05/23/15  Yes Birdie Sons, MD  tiotropium (SPIRIVA HANDIHALER) 18 MCG inhalation capsule Place 1 capsule (18 mcg total) into inhaler and inhale daily. 02/13/15  Yes Birdie Sons, MD    baclofen (LIORESAL) 10 MG tablet Take 1 tablet (10 mg total) by mouth 3 (three) times daily. 05/30/16   Lucilla Lame, MD    Family History  Problem Relation Age of Onset  . Gout Mother   . Hypertension Mother   . Arthritis Mother   . Stroke Mother   . Kidney disease Mother   . CAD Mother   . Emphysema Father   . Gout Father   . Hypertension Father   . Kidney disease Father   . Arthritis Father   . Ulcers Father      Social History  Substance Use Topics  . Smoking status: Former Smoker    Packs/day: 1.50    Years: 30.00    Types: Cigarettes    Quit date: 11/21/2008  . Smokeless tobacco: Former Systems developer    Quit date: 07/22/2008  . Alcohol use No    Allergies as of 05/30/2016 - Review Complete 05/30/2016  Allergen Reaction Noted  . Asa [aspirin]  11/18/2014  . Avelox [moxifloxacin]  11/18/2014  . Cephalexin  11/18/2014  . Codeine  11/18/2014  . Erythromycin ethylsuccinate  11/18/2014  . Flagyl [metronidazole]  11/18/2014  . Latex  11/18/2014  . Levaquin [levofloxacin] Hives 11/18/2014  . Meloxicam  11/18/2014  . Shellfish allergy Nausea And Vomiting and Other (See Comments) 11/18/2014  . Sulfa antibiotics  11/18/2014  . Topiramate  11/18/2014  . Doxycycline Rash 02/13/2015  . Tetracyclines & related Rash 11/18/2014    Review of Systems:    All systems reviewed and negative except where noted in HPI.   Physical Exam:  BP 117/65   Pulse 86   Temp 98.3 F (36.8 C) (Oral)   Ht 5' (1.524 m)   Wt 132 lb (59.9 kg)   BMI 25.78 kg/m  No LMP recorded. Patient has had a hysterectomy. Psych:  Alert and cooperative. Normal mood and affect. General:   Alert,  Well-developed, well-nourished, pleasant and cooperative in NAD Head:  Normocephalic and atraumatic. Eyes:  Sclera clear, no icterus.   Conjunctiva pink. Ears:  Normal auditory acuity. Nose:  No deformity, discharge, or lesions. Mouth:  No deformity or lesions,oropharynx pink & moist. Neck:  Supple; no masses or  thyromegaly. Lungs:  Respirations even and labored.  Clear throughout to auscultation.   Positive rhonchi. No acute distress. Heart:  Regular rate and rhythm; no murmurs, clicks, rubs, or gallops. Abdomen:  Normal bowel sounds.  No bruits.  Soft, non-tender and non-distended without masses, hepatosplenomegaly or hernias noted.  No guarding or rebound tenderness.  Negative Carnett sign.   Rectal:  Deferred.  Msk:  Symmetrical without gross deformities.  Good, equal movement & strength bilaterally. Pulses:  Normal pulses noted. Extremities:  No clubbing or edema.  No cyanosis. Neurologic:  Alert and oriented x3;  grossly normal neurologically.  Skin:  Intact without significant lesions or rashes.  No jaundice. Lymph Nodes:  No significant cervical adenopathy. Psych:  Alert and cooperative. Normal mood and affect.  Imaging Studies: Dg Ugi  W/kub  Result Date: 05/03/2016 CLINICAL DATA:  Intermittent dysphagia to solids and liquids and tablets. History of previous esophageal dilation. Chronic gastroesophageal reflux. EXAM: UPPER GI SERIES WITHOUT KUB TECHNIQUE: Routine upper GI series was performed with thin barium. Thick barium and effervescent crystals were not administered. A barium tablet was administered. For a youth FLUOROSCOPY TIME:  Fluoroscopy Time:  1 minutes, 42 seconds Radiation Exposure Index (if provided by the fluoroscopic device): 1013.61 uGym2 Number of Acquired Spot Images: 5+ 3 cine loops COMPARISON:  CT scan of the chest, abdomen, and pelvis of Dec 19, 2010 FINDINGS: The study was performed in the recumbent and semi recumbent positions due to the patient's clinical condition. The patient ingested the thin barium without significant difficulty. There was no laryngeal penetration of the barium. The thoracic esophagus distended well. There was significant esophageal spasm. There was disordered peristalsis observed intermittently with portions of the barium bolus proceeding distally while  some was refluxed. The barium tablet passed into the mid esophagus and then gradually to the distal esophagus and ultimately passed into the stomach. The stomach distended reasonably well. The gastric mucosal pattern was grossly normal. No ulcer niche was observed. Gastric emptying was prompt. The duodenal bulb and visualized portions of the C-loop were normal. IMPRESSION: Limited study due to the patient's clinical condition. 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 Reflux of esophageal barium toward the hypopharynx was observed due disordered peristalsis. No fixed stricture was observed. There was no hiatal hernia. Normal appearance of the stomach and duodenum for the limited nature of the study. Electronically Signed   By: David  Martinique M.D.   On: 05/03/2016 12:38    Assessment and Plan:   Tiffany Mclean is a 65 y.o. y/o female comes in today with a history of heartburn that was well controlled under AcipHex but the patient states she was switched to Protonix which made her heartburn worse. The patient is now back on AcipHex and states her heartburn is under control. The patient does have intermittent spasms of her esophagus which caused her to have severe epigastric pain. The patient had a barium swallow that showed reflux with presbyesophagus and spasms. The patient has been told not to eat for 4 hours before she goes to bed and to avoid foods that promote reflux. The patient has also been started on a trial of baclofen for her esophageal spasms to see if that helps her symptoms. The patient has been explained her disease and we'll were trying to improve and what cannot be improved. The patient and her husband state they had understand the plan and agree with it.    Lucilla Lame, MD. [facility]   Note: This dictation was prepared with Dragon dictation along with smaller phrase technology. Any transcriptional errors that result from this process are unintentional.

## 2016-06-12 ENCOUNTER — Other Ambulatory Visit: Payer: Self-pay

## 2016-06-12 ENCOUNTER — Telehealth: Payer: Self-pay

## 2016-06-12 DIAGNOSIS — N3000 Acute cystitis without hematuria: Secondary | ICD-10-CM

## 2016-06-12 NOTE — Telephone Encounter (Signed)
Christie from [facility] called stating she was at the patients home and wanted to check patients urine due to patient having more confusion and low grade temperature of 99.0. I consulted with Dr. Caryn Section regarding this request and he gave the "ok" to check urine. Adonis Brook was notified and given a verbal order to check urine.

## 2016-06-14 LAB — URINALYSIS
Bilirubin, UA: NEGATIVE
GLUCOSE, UA: NEGATIVE
Ketones, UA: NEGATIVE
LEUKOCYTES UA: NEGATIVE
NITRITE UA: NEGATIVE
Protein, UA: NEGATIVE
RBC, UA: NEGATIVE
Specific Gravity, UA: 1.017 (ref 1.005–1.030)
UUROB: 1 mg/dL (ref 0.2–1.0)
pH, UA: 6.5 (ref 5.0–7.5)

## 2016-06-14 LAB — URINE CULTURE

## 2016-06-17 ENCOUNTER — Telehealth: Payer: Self-pay

## 2016-06-17 NOTE — Telephone Encounter (Signed)
[id number] 

## 2016-06-17 NOTE — Telephone Encounter (Signed)
-----   Message from Birdie Sons, MD sent at 06/14/2016 12:13 PM EST ----- Urinalysis and culture are negative. No sign of infection.

## 2016-06-18 NOTE — Telephone Encounter (Signed)
Advised patient of lab report and culture she states that she has been hurting all over her body, she states that her bones her and the pain she feels over her body is described as burning sensation. Patient complains of the following symptoms: fever, headache, fatigue that is causing her to fall asleep during the day, congestion and incontinence. Patient states that since culture was negative she would like to know what to do next since she is still having symptoms? KW

## 2016-06-18 NOTE — Telephone Encounter (Signed)
Patient was very tearful on the phone she states " I cant handle this anymore." Patient reports that she is on Xanax 0.25mg  and that medication is not helping her she states that she is to take medication twice a day and it has not helped. She reports that she is under tremendous stress and in pain constantly. She reports that she is doing PT and OT at home but has difficulty moving now due to pain. Patient reports that she has been under emotional stress at home and has been crying spells daily. Patient wants to know if you can increase her Xanax dosage? I scheduled patients appt with you on 06/21/16, patient states that her husband has to give his job two days notice and she has no other ride to office. Please review chart and advise. KW

## 2016-06-18 NOTE — Telephone Encounter (Signed)
Will need office visit for evaluation

## 2016-06-21 ENCOUNTER — Ambulatory Visit (INDEPENDENT_AMBULATORY_CARE_PROVIDER_SITE_OTHER): Payer: BLUE CROSS/BLUE SHIELD | Admitting: Family Medicine

## 2016-06-21 ENCOUNTER — Encounter: Payer: Self-pay | Admitting: Family Medicine

## 2016-06-21 VITALS — BP 122/72 | HR 107 | Temp 98.1°F | Resp 20

## 2016-06-21 DIAGNOSIS — R739 Hyperglycemia, unspecified: Secondary | ICD-10-CM

## 2016-06-21 DIAGNOSIS — Z23 Encounter for immunization: Secondary | ICD-10-CM | POA: Diagnosis not present

## 2016-06-21 DIAGNOSIS — J9611 Chronic respiratory failure with hypoxia: Secondary | ICD-10-CM | POA: Diagnosis not present

## 2016-06-21 DIAGNOSIS — R5383 Other fatigue: Secondary | ICD-10-CM

## 2016-06-21 DIAGNOSIS — F419 Anxiety disorder, unspecified: Secondary | ICD-10-CM | POA: Diagnosis not present

## 2016-06-21 DIAGNOSIS — D509 Iron deficiency anemia, unspecified: Secondary | ICD-10-CM | POA: Diagnosis not present

## 2016-06-21 DIAGNOSIS — H6123 Impacted cerumen, bilateral: Secondary | ICD-10-CM | POA: Diagnosis not present

## 2016-06-21 DIAGNOSIS — K219 Gastro-esophageal reflux disease without esophagitis: Secondary | ICD-10-CM

## 2016-06-21 MED ORDER — ALPRAZOLAM 0.25 MG PO TABS: 0.2500 mg | ORAL_TABLET | Freq: Four times a day (QID) | ORAL | 5 refills | Status: DC | PRN

## 2016-06-21 MED ORDER — BACLOFEN 10 MG PO TABS: 10.0000 mg | ORAL_TABLET | Freq: Three times a day (TID) | ORAL | 5 refills | Status: DC

## 2016-06-21 NOTE — Patient Instructions (Signed)
Call [facility] as soon as possible to reschedule your appointment with Dr. Raul Del

## 2016-06-21 NOTE — Progress Notes (Signed)
Patient: Tiffany Mclean Female    DOB: 1950/10/03   65 y.o.   MRN: [id number] Visit Date: 06/21/2016  Today's Provider: Lelon Huh, MD   Chief Complaint  Patient presents with  . Fatigue  . Anxiety  . Gastroesophageal Reflux  . COPD  . Hearing Problem   Subjective:    HPI Fatigue:  Patient comes in today reporting that she has had no energy for the past couple of weeks. Patient states she feels drained and doesn't want to do anything. She has also had body aches all over. Patient states her hearing has decreased in the past few months. States she feels terrible when she wakes up in the morning, but improves once she gets up and around. States she feels very anxious and alprazolam Is not working as well. She is currently taking 0.25mg  twice a day.  Is working with home PT  Follow up severe COPD Is using Ventolin inhaler  Several times a day. States she is still using Spiriva every day. Has nebulizer but doesn't use very often. She had been followed by Dr. Raul Del, but she missed her last appointment and has not yet rescheduled.   Follow GERD Saw Dr. Allen Norris about a month ago for severe GERD and dysphagia after hospitalization in September. She was continued on Aciphex  and added Baclofen for esophageal spasm. She states it has helped a lot, but prescriptions are only last 10 days, and she is requesting long term prescription  Anxiety She states she continues to feel very anxious and is taking alprazolam 0.25 twice a day. He has many days when she feels nearly incapacitated by anxiety and panic attacks and needs to take an additional one or two alprazolam.  She also complains that she feels like her ears are always congested and her husband states her hearing is terrible. She denies any ear pain or fever.       Allergies  Allergen Reactions  . Asa [Aspirin]   . Avelox [Moxifloxacin]   . Cephalexin   . Codeine   . Erythromycin Ethylsuccinate   . Flagyl  [Metronidazole]   . Latex   . Levaquin [Levofloxacin] Hives  . Meloxicam   . Shellfish Allergy Nausea And Vomiting and Other (See Comments)    Dizzy, fainting  . Sulfa Antibiotics   . Topiramate   . Doxycycline Rash  . Tetracyclines & Related Rash     Current Outpatient Prescriptions:  .  acetaminophen (RA ACETAMINOPHEN) 650 MG CR tablet, Take 650 mg by mouth every 8 (eight) hours as needed for pain or fever. , Disp: , Rfl:  .  albuterol (ACCUNEB) 1.25 MG/3ML nebulizer solution, Take 1 ampule by nebulization 4 (four) times daily as needed for shortness of breath., Disp: , Rfl:  .  albuterol (PROVENTIL HFA;VENTOLIN HFA) 108 (90 Base) MCG/ACT inhaler, Inhale 2 puffs into the lungs every 6 (six) hours as needed for wheezing or shortness of breath., Disp: 3 Inhaler, Rfl: 3 .  ALPRAZolam (XANAX) 0.25 MG tablet, Take 1 tablet (0.25 mg total) by mouth 2 (two) times daily as needed., Disp: 60 tablet, Rfl: 3 .  atorvastatin (LIPITOR) 40 MG tablet, 1 (ONE) TABLET, ORAL, DAILY BY MOUTH, Disp: 90 tablet, Rfl: 4 .  baclofen (LIORESAL) 10 MG tablet, Take 1 tablet (10 mg total) by mouth 3 (three) times daily., Disp: 30 each, Rfl: 2 .  DULoxetine (CYMBALTA) 30 MG capsule, Take 3 capsules (90 mg total) by mouth daily., Disp: 270  capsule, Rfl: 3 .  fluticasone (FLONASE) 50 MCG/ACT nasal spray, Place 1 spray into both nostrils daily., Disp: , Rfl:  .  guaiFENesin-dextromethorphan (ROBITUSSIN DM) 100-10 MG/5ML syrup, Take 5 mLs by mouth every 6 (six) hours as needed for cough., Disp: 118 mL, Rfl: 0 .  ipratropium (ATROVENT) 0.02 % nebulizer solution, Take 0.5 mg by nebulization every 6 (six) hours as needed for wheezing or shortness of breath., Disp: , Rfl:  .  ipratropium-albuterol (DUONEB) 0.5-2.5 (3) MG/3ML SOLN, Take 3 mLs by nebulization every 4 (four) hours as needed., Disp: 360 mL, Rfl: 12 .  morphine (MSIR) 15 MG tablet, Take 0.5 tablets (7.5 mg total) by mouth every 4 (four) hours as needed for  moderate pain or severe pain., Disp: 100 tablet, Rfl: 0 .  RABEprazole (ACIPHEX) 20 MG tablet, Take 1 tablet (20 mg total) by mouth daily., Disp: 90 tablet, Rfl: 3 .  tiotropium (SPIRIVA HANDIHALER) 18 MCG inhalation capsule, Place 1 capsule (18 mcg total) into inhaler and inhale daily., Disp: 90 capsule, Rfl: 4  Review of Systems  Constitutional: Positive for fatigue. Negative for appetite change, chills and fever.  HENT: Positive for hearing loss (decrease in hearing).   Respiratory: Positive for shortness of breath. Negative for chest tightness.   Cardiovascular: Negative for chest pain and palpitations.  Gastrointestinal: Positive for abdominal pain and nausea. Negative for vomiting.  Musculoskeletal: Positive for myalgias.  Neurological: Positive for weakness and headaches. Negative for dizziness.    Social History  Substance Use Topics  . Smoking status: Former Smoker    Packs/day: 1.50    Years: 30.00    Types: Cigarettes    Quit date: 11/21/2008  . Smokeless tobacco: Former Systems developer    Quit date: 07/22/2008  . Alcohol use No   Objective:   BP 122/72 (BP Location: Left Arm, Patient Position: Sitting, Cuff Size: Normal)   Pulse (!) 107   Temp 98.1 F (36.7 C) (Oral)   Resp 20   SpO2 95% Comment: 3 L 02 Nasal canual  Physical Exam  General Appearance:    Alert, cooperative, no distress  HENT:   neck without nodes, throat normal without erythema or exudate and sinuses nontender. Both ear canals obstructed with cerumen.   Eyes:    PERRL, conjunctiva/corneas clear, EOM's intact       Lungs:     Clear to auscultation bilaterally, respirations unlabored, Faint shallow breath sounds.   Heart:    Regular rate and rhythm  Neurologic:   Awake, alert, oriented x 3. No apparent focal neurological           defect.           Assessment & Plan:     1. Fatigue, unspecified type  - Comprehensive metabolic panel - CBC - T4 AND TSH  2. Chronic respiratory failure with hypoxia  (HCC) Counseled strongly on importance of follow up with Dr. Raul Del and to continue current inhalers until seen  3. Gastroesophageal reflux disease, esophagitis presence not specified Doing better since Dr. Allen Norris added baclofen to Aciphex. Given long term prescription today.  - baclofen (LIORESAL) 10 MG tablet; Take 1 tablet (10 mg total) by mouth 3 (three) times daily.  Dispense: 90 each; Refill: 5  4. Anxiety Worsening. May increase alprazolam from BID to up to four times daily.  - ALPRAZolam (XANAX) 0.25 MG tablet; Take 1 tablet (0.25 mg total) by mouth every 6 (six) hours as needed.  Dispense: 120 tablet; Refill: 5  5.  Iron deficiency anemia, unspecified iron deficiency anemia type  - Ferritin  6. Hyperglycemia Was noted to have persistenlly elevated blood sugars at recent hospitliazatiion.  - Hemoglobin A1c  7. Need for influenza vaccination  - Flu vaccine HIGH DOSE PF  8. Need for pneumococcal vaccination  - Pneumococcal conjugate vaccine 13-valent IM  9. Bilateral impacted cerumen After soaking with Debrox, ear canals were irrigated with water until clear. Patient tolerated procedure well.    Addressed extensive list of chronic and acute medical problems today requiring extensive time in counseling and coordination care.  Over half of this 45 minute visit were spent in counseling and coordinating care of multiple medical problems.       Lelon Huh, MD  [facility]

## 2016-06-22 LAB — COMPREHENSIVE METABOLIC PANEL
A/G RATIO: 1.3 (ref 1.2–2.2)
ALBUMIN: 3.8 g/dL (ref 3.6–4.8)
ALK PHOS: 90 IU/L (ref 39–117)
ALT: 19 IU/L (ref 0–32)
AST: 23 IU/L (ref 0–40)
BILIRUBIN TOTAL: 0.3 mg/dL (ref 0.0–1.2)
BUN / CREAT RATIO: 35 — AB (ref 12–28)
BUN: 21 mg/dL (ref 8–27)
CHLORIDE: 96 mmol/L (ref 96–106)
CO2: 33 mmol/L — ABNORMAL HIGH (ref 18–29)
Calcium: 9.8 mg/dL (ref 8.7–10.3)
Creatinine, Ser: 0.6 mg/dL (ref 0.57–1.00)
GFR calc non Af Amer: 95 mL/min/{1.73_m2} (ref >59)
GFR, EST AFRICAN AMERICAN: 110 mL/min/{1.73_m2} (ref >59)
GLOBULIN, TOTAL: 2.9 g/dL (ref 1.5–4.5)
GLUCOSE: 119 mg/dL — AB (ref 65–99)
POTASSIUM: 5.3 mmol/L — AB (ref 3.5–5.2)
SODIUM: 145 mmol/L — AB (ref 134–144)
Total Protein: 6.7 g/dL (ref 6.0–8.5)

## 2016-06-22 LAB — CBC
Hematocrit: 34.4 % (ref 34.0–46.6)
Hemoglobin: 10.7 g/dL — ABNORMAL LOW (ref 11.1–15.9)
MCH: 27.4 pg (ref 26.6–33.0)
MCHC: 31.1 g/dL — AB (ref 31.5–35.7)
MCV: 88 fL (ref 79–97)
PLATELETS: 341 10*3/uL (ref 150–379)
RBC: 3.9 x10E6/uL (ref 3.77–5.28)
RDW: 13.5 % (ref 12.3–15.4)
WBC: 8.7 10*3/uL (ref 3.4–10.8)

## 2016-06-22 LAB — HEMOGLOBIN A1C
Est. average glucose Bld gHb Est-mCnc: 111 mg/dL
HEMOGLOBIN A1C: 5.5 % (ref 4.8–5.6)

## 2016-06-22 LAB — T4 AND TSH
T4 TOTAL: 6.7 ug/dL (ref 4.5–12.0)
TSH: 3.61 u[IU]/mL (ref 0.450–4.500)

## 2016-06-22 LAB — FERRITIN: Ferritin: 114 ng/mL (ref 15–150)

## 2016-06-24 ENCOUNTER — Telehealth: Payer: Self-pay

## 2016-06-24 NOTE — Telephone Encounter (Signed)
Left message to call back  

## 2016-06-24 NOTE — Telephone Encounter (Signed)
-----   Message from Birdie Sons, MD sent at 06/24/2016  8:07 AM EST ----- BUN and potassium levels are high, which indicates she is dehydrated and need to drink more water. Is slightly anemic, but stable. Otherwise labs are normal. Continue current medications follow up 6 weeks since we increased alprazolam at last office. Be sure to schedule follow up with pulmonologist for COPD.

## 2016-06-26 NOTE — Telephone Encounter (Signed)
Patient was notified of results.  

## 2016-06-26 NOTE — Telephone Encounter (Signed)
Patient had stated that she was unable to pick-up her alprazolam rx from the pharmacy. Called pharmacy who stated that it was to soon for rx to be filled. Patient can get rx on 06/29/16.

## 2016-07-02 NOTE — Telephone Encounter (Signed)
Was seen 06-21-2016

## 2016-07-05 ENCOUNTER — Telehealth: Payer: Self-pay | Admitting: Family Medicine

## 2016-07-05 DIAGNOSIS — G894 Chronic pain syndrome: Secondary | ICD-10-CM

## 2016-07-05 MED ORDER — MORPHINE SULFATE 15 MG PO TABS: 7.5000 mg | ORAL_TABLET | ORAL | 0 refills | Status: DC | PRN

## 2016-07-05 NOTE — Telephone Encounter (Signed)
Please review-aa 

## 2016-07-05 NOTE — Telephone Encounter (Signed)
Aldrian with [facility] would like verbal orders for in home physical therapy twice a week for 4 weeks. Please advise. Thanks TNP

## 2016-07-05 NOTE — Telephone Encounter (Signed)
Pt contacted office for refill request on the following medications: morphine (MSIR) 15 MG tablet  Last Rx: 04/26/16 Please advise. Thanks TNP

## 2016-07-05 NOTE — Telephone Encounter (Signed)
OK 

## 2016-07-05 NOTE — Telephone Encounter (Signed)
Tiffany Mclean

## 2016-07-26 ENCOUNTER — Telehealth: Payer: Self-pay | Admitting: Emergency Medicine

## 2016-07-26 NOTE — Telephone Encounter (Signed)
Tiffany Mclean with [facility] called she was at pt's home doing her PT and pt told her she was having trouble catching her breath, tired all the time, achy all over, sore ribs with little coughing. No other symptoms. She is on 3l of ox and her ox stats are 98%.  She would like to get orders for home Health ([facility]) to come to her home and evaluate her to see if she needs to come in. It is hard for pt get in and out of her home and difficult for her to walk. So this is why she wanting this ordered. She reports that if she can get these orders today she can get a nurse out there Monday morning.  Please advise.  Fax orders to [phone] attn Tracie Harrier.   [name] CB# [phone]

## 2016-07-26 NOTE — Telephone Encounter (Signed)
OK for home health referral.

## 2016-07-26 NOTE — Telephone Encounter (Signed)
orders faxed

## 2016-07-26 NOTE — Telephone Encounter (Signed)
Line busy. Will try faxing again later. Renaldo Fiddler, CMA

## 2016-07-31 ENCOUNTER — Telehealth: Payer: Self-pay | Admitting: Family Medicine

## 2016-07-31 NOTE — Telephone Encounter (Signed)
Aldrin with [facility] called to requesting a verbal order for pt to continue home health Physical Therapy 2 time a week for 4 weeks.  [id number]

## 2016-07-31 NOTE — Telephone Encounter (Signed)
Unable to reach Aldrin at this time, will try again later. VM full.

## 2016-07-31 NOTE — Telephone Encounter (Signed)
OK 

## 2016-08-02 NOTE — Telephone Encounter (Signed)
Unable to reach Aldrin at this time, will try again later. VM full.

## 2016-08-05 NOTE — Telephone Encounter (Signed)
Aldrin advised-aa 

## 2016-08-19 ENCOUNTER — Ambulatory Visit (INDEPENDENT_AMBULATORY_CARE_PROVIDER_SITE_OTHER): Payer: BLUE CROSS/BLUE SHIELD | Admitting: Family Medicine

## 2016-08-19 ENCOUNTER — Ambulatory Visit: Payer: Self-pay | Admitting: Family Medicine

## 2016-08-19 ENCOUNTER — Encounter: Payer: Self-pay | Admitting: Family Medicine

## 2016-08-19 VITALS — BP 110/72 | HR 93 | Temp 99.3°F | Resp 20

## 2016-08-19 DIAGNOSIS — J441 Chronic obstructive pulmonary disease with (acute) exacerbation: Secondary | ICD-10-CM

## 2016-08-19 DIAGNOSIS — J439 Emphysema, unspecified: Secondary | ICD-10-CM

## 2016-08-19 DIAGNOSIS — F419 Anxiety disorder, unspecified: Secondary | ICD-10-CM | POA: Diagnosis not present

## 2016-08-19 DIAGNOSIS — G894 Chronic pain syndrome: Secondary | ICD-10-CM

## 2016-08-19 MED ORDER — AMOXICILLIN 500 MG PO CAPS: 500.0000 mg | ORAL_CAPSULE | Freq: Three times a day (TID) | ORAL | 0 refills | Status: DC

## 2016-08-19 MED ORDER — MONTELUKAST SODIUM 10 MG PO TABS: 10.0000 mg | ORAL_TABLET | Freq: Every day | ORAL | 3 refills | Status: DC

## 2016-08-19 MED ORDER — TIOTROPIUM BROMIDE MONOHYDRATE 18 MCG IN CAPS
18.0000 ug | ORAL_CAPSULE | Freq: Every day | RESPIRATORY_TRACT | 4 refills | Status: DC
Start: 2016-08-19 — End: 2016-12-25

## 2016-08-19 MED ORDER — MORPHINE SULFATE ER 40 MG PO CP24: 40.0000 mg | ORAL_CAPSULE | Freq: Every day | ORAL | 0 refills | Status: DC

## 2016-08-19 MED ORDER — PREDNISONE 10 MG PO TABS: ORAL_TABLET | ORAL | 0 refills | Status: AC

## 2016-08-19 MED ORDER — HYDROCODONE-HOMATROPINE 5-1.5 MG/5ML PO SYRP: 5.0000 mL | ORAL_SOLUTION | Freq: Three times a day (TID) | ORAL | 0 refills | Status: DC | PRN

## 2016-08-19 MED ORDER — TIOTROPIUM BROMIDE MONOHYDRATE 18 MCG IN CAPS: 18.0000 ug | ORAL_CAPSULE | Freq: Every day | RESPIRATORY_TRACT | 4 refills | Status: DC

## 2016-08-19 NOTE — Progress Notes (Signed)
Patient: Tiffany Mclean Female    DOB: Jul 01, 1951   66 y.o.   MRN: [id number] Visit Date: 08/19/2016  Today's Provider: Lelon Huh, MD   Chief Complaint  Patient presents with  . Cough    x 2 days  . Fatigue   Subjective:    Cough  This is a new problem. Episode onset: 2 days ago. The problem has been gradually worsening. The cough is non-productive (occasionally has some yellow green phlegm come up). Associated symptoms include chest pain, chills, ear congestion, headaches, hemoptysis, myalgias, nasal congestion, postnasal drip, rhinorrhea, shortness of breath, sweats and wheezing. Pertinent negatives include no ear pain, fever or sore throat. She has tried OTC inhaler (prescription Inhalers and nebulizers ) for the symptoms. The treatment provided mild relief.  she has severe COPD and reports she is still using Spiriva every day  Pain Patient has long history of fibromyalgia on 1/2 of 15mg  morphine sulfate which she takes every four hours scheduled. She reports pain has been worse the last couple of months of often flares before she is due for the next dose of her medication.     Allergies  Allergen Reactions  . Asa [Aspirin]   . Avelox [Moxifloxacin]   . Cephalexin   . Codeine   . Erythromycin Ethylsuccinate   . Flagyl [Metronidazole]   . Latex   . Levaquin [Levofloxacin] Hives  . Meloxicam   . Shellfish Allergy Nausea And Vomiting and Other (See Comments)    Dizzy, fainting  . Sulfa Antibiotics   . Topiramate   . Doxycycline Rash  . Tetracyclines & Related Rash     Current Outpatient Prescriptions:  .  acetaminophen (RA ACETAMINOPHEN) 650 MG CR tablet, Take 650 mg by mouth every 8 (eight) hours as needed for pain or fever. , Disp: , Rfl:  .  albuterol (ACCUNEB) 1.25 MG/3ML nebulizer solution, Take 1 ampule by nebulization 4 (four) times daily as needed for shortness of breath., Disp: , Rfl:  .  albuterol (PROVENTIL HFA;VENTOLIN HFA) 108 (90 Base)  MCG/ACT inhaler, Inhale 2 puffs into the lungs every 6 (six) hours as needed for wheezing or shortness of breath., Disp: 3 Inhaler, Rfl: 3 .  ALPRAZolam (XANAX) 0.25 MG tablet, Take 1 tablet (0.25 mg total) by mouth every 6 (six) hours as needed., Disp: 120 tablet, Rfl: 5 .  atorvastatin (LIPITOR) 40 MG tablet, 1 (ONE) TABLET, ORAL, DAILY BY MOUTH, Disp: 90 tablet, Rfl: 4 .  baclofen (LIORESAL) 10 MG tablet, Take 1 tablet (10 mg total) by mouth 3 (three) times daily., Disp: 90 each, Rfl: 5 .  DULoxetine (CYMBALTA) 30 MG capsule, Take 3 capsules (90 mg total) by mouth daily., Disp: 270 capsule, Rfl: 3 .  fluticasone (FLONASE) 50 MCG/ACT nasal spray, Place 1 spray into both nostrils daily., Disp: , Rfl:  .  guaiFENesin-dextromethorphan (ROBITUSSIN DM) 100-10 MG/5ML syrup, Take 5 mLs by mouth every 6 (six) hours as needed for cough., Disp: 118 mL, Rfl: 0 .  ipratropium (ATROVENT) 0.02 % nebulizer solution, Take 0.5 mg by nebulization every 6 (six) hours as needed for wheezing or shortness of breath., Disp: , Rfl:  .  ipratropium-albuterol (DUONEB) 0.5-2.5 (3) MG/3ML SOLN, Take 3 mLs by nebulization every 4 (four) hours as needed., Disp: 360 mL, Rfl: 12 .  morphine (MSIR) 15 MG tablet, Take 0.5 tablets (7.5 mg total) by mouth every 4 (four) hours as needed for moderate pain or severe pain., Disp: 100 tablet, Rfl:  0 .  RABEprazole (ACIPHEX) 20 MG tablet, Take 1 tablet (20 mg total) by mouth daily., Disp: 90 tablet, Rfl: 3 .  tiotropium (SPIRIVA HANDIHALER) 18 MCG inhalation capsule, Place 1 capsule (18 mcg total) into inhaler and inhale daily., Disp: 90 capsule, Rfl: 4  Review of Systems  Constitutional: Positive for chills, diaphoresis and fatigue (ongoing for months). Negative for appetite change and fever.  HENT: Positive for congestion, postnasal drip and rhinorrhea. Negative for ear pain, sneezing and sore throat.   Eyes: Positive for discharge and itching.  Respiratory: Positive for cough,  hemoptysis, shortness of breath and wheezing. Negative for chest tightness.   Cardiovascular: Positive for chest pain. Negative for palpitations.  Gastrointestinal: Negative for abdominal pain, nausea and vomiting.  Musculoskeletal: Positive for myalgias.  Neurological: Positive for headaches. Negative for dizziness and weakness.    Social History  Substance Use Topics  . Smoking status: Former Smoker    Packs/day: 1.50    Years: 30.00    Types: Cigarettes    Quit date: 11/21/2008  . Smokeless tobacco: Former Systems developer    Quit date: 07/22/2008  . Alcohol use No   Objective:   BP 110/72 (BP Location: Left Arm, Patient Position: Sitting, Cuff Size: Normal)   Pulse 93   Temp 99.3 F (37.4 C) (Oral)   Resp 20   SpO2 99% Comment: 3 L 02 nasal canula  Physical Exam   General Appearance:    Alert, cooperative, no distress  Eyes:    PERRL, conjunctiva/corneas clear, EOM's intact          Lungs:     Diminished breath sounds bilaterally. Occasional expiratory wheeze. No rales, respirations unlabored  Heart:    Regular rate and rhythm  Neurologic:   Awake, alert, oriented x 3. No apparent focal neurological           defect.           Assessment & Plan:     1. Pulmonary emphysema, unspecified emphysema type ([place])  - montelukast (SINGULAIR) 10 MG tablet; Take 1 tablet (10 mg total) by mouth at bedtime.  Dispense: 30 tablet; Refill: 3 - tiotropium (SPIRIVA HANDIHALER) 18 MCG inhalation capsule; Place 1 capsule (18 mcg total) into inhaler and inhale daily.  Dispense: 90 capsule; Refill: 4  2. Chronic pain associated with significant psychosocial dysfunction  - Morphine Sulfate 40 MG CP24; Take 40 mg by mouth daily.  Dispense: 30 capsule; Refill: 0  3. Anxiety   4. COPD exacerbation (HCC)  - predniSONE (DELTASONE) 10 MG tablet; 6 tablets for 1 day, then 5 for 1 day, then 4 for 1 day, then 3 for 1 day, then 2 for 1 day then 1 for 1 day.  Dispense: 21 tablet; Refill: 0 - amoxicillin  (AMOXIL) 500 MG capsule; Take 1 capsule (500 mg total) by mouth 3 (three) times daily.  Dispense: 30 capsule; Refill: 0 - HYDROcodone-homatropine (HYCODAN) 5-1.5 MG/5ML syrup; Take 5 mLs by mouth every 8 (eight) hours as needed for cough.  Dispense: 120 mL; Refill: 0  Call if symptoms change or if not rapidly improving.          Lelon Huh, MD  [facility]

## 2016-08-27 DIAGNOSIS — J9612 Chronic respiratory failure with hypercapnia: Secondary | ICD-10-CM | POA: Diagnosis not present

## 2016-08-27 DIAGNOSIS — E119 Type 2 diabetes mellitus without complications: Secondary | ICD-10-CM | POA: Diagnosis not present

## 2016-08-27 DIAGNOSIS — J441 Chronic obstructive pulmonary disease with (acute) exacerbation: Secondary | ICD-10-CM | POA: Diagnosis not present

## 2016-08-27 DIAGNOSIS — J9611 Chronic respiratory failure with hypoxia: Secondary | ICD-10-CM | POA: Diagnosis not present

## 2016-08-29 ENCOUNTER — Telehealth: Payer: Self-pay | Admitting: Family Medicine

## 2016-08-29 NOTE — Telephone Encounter (Signed)
[facility] called wanting an order for recert. To continue homecare nursing   Their call back is [phone]  Thanks Con Memos

## 2016-08-29 NOTE — Telephone Encounter (Signed)
That's fine

## 2016-08-29 NOTE — Telephone Encounter (Signed)
Please advise 

## 2016-08-30 DIAGNOSIS — J441 Chronic obstructive pulmonary disease with (acute) exacerbation: Secondary | ICD-10-CM | POA: Diagnosis not present

## 2016-08-30 DIAGNOSIS — E119 Type 2 diabetes mellitus without complications: Secondary | ICD-10-CM | POA: Diagnosis not present

## 2016-08-30 DIAGNOSIS — J9611 Chronic respiratory failure with hypoxia: Secondary | ICD-10-CM | POA: Diagnosis not present

## 2016-08-30 DIAGNOSIS — J9612 Chronic respiratory failure with hypercapnia: Secondary | ICD-10-CM | POA: Diagnosis not present

## 2016-08-30 NOTE — Telephone Encounter (Signed)
[facility] advised. Renaldo Fiddler, CMA

## 2016-09-23 ENCOUNTER — Telehealth: Payer: Self-pay | Admitting: Family Medicine

## 2016-09-23 ENCOUNTER — Other Ambulatory Visit: Payer: Self-pay | Admitting: Family Medicine

## 2016-09-23 MED ORDER — MORPHINE SULFATE ER 60 MG PO CP24: 60.0000 mg | ORAL_CAPSULE | Freq: Every day | ORAL | 0 refills | Status: DC

## 2016-09-23 NOTE — Telephone Encounter (Signed)
Patient is requesting that Morphine 40mg  be increased due to her chronic back pain. She reports that her current dose does not seem to be helping with the pain. She also reports that she is having associated headaches due to the pain being so severe. Patient reports that she is unable to take NSAIDS and tylenol does not help. Please advise. Thanks!

## 2016-09-23 NOTE — Telephone Encounter (Signed)
Pt states she is having pain from head to toe and the new Rx Morphine Sulfate 40 MG CP24 is not helping with the pain.  Pt is having headaches and cramps in her hands.  Pt is requesting a call back to discuss changing this Rx.  [facility].  [phone]

## 2016-09-26 ENCOUNTER — Other Ambulatory Visit: Payer: Self-pay | Admitting: Family Medicine

## 2016-10-01 ENCOUNTER — Other Ambulatory Visit: Payer: Self-pay | Admitting: Family Medicine

## 2016-10-01 DIAGNOSIS — F332 Major depressive disorder, recurrent severe without psychotic features: Secondary | ICD-10-CM

## 2016-10-22 ENCOUNTER — Other Ambulatory Visit: Payer: Self-pay | Admitting: Family Medicine

## 2016-10-22 MED ORDER — MORPHINE SULFATE ER 60 MG PO CP24: 60.0000 mg | ORAL_CAPSULE | Freq: Every day | ORAL | 0 refills | Status: DC

## 2016-10-22 NOTE — Telephone Encounter (Signed)
Refill request for morphine 60 mg qd Last filled by MD on- 09/23/2016 #30 x0 refills Last Appt: 08/19/2016 Next Appt: none Please advise refill?

## 2016-10-22 NOTE — Telephone Encounter (Signed)
Pt contacted office for refill request on the following medications: morphine (KADIAN) 60 MG 24 hr capsule  Last Rx: 09/23/16 Pt request a call when Rx is ready. Please advise. Thanks TNP

## 2016-10-23 ENCOUNTER — Other Ambulatory Visit: Payer: Self-pay | Admitting: *Deleted

## 2016-10-23 DIAGNOSIS — M5030 Other cervical disc degeneration, unspecified cervical region: Secondary | ICD-10-CM

## 2016-10-23 MED ORDER — MORPHINE SULFATE ER 60 MG PO CP24: 60.0000 mg | ORAL_CAPSULE | Freq: Every day | ORAL | 0 refills | Status: DC

## 2016-10-23 NOTE — Telephone Encounter (Signed)
Please advise refill? Dr. Rosanna Randy approved and printed rx but he is not here to sign.

## 2016-10-23 NOTE — Telephone Encounter (Signed)
Ok to void Tiffany Mclean's and shred and I will print and sign

## 2016-10-24 ENCOUNTER — Other Ambulatory Visit: Payer: Self-pay | Admitting: Family Medicine

## 2016-10-24 DIAGNOSIS — J9611 Chronic respiratory failure with hypoxia: Secondary | ICD-10-CM

## 2016-11-18 DIAGNOSIS — J9612 Chronic respiratory failure with hypercapnia: Secondary | ICD-10-CM | POA: Diagnosis not present

## 2016-11-18 DIAGNOSIS — E119 Type 2 diabetes mellitus without complications: Secondary | ICD-10-CM | POA: Diagnosis not present

## 2016-11-18 DIAGNOSIS — J9611 Chronic respiratory failure with hypoxia: Secondary | ICD-10-CM | POA: Diagnosis not present

## 2016-11-21 ENCOUNTER — Other Ambulatory Visit: Payer: Self-pay | Admitting: Family Medicine

## 2016-11-21 DIAGNOSIS — M5030 Other cervical disc degeneration, unspecified cervical region: Secondary | ICD-10-CM

## 2016-11-21 NOTE — Telephone Encounter (Signed)
Pt contacted office for refill request on the following medications:  morphine (KADIAN) 60 MG 24 hr capsule.  QZ#[phone]

## 2016-11-22 MED ORDER — MORPHINE SULFATE ER 60 MG PO CP24: 60.0000 mg | ORAL_CAPSULE | Freq: Every day | ORAL | 0 refills | Status: DC

## 2016-12-11 ENCOUNTER — Emergency Department: Payer: BLUE CROSS/BLUE SHIELD

## 2016-12-11 ENCOUNTER — Inpatient Hospital Stay
Admission: EM | Admit: 2016-12-11 | Discharge: 2016-12-25 | DRG: 870 | Disposition: A | Discharge status: Hospice | Payer: BLUE CROSS/BLUE SHIELD | Attending: Internal Medicine | Admitting: Internal Medicine

## 2016-12-11 ENCOUNTER — Other Ambulatory Visit: Payer: Self-pay | Admitting: Family Medicine

## 2016-12-11 ENCOUNTER — Encounter: Payer: Self-pay | Admitting: Emergency Medicine

## 2016-12-11 DIAGNOSIS — Z9981 Dependence on supplemental oxygen: Secondary | ICD-10-CM | POA: Diagnosis not present

## 2016-12-11 DIAGNOSIS — Z66 Do not resuscitate: Secondary | ICD-10-CM | POA: Diagnosis present

## 2016-12-11 DIAGNOSIS — Z87891 Personal history of nicotine dependence: Secondary | ICD-10-CM

## 2016-12-11 DIAGNOSIS — C801 Malignant (primary) neoplasm, unspecified: Secondary | ICD-10-CM | POA: Diagnosis present

## 2016-12-11 DIAGNOSIS — M1990 Unspecified osteoarthritis, unspecified site: Secondary | ICD-10-CM | POA: Diagnosis present

## 2016-12-11 DIAGNOSIS — E785 Hyperlipidemia, unspecified: Secondary | ICD-10-CM | POA: Diagnosis present

## 2016-12-11 DIAGNOSIS — F1910 Other psychoactive substance abuse, uncomplicated: Secondary | ICD-10-CM | POA: Diagnosis present

## 2016-12-11 DIAGNOSIS — Z885 Allergy status to narcotic agent status: Secondary | ICD-10-CM

## 2016-12-11 DIAGNOSIS — E1165 Type 2 diabetes mellitus with hyperglycemia: Secondary | ICD-10-CM | POA: Diagnosis present

## 2016-12-11 DIAGNOSIS — N390 Urinary tract infection, site not specified: Secondary | ICD-10-CM | POA: Diagnosis present

## 2016-12-11 DIAGNOSIS — J441 Chronic obstructive pulmonary disease with (acute) exacerbation: Secondary | ICD-10-CM

## 2016-12-11 DIAGNOSIS — J9622 Acute and chronic respiratory failure with hypercapnia: Secondary | ICD-10-CM | POA: Diagnosis present

## 2016-12-11 DIAGNOSIS — J9690 Respiratory failure, unspecified, unspecified whether with hypoxia or hypercapnia: Secondary | ICD-10-CM | POA: Diagnosis present

## 2016-12-11 DIAGNOSIS — R6520 Severe sepsis without septic shock: Secondary | ICD-10-CM

## 2016-12-11 DIAGNOSIS — E870 Hyperosmolality and hypernatremia: Secondary | ICD-10-CM | POA: Diagnosis present

## 2016-12-11 DIAGNOSIS — M797 Fibromyalgia: Secondary | ICD-10-CM | POA: Diagnosis present

## 2016-12-11 DIAGNOSIS — J9621 Acute and chronic respiratory failure with hypoxia: Secondary | ICD-10-CM | POA: Diagnosis present

## 2016-12-11 DIAGNOSIS — Z79891 Long term (current) use of opiate analgesic: Secondary | ICD-10-CM

## 2016-12-11 DIAGNOSIS — G9341 Metabolic encephalopathy: Secondary | ICD-10-CM | POA: Diagnosis present

## 2016-12-11 DIAGNOSIS — Z8544 Personal history of malignant neoplasm of other female genital organs: Secondary | ICD-10-CM

## 2016-12-11 DIAGNOSIS — Z9911 Dependence on respirator [ventilator] status: Secondary | ICD-10-CM

## 2016-12-11 DIAGNOSIS — D638 Anemia in other chronic diseases classified elsewhere: Secondary | ICD-10-CM | POA: Diagnosis present

## 2016-12-11 DIAGNOSIS — E119 Type 2 diabetes mellitus without complications: Secondary | ICD-10-CM | POA: Diagnosis present

## 2016-12-11 DIAGNOSIS — E872 Acidosis: Secondary | ICD-10-CM | POA: Diagnosis present

## 2016-12-11 DIAGNOSIS — Z91013 Allergy to seafood: Secondary | ICD-10-CM

## 2016-12-11 DIAGNOSIS — Z8614 Personal history of Methicillin resistant Staphylococcus aureus infection: Secondary | ICD-10-CM

## 2016-12-11 DIAGNOSIS — Z881 Allergy status to other antibiotic agents status: Secondary | ICD-10-CM

## 2016-12-11 DIAGNOSIS — Z90710 Acquired absence of both cervix and uterus: Secondary | ICD-10-CM

## 2016-12-11 DIAGNOSIS — F329 Major depressive disorder, single episode, unspecified: Secondary | ICD-10-CM | POA: Diagnosis present

## 2016-12-11 DIAGNOSIS — R6521 Severe sepsis with septic shock: Secondary | ICD-10-CM | POA: Diagnosis present

## 2016-12-11 DIAGNOSIS — K219 Gastro-esophageal reflux disease without esophagitis: Secondary | ICD-10-CM | POA: Diagnosis present

## 2016-12-11 DIAGNOSIS — A419 Sepsis, unspecified organism: Secondary | ICD-10-CM

## 2016-12-11 DIAGNOSIS — Z7189 Other specified counseling: Secondary | ICD-10-CM

## 2016-12-11 DIAGNOSIS — J9601 Acute respiratory failure with hypoxia: Secondary | ICD-10-CM

## 2016-12-11 DIAGNOSIS — Z91040 Latex allergy status: Secondary | ICD-10-CM

## 2016-12-11 DIAGNOSIS — R7881 Bacteremia: Secondary | ICD-10-CM

## 2016-12-11 DIAGNOSIS — J189 Pneumonia, unspecified organism: Secondary | ICD-10-CM | POA: Diagnosis present

## 2016-12-11 DIAGNOSIS — Z515 Encounter for palliative care: Secondary | ICD-10-CM | POA: Diagnosis present

## 2016-12-11 DIAGNOSIS — J9602 Acute respiratory failure with hypercapnia: Secondary | ICD-10-CM

## 2016-12-11 DIAGNOSIS — Z7401 Bed confinement status: Secondary | ICD-10-CM

## 2016-12-11 DIAGNOSIS — D649 Anemia, unspecified: Secondary | ICD-10-CM | POA: Diagnosis present

## 2016-12-11 DIAGNOSIS — Z886 Allergy status to analgesic agent status: Secondary | ICD-10-CM

## 2016-12-11 DIAGNOSIS — Z981 Arthrodesis status: Secondary | ICD-10-CM

## 2016-12-11 DIAGNOSIS — Z4659 Encounter for fitting and adjustment of other gastrointestinal appliance and device: Secondary | ICD-10-CM

## 2016-12-11 DIAGNOSIS — F410 Panic disorder [episodic paroxysmal anxiety] without agoraphobia: Secondary | ICD-10-CM | POA: Diagnosis present

## 2016-12-11 DIAGNOSIS — J449 Chronic obstructive pulmonary disease, unspecified: Secondary | ICD-10-CM | POA: Diagnosis present

## 2016-12-11 DIAGNOSIS — B9562 Methicillin resistant Staphylococcus aureus infection as the cause of diseases classified elsewhere: Secondary | ICD-10-CM

## 2016-12-11 LAB — COMPREHENSIVE METABOLIC PANEL
ALBUMIN: 3.9 g/dL (ref 3.5–5.0)
ALK PHOS: 100 U/L (ref 38–126)
ALT: 31 U/L (ref 14–54)
ANION GAP: 9 (ref 5–15)
AST: 58 U/L — ABNORMAL HIGH (ref 15–41)
BUN: 33 mg/dL — ABNORMAL HIGH (ref 6–20)
CHLORIDE: 92 mmol/L — AB (ref 101–111)
CO2: 43 mmol/L — AB (ref 22–32)
Calcium: 9.8 mg/dL (ref 8.9–10.3)
Creatinine, Ser: 0.68 mg/dL (ref 0.44–1.00)
GFR calc Af Amer: >60 mL/min (ref >60)
GFR calc non Af Amer: >60 mL/min (ref >60)
GLUCOSE: 178 mg/dL — AB (ref 65–99)
Potassium: 4.6 mmol/L (ref 3.5–5.1)
SODIUM: 144 mmol/L (ref 135–145)
Total Bilirubin: 0.9 mg/dL (ref 0.3–1.2)
Total Protein: 7.5 g/dL (ref 6.5–8.1)

## 2016-12-11 LAB — CBC WITH DIFFERENTIAL/PLATELET
Basophils Absolute: 0 10*3/uL (ref 0–0.1)
Basophils Relative: 0 %
EOS ABS: 0 10*3/uL (ref 0–0.7)
Eosinophils Relative: 0 %
HEMATOCRIT: 35.5 % (ref 35.0–47.0)
HEMOGLOBIN: 11.2 g/dL — AB (ref 12.0–16.0)
LYMPHS ABS: 1.3 10*3/uL (ref 1.0–3.6)
LYMPHS PCT: 11 %
MCH: 28.1 pg (ref 26.0–34.0)
MCHC: 31.5 g/dL — AB (ref 32.0–36.0)
MCV: 89 fL (ref 80.0–100.0)
MONOS PCT: 6 %
Monocytes Absolute: 0.7 10*3/uL (ref 0.2–0.9)
NEUTROS ABS: 9.7 10*3/uL — AB (ref 1.4–6.5)
NEUTROS PCT: 83 %
Platelets: 215 10*3/uL (ref 150–440)
RBC: 3.99 MIL/uL (ref 3.80–5.20)
RDW: 13.6 % (ref 11.5–14.5)
WBC: 11.7 10*3/uL — ABNORMAL HIGH (ref 3.6–11.0)

## 2016-12-11 LAB — MAGNESIUM: Magnesium: 1.8 mg/dL (ref 1.7–2.4)

## 2016-12-11 LAB — LACTIC ACID, PLASMA: Lactic Acid, Venous: 3.5 mmol/L (ref 0.5–1.9)

## 2016-12-11 MED ORDER — AMOXICILLIN 500 MG PO CAPS: 1000.0000 mg | ORAL_CAPSULE | Freq: Two times a day (BID) | ORAL | 0 refills | Status: DC

## 2016-12-11 MED ORDER — IPRATROPIUM-ALBUTEROL 0.5-2.5 (3) MG/3ML IN SOLN
3.0000 mL | Freq: Once | RESPIRATORY_TRACT | Status: AC
  Administered 2016-12-11: 3 mL via RESPIRATORY_TRACT
  Filled 2016-12-11: qty 3

## 2016-12-11 MED ORDER — ROCURONIUM BROMIDE 50 MG/5ML IV SOLN
60.0000 mg | Freq: Once | INTRAVENOUS | Status: AC
  Administered 2016-12-11: 100 mg via INTRAVENOUS
  Filled 2016-12-11: qty 6

## 2016-12-11 MED ORDER — SODIUM CHLORIDE 0.9 % IV BOLUS (SEPSIS)
1000.0000 mL | Freq: Once | INTRAVENOUS | Status: AC
  Administered 2016-12-11: 1000 mL via INTRAVENOUS

## 2016-12-11 MED ORDER — ONDANSETRON HCL 4 MG/2ML IJ SOLN: 4.0000 mg | Freq: Once | INTRAMUSCULAR | Status: DC

## 2016-12-11 MED ORDER — ONDANSETRON HCL 4 MG/2ML IJ SOLN
INTRAMUSCULAR | Status: AC
  Filled 2016-12-11: qty 2

## 2016-12-11 MED ORDER — SODIUM CHLORIDE 0.9 % IV SOLN
1.0000 g | Freq: Three times a day (TID) | INTRAVENOUS | Status: DC
  Administered 2016-12-11 – 2016-12-19 (×23): 1 g via INTRAVENOUS
  Filled 2016-12-11 (×27): qty 1

## 2016-12-11 MED ORDER — KETAMINE HCL 10 MG/ML IJ SOLN
INTRAMUSCULAR | Status: AC
  Administered 2016-12-11: 120 mg via INTRAVENOUS
  Filled 2016-12-11: qty 1

## 2016-12-11 MED ORDER — MEROPENEM-SODIUM CHLORIDE 500 MG/50ML IV SOLR
INTRAVENOUS | Status: AC
  Filled 2016-12-11: qty 50

## 2016-12-11 MED ORDER — SODIUM CHLORIDE 0.9 % IV SOLN
30.0000 ug/h | INTRAVENOUS | Status: DC
  Administered 2016-12-12: 30 ug/h via INTRAVENOUS
  Filled 2016-12-11: qty 50

## 2016-12-11 MED ORDER — HYDROCORTISONE NA SUCCINATE PF 100 MG IJ SOLR
100.0000 mg | Freq: Once | INTRAMUSCULAR | Status: AC
  Administered 2016-12-11: 100 mg via INTRAVENOUS
  Filled 2016-12-11: qty 2

## 2016-12-11 MED ORDER — KETAMINE HCL 10 MG/ML IJ SOLN
2.0000 mg/kg | Freq: Once | INTRAMUSCULAR | Status: AC
  Administered 2016-12-11: 120 mg via INTRAVENOUS

## 2016-12-11 NOTE — Progress Notes (Signed)
Husband here today and reports Tiffany Mclean has been acting more confuse but refused to come in to be evaluated

## 2016-12-11 NOTE — ED Triage Notes (Signed)
Pt comes to the ED via EMS from home c/o altered mental status for 3 days.  Patient recently diagnosed with UTI and started on antibiotics.  Patient's husband states she had increased confusion today.  When EMS arrived she was at mid 70's room air.  Patient chronically on 3 L [place].  Patient on non-re breather at 88%.  CBG 207.

## 2016-12-11 NOTE — ED Provider Notes (Signed)
[facility] Emergency Department Provider Note    First MD Initiated Contact with Patient 12/11/16 2256     (approximate)  I have reviewed the triage vital signs and the nursing notes.   HISTORY  Chief Complaint Altered Mental Status  Level V Caveat:  Acute metabolic encephalopathy  HPI Tiffany Mclean is a 66 y.o. female history cancer as well as COPD and diabetes recently started on amoxicillin for UTI.  Presents with altered metal status worsening over the past 3 days. Patient has been reportedly stated that she was with increased confusion today. EMS arrived and found her hypoxic to the mid 70s on room air but the patient is chronically on 3 L nasal cannula. She is brought in EMS on nonrebreather with saturations 88%. On arrival to the ER the patient does appear toxic and critically ill.   Past Medical History:  Diagnosis Date  . Cancer (HCC)    squamous cell, vaginal  . COPD (chronic obstructive pulmonary disease) ([place])   . Diabetes mellitus without complication ([place])   . Pneumonia    Family History  Problem Relation Age of Onset  . Gout Mother   . Hypertension Mother   . Arthritis Mother   . Stroke Mother   . Kidney disease Mother   . CAD Mother   . Emphysema Father   . Gout Father   . Hypertension Father   . Kidney disease Father   . Arthritis Father   . Ulcers Father    Past Surgical History:  Procedure Laterality Date  . ABDOMINAL HYSTERECTOMY  1996   BSO. Dr. Ammie Dalton  . BREAST ENHANCEMENT SURGERY     rupture  . BREAST SURGERY  1983   Breast Augmentation  . [place]  . CHOLECYSTECTOMY  12/20/2010   Laparoscopic, Dr. Marina Gravel, [facility]  . EYE SURGERY     1977  . TUBAL LIGATION  1986   Patient Active Problem List   Diagnosis Date Noted  . Acute respiratory failure with hypoxia and hypercapnia ([place]) 12/12/2016  . Dysphagia 04/21/2016  . COPD exacerbation ([place]) 04/17/2016  . Tremor 06/13/2015  .  Depression 05/15/2015  . Anxiety 02/13/2015  . Dermatitis, eczematoid 02/13/2015  . Hypokalemia 02/13/2015  . Lower extremity weakness 02/13/2015  . Leukocytosis 02/13/2015  . Magnesium deficiency 02/13/2015  . Symptomatic menopausal or female climacteric states 02/13/2015  . Panic disorder 02/13/2015  . Bed sore on buttock 02/08/2015  . COPD with emphysema ([place]) 11/28/2014  . Chronic respiratory failure with hypoxia ([place]) 11/28/2014  . GERD (gastroesophageal reflux disease) 11/28/2014  . Anemia of chronic disease 11/28/2014  . Pneumonia 11/23/2014  . Generalized anxiety disorder 11/23/2014  . Injury of kidney 10/31/2014  . Personal history of perinatal problems 10/24/2014  . Chronic pain associated with significant psychosocial dysfunction 02/21/2010  . Anemia, iron deficiency 01/29/2010  . Allergic rhinitis 12/01/2009  . DDD (degenerative disc disease), cervical 12/01/2009  . Major depression 06/30/2009  . Abdominal pain, generalized 03/21/2009  . Vitamin D deficiency 07/22/2008  . Hypercholesterolemia without hypertriglyceridemia 03/11/2007  . Essential and other specified forms of tremor 07/22/1998      Prior to Admission medications   Medication Sig Start Date End Date Taking? Authorizing Provider  acetaminophen (TYLENOL) 325 MG tablet Take 650 mg by mouth every 6 (six) hours as needed for mild pain or fever.   Yes [provider]  albuterol (ACCUNEB) 1.25 MG/3ML nebulizer solution Take 1 ampule by nebulization  4 (four) times daily as needed for shortness of breath.   Yes [provider]  ALPRAZolam (XANAX) 0.25 MG tablet Take 1 tablet (0.25 mg total) by mouth every 6 (six) hours as needed. Patient taking differently: Take 0.25 mg by mouth every 6 (six) hours as needed for anxiety.  06/21/16  Yes Birdie Sons, MD  amoxicillin (AMOXIL) 500 MG capsule Take 2 capsules (1,000 mg total) by mouth 2 (two) times daily. 12/11/16 12/21/16 Yes Birdie Sons, MD    atorvastatin (LIPITOR) 40 MG tablet TAKE ONE TABLET BY MOUTH ONCE DAILY 09/26/16  Yes Birdie Sons, MD  docusate sodium (COLACE) 100 MG capsule Take 100 mg by mouth 2 (two) times daily as needed for mild constipation.   Yes [provider]  DULoxetine (CYMBALTA) 30 MG capsule TAKE 3 CAPSULES (90 MG TOTAL) BY MOUTH DAILY. 10/01/16  Yes Birdie Sons, MD  guaiFENesin-dextromethorphan (ROBITUSSIN DM) 100-10 MG/5ML syrup Take 5 mLs by mouth every 6 (six) hours as needed for cough. 04/21/16  Yes Theodoro Grist, MD  ipratropium (ATROVENT) 0.02 % nebulizer solution Take 0.5 mg by nebulization every 6 (six) hours as needed for wheezing or shortness of breath.   Yes [provider]  ipratropium-albuterol (DUONEB) 0.5-2.5 (3) MG/3ML SOLN Take 3 mLs by nebulization every 4 (four) hours as needed. 04/21/16  Yes Theodoro Grist, MD  meclizine (ANTIVERT) 12.5 MG tablet Take 12.5 mg by mouth 3 (three) times daily as needed for dizziness.   Yes [provider]  morphine (KADIAN) 60 MG 24 hr capsule Take 1 capsule (60 mg total) by mouth daily. 11/22/16  Yes Birdie Sons, MD  Potassium 99 MG TABS Take 1 tablet by mouth daily.   Yes [provider]  RABEprazole (ACIPHEX) 20 MG tablet Take 1 tablet (20 mg total) by mouth daily. 05/23/15  Yes Birdie Sons, MD  sodium chloride (OCEAN) 0.65 % SOLN nasal spray Place 1 spray into both nostrils as needed for congestion.   Yes [provider]  tiotropium (SPIRIVA HANDIHALER) 18 MCG inhalation capsule Place 1 capsule (18 mcg total) into inhaler and inhale daily. 08/19/16  Yes Birdie Sons, MD  VENTOLIN HFA 108 (90 Base) MCG/ACT inhaler INHALE 2 PUFFS INTO THE LUNGS EVERY 6 (SIX) HOURS AS NEEDED FOR WHEEZING OR SHORTNESS OF BREATH. 10/24/16  Yes Burnette, Clearnce Sorrel, PA-C    Allergies Asa [aspirin]; Avelox [moxifloxacin]; Cephalexin; Codeine; Erythromycin ethylsuccinate; Flagyl [metronidazole]; Latex; Levaquin [levofloxacin];  Meloxicam; Shellfish allergy; Sulfa antibiotics; Topiramate; Doxycycline; and Tetracyclines & related    Social History Social History  Substance Use Topics  . Smoking status: Former Smoker    Packs/day: 1.50    Years: 30.00    Types: Cigarettes    Quit date: 11/21/2008  . Smokeless tobacco: Former Systems developer    Quit date: 07/22/2008  . Alcohol use No    Review of Systems Unable to obtain due to acuity of condition ______________________________   PHYSICAL EXAM:  VITAL SIGNS: Vitals:   12/12/16 0025 12/12/16 0053  BP: 124/66 (!) 147/73  Pulse: (!) 107 (!) 107  Resp: 19 18  Temp: (!) 96.7 F (35.9 C) (!) 96.6 F (35.9 C)    Constitutional: Critically ill appearing  Eyes: Conjunctivae are normal. PERRL. EOMI. Head: Atraumatic. Nose: No congestion/rhinnorhea. Mouth/Throat: Mucous membranes are moist.  Oropharynx non-erythematous. Neck: No stridor. Painless ROM. No cervical spine tenderness to palpation Hematological/Lymphatic/Immunilogical: No cervical lymphadenopathy. Cardiovascular: Tachycardic, regular rhythm. Grossly normal heart sounds.  Good  peripheral circulation. Respiratory: Tachypnea, use of accessory muscles, diffuse rhonchorous breath sounds bilaterally. Gastrointestinal: Soft and nontender. No distention. No abdominal bruits. No CVA tenderness. Musculoskeletal: No lower extremity tenderness nor edema.  No joint effusions. Neurologic encephalopathic, unable to cooperate with exam. Skin:  Skin is warm, dry, no rash   ____________________________________________   LABS (all labs ordered are listed, but only abnormal results are displayed)  Results for orders placed or performed during the hospital encounter of 12/11/16 (from the past 24 hour(s))  Comprehensive metabolic panel     Status: Abnormal   Collection Time: 12/11/16 10:51 PM  Result Value Ref Range   Sodium 144 135 - 145 mmol/L   Potassium 4.6 3.5 - 5.1 mmol/L   Chloride 92 (L) 101 - 111 mmol/L   CO2  43 (H) 22 - 32 mmol/L   Glucose, Bld 178 (H) 65 - 99 mg/dL   BUN 33 (H) 6 - 20 mg/dL   Creatinine, Ser 0.68 0.44 - 1.00 mg/dL   Calcium 9.8 8.9 - 10.3 mg/dL   Total Protein 7.5 6.5 - 8.1 g/dL   Albumin 3.9 3.5 - 5.0 g/dL   AST 58 (H) 15 - 41 U/L   ALT 31 14 - 54 U/L   Alkaline Phosphatase 100 38 - 126 U/L   Total Bilirubin 0.9 0.3 - 1.2 mg/dL   GFR calc non Af Amer >60 >60 mL/min   GFR calc Af Amer >60 >60 mL/min   Anion gap 9 5 - 15  Magnesium     Status: None   Collection Time: 12/11/16 10:51 PM  Result Value Ref Range   Magnesium 1.8 1.7 - 2.4 mg/dL  Urinalysis, Routine w reflex microscopic     Status: Abnormal   Collection Time: 12/11/16 10:52 PM  Result Value Ref Range   Color, Urine YELLOW (A) YELLOW   APPearance CLOUDY (A) CLEAR   Specific Gravity, Urine 1.015 1.005 - 1.030   pH 5.0 5.0 - 8.0   Glucose, UA NEGATIVE NEGATIVE mg/dL   Hgb urine dipstick SMALL (A) NEGATIVE   Bilirubin Urine NEGATIVE NEGATIVE   Ketones, ur NEGATIVE NEGATIVE mg/dL   Protein, ur 100 (A) NEGATIVE mg/dL   Nitrite NEGATIVE NEGATIVE   Leukocytes, UA NEGATIVE NEGATIVE   RBC / HPF 0-5 0 - 5 RBC/hpf   WBC, UA 0-5 0 - 5 WBC/hpf   Bacteria, UA NONE SEEN NONE SEEN   Squamous Epithelial / LPF 0-5 (A) NONE SEEN   Mucous PRESENT    Hyaline Casts, UA PRESENT    Amorphous Crystal PRESENT   Lactic acid, plasma     Status: Abnormal   Collection Time: 12/11/16 10:52 PM  Result Value Ref Range   Lactic Acid, Venous 3.5 (HH) 0.5 - 1.9 mmol/L  CBC with Differential/Platelet     Status: Abnormal   Collection Time: 12/11/16 10:52 PM  Result Value Ref Range   WBC 11.7 (H) 3.6 - 11.0 K/uL   RBC 3.99 3.80 - 5.20 MIL/uL   Hemoglobin 11.2 (L) 12.0 - 16.0 g/dL   HCT 35.5 35.0 - 47.0 %   MCV 89.0 80.0 - 100.0 fL   MCH 28.1 26.0 - 34.0 pg   MCHC 31.5 (L) 32.0 - 36.0 g/dL   RDW 13.6 11.5 - 14.5 %   Platelets 215 150 - 440 K/uL   Neutrophils Relative % 83 %   Neutro Abs 9.7 (H) 1.4 - 6.5 K/uL   Lymphocytes  Relative 11 %   Lymphs Abs 1.3  1.0 - 3.6 K/uL   Monocytes Relative 6 %   Monocytes Absolute 0.7 0.2 - 0.9 K/uL   Eosinophils Relative 0 %   Eosinophils Absolute 0.0 0 - 0.7 K/uL   Basophils Relative 0 %   Basophils Absolute 0.0 0 - 0.1 K/uL  Urine Drug Screen, Qualitative ([facility] only)     Status: Abnormal   Collection Time: 12/11/16 10:52 PM  Result Value Ref Range   Tricyclic, Ur Screen NONE DETECTED NONE DETECTED   Amphetamines, Ur Screen NONE DETECTED NONE DETECTED   MDMA (Ecstasy)Ur Screen NONE DETECTED NONE DETECTED   Cocaine Metabolite,Ur [place] NONE DETECTED NONE DETECTED   Opiate, Ur Screen POSITIVE (A) NONE DETECTED   Phencyclidine (PCP) Ur S NONE DETECTED NONE DETECTED   Cannabinoid 50 Ng, Ur [place] NONE DETECTED NONE DETECTED   Barbiturates, Ur Screen NONE DETECTED NONE DETECTED   Benzodiazepine, Ur Scrn POSITIVE (A) NONE DETECTED   Methadone Scn, Ur NONE DETECTED NONE DETECTED  Blood gas, venous     Status: Abnormal (Preliminary result)   Collection Time: 12/11/16 11:35 PM  Result Value Ref Range   pH, Ven 7.18 (LL) 7.250 - 7.430   pCO2, Ven 120 (HH) 44.0 - 60.0 mmHg   pO2, Ven 34.0 32.0 - 45.0 mmHg   Bicarbonate PENDING 20.0 - 28.0 mmol/L   O2 Saturation PENDING %   Patient temperature 37.0    Collection site VENOUS    Sample type VENOUS    ____________________________________________  EKG My review and personal interpretation at Time: 22:52   Indication: sepsis  Rate: 119  Rhythm: sinus tach Axis: normal Other: non specific st changes likely rate dependent, no STEMI ____________________________________________  RADIOLOGY  I personally reviewed all radiographic images ordered to evaluate for the above acute complaints and reviewed radiology reports and findings.  These findings were personally discussed with the patient.  Please see medical record for radiology report.  ____________________________________________   PROCEDURES  Procedure(s) performed:    Date/Time: 12/12/2016 1:03 AM Performed by: Merlyn Lot Pre-anesthesia Checklist: Patient identified, Emergency Drugs available, Suction available, Patient being monitored and Timeout performed Oxygen Delivery Method: Ambu bag Preoxygenation: Pre-oxygenation with 100% oxygen Intubation Type: Rapid sequence Ventilation: Mask ventilation with difficulty Laryngoscope Size: Glidescope and 3 Grade View: Grade I Tube size: 7.5 mm Number of attempts: 1 Airway Equipment and Method: Stylet and Video-laryngoscopy Placement Confirmation: Positive ETCO2,  ETT inserted through vocal cords under direct vision and Breath sounds checked- equal and bilateral Secured at: 22 cm Tube secured with: ETT holder Dental Injury: Teeth and Oropharynx as per pre-operative assessment          Critical Care performed: yes CRITICAL CARE Performed by: Merlyn Lot   Total critical care time: 50 minutes  Critical care time was exclusive of separately billable procedures and treating other patients.  Critical care was necessary to treat or prevent imminent or life-threatening deterioration.  Critical care was time spent personally by me on the following activities: development of treatment plan with patient and/or surrogate as well as nursing, discussions with consultants, evaluation of patient's response to treatment, examination of patient, obtaining history from patient or surrogate, ordering and performing treatments and interventions, ordering and review of laboratory studies, ordering and review of radiographic studies, pulse oximetry and re-evaluation of patient's condition.  ____________________________________________   INITIAL IMPRESSION / ASSESSMENT AND PLAN / ED COURSE  Pertinent labs & imaging results that were available during my care of the patient were reviewed by me and considered in  my medical decision making (see chart for details).  DDX: pna, uti, intra-abdominal inf,  cellulitis, bacteremia  Tiffany Mclean is a 66 y.o. who presents to the ED with altered mental status hypothermia and critically ill in moderate respiratory distress. Patient's presentation concerning for the above differential. Abdominal exam is soft and benign. Patient placed on supplement oxygenation with improvement in her O2 sats. Patient was given nebulizer treatments for underlying COPD. Based on her confusion did not place her on BiPAP initially due to concern for aspiration while trying to clarify goals of care with the husband at bedside. Is this time is uncertain whether she would've wanted to be full code.  The patient will be placed on continuous pulse oximetry and telemetry for monitoring.  Laboratory evaluation will be sent to evaluate for the above complaints.    Clinical Course as of Dec 13 102  Wed Dec 11, 2016  2349 Patient with profound respiratory acidosis. After conversation with husband and unclear advanced medical directives husband has decided that he doesn't hurt her before code. We'll proceed with intubation  [PR]  Thu Dec 12, 2016  0007 Patient intubated without any palpitations. Tolerating that well with sedation. Hemodynamics continued to stabilize. At this point source of sepsis most likely secondary to urinary tract infection versus pneumonia the chest x-ray does not show any significant consolidation.  Abx in.    [PR]  0101 Urine without any evidence of significant bacteria. Urine drug screen does test positive for opiates and benzos which would explain the patient's acute hypercapnic respiratory failure.  Patient will be admitted to ICU.  [PR]    Clinical Course User Index [PR] Merlyn Lot, MD     ____________________________________________   FINAL CLINICAL IMPRESSION(S) / ED DIAGNOSES  Final diagnoses:  Acute respiratory failure with hypoxia and hypercapnia (HCC)  Severe sepsis ([place])  Acute metabolic encephalopathy  Polysubstance abuse       NEW MEDICATIONS STARTED DURING THIS VISIT:  New Prescriptions   No medications on file     Note:  This document was prepared using Dragon voice recognition software and may include unintentional dictation errors.    Merlyn Lot, MD 12/12/16 [phone]

## 2016-12-12 ENCOUNTER — Inpatient Hospital Stay: Payer: BLUE CROSS/BLUE SHIELD

## 2016-12-12 ENCOUNTER — Emergency Department: Payer: BLUE CROSS/BLUE SHIELD

## 2016-12-12 ENCOUNTER — Encounter: Payer: Self-pay | Admitting: Radiology

## 2016-12-12 DIAGNOSIS — J9690 Respiratory failure, unspecified, unspecified whether with hypoxia or hypercapnia: Secondary | ICD-10-CM | POA: Diagnosis present

## 2016-12-12 DIAGNOSIS — Z9911 Dependence on respirator [ventilator] status: Secondary | ICD-10-CM | POA: Diagnosis not present

## 2016-12-12 DIAGNOSIS — J9622 Acute and chronic respiratory failure with hypercapnia: Secondary | ICD-10-CM | POA: Diagnosis not present

## 2016-12-12 DIAGNOSIS — J9621 Acute and chronic respiratory failure with hypoxia: Secondary | ICD-10-CM | POA: Diagnosis not present

## 2016-12-12 DIAGNOSIS — Z7189 Other specified counseling: Secondary | ICD-10-CM

## 2016-12-12 DIAGNOSIS — F1910 Other psychoactive substance abuse, uncomplicated: Secondary | ICD-10-CM | POA: Diagnosis not present

## 2016-12-12 DIAGNOSIS — A419 Sepsis, unspecified organism: Secondary | ICD-10-CM | POA: Diagnosis not present

## 2016-12-12 LAB — COMPREHENSIVE METABOLIC PANEL
ALBUMIN: 3.2 g/dL — AB (ref 3.5–5.0)
ALK PHOS: 86 U/L (ref 38–126)
ALT: 32 U/L (ref 14–54)
ANION GAP: 10 (ref 5–15)
AST: 64 U/L — ABNORMAL HIGH (ref 15–41)
BUN: 27 mg/dL — ABNORMAL HIGH (ref 6–20)
CO2: 35 mmol/L — AB (ref 22–32)
Calcium: 8.6 mg/dL — ABNORMAL LOW (ref 8.9–10.3)
Chloride: 97 mmol/L — ABNORMAL LOW (ref 101–111)
Creatinine, Ser: 0.68 mg/dL (ref 0.44–1.00)
GFR calc non Af Amer: >60 mL/min (ref >60)
GLUCOSE: 250 mg/dL — AB (ref 65–99)
POTASSIUM: 4.1 mmol/L (ref 3.5–5.1)
SODIUM: 142 mmol/L (ref 135–145)
Total Bilirubin: 0.8 mg/dL (ref 0.3–1.2)
Total Protein: 6.4 g/dL — ABNORMAL LOW (ref 6.5–8.1)

## 2016-12-12 LAB — BLOOD CULTURE ID PANEL (REFLEXED)
ACINETOBACTER BAUMANNII: NOT DETECTED
CANDIDA ALBICANS: NOT DETECTED
Candida glabrata: NOT DETECTED
Candida krusei: NOT DETECTED
Candida parapsilosis: NOT DETECTED
Candida tropicalis: NOT DETECTED
ENTEROBACTERIACEAE SPECIES: NOT DETECTED
ENTEROCOCCUS SPECIES: NOT DETECTED
Enterobacter cloacae complex: NOT DETECTED
Escherichia coli: NOT DETECTED
HAEMOPHILUS INFLUENZAE: NOT DETECTED
KLEBSIELLA OXYTOCA: NOT DETECTED
Klebsiella pneumoniae: NOT DETECTED
LISTERIA MONOCYTOGENES: NOT DETECTED
METHICILLIN RESISTANCE: DETECTED — AB
NEISSERIA MENINGITIDIS: NOT DETECTED
PSEUDOMONAS AERUGINOSA: NOT DETECTED
Proteus species: NOT DETECTED
STREPTOCOCCUS AGALACTIAE: NOT DETECTED
STREPTOCOCCUS PNEUMONIAE: NOT DETECTED
STREPTOCOCCUS PYOGENES: NOT DETECTED
STREPTOCOCCUS SPECIES: NOT DETECTED
Serratia marcescens: NOT DETECTED
Staphylococcus aureus (BCID): NOT DETECTED
Staphylococcus species: DETECTED — AB

## 2016-12-12 LAB — URINALYSIS, ROUTINE W REFLEX MICROSCOPIC
Bacteria, UA: NONE SEEN
Bilirubin Urine: NEGATIVE
Glucose, UA: NEGATIVE mg/dL
Ketones, ur: NEGATIVE mg/dL
Leukocytes, UA: NEGATIVE
NITRITE: NEGATIVE
Protein, ur: 100 mg/dL — AB
SPECIFIC GRAVITY, URINE: 1.015 (ref 1.005–1.030)
pH: 5 (ref 5.0–8.0)

## 2016-12-12 LAB — PHOSPHORUS: PHOSPHORUS: 2.1 mg/dL — AB (ref 2.5–4.6)

## 2016-12-12 LAB — BLOOD GAS, ARTERIAL
ACID-BASE EXCESS: 7.9 mmol/L — AB (ref 0.0–2.0)
Bicarbonate: 34.1 mmol/L — ABNORMAL HIGH (ref 20.0–28.0)
FIO2: 0.65
MECHVT: 450 mL
Mechanical Rate: 25
O2 SAT: 99.9 %
PEEP/CPAP: 5 cmH2O
PH ART: 7.4 (ref 7.350–7.450)
PO2 ART: 263 mmHg — AB (ref 83.0–108.0)
Patient temperature: 37
RATE: 25 resp/min
pCO2 arterial: 55 mmHg — ABNORMAL HIGH (ref 32.0–48.0)

## 2016-12-12 LAB — CBC
HCT: 32.8 % — ABNORMAL LOW (ref 35.0–47.0)
HEMOGLOBIN: 10.2 g/dL — AB (ref 12.0–16.0)
MCH: 28.3 pg (ref 26.0–34.0)
MCHC: 31.2 g/dL — AB (ref 32.0–36.0)
MCV: 90.7 fL (ref 80.0–100.0)
Platelets: 177 10*3/uL (ref 150–440)
RBC: 3.62 MIL/uL — ABNORMAL LOW (ref 3.80–5.20)
RDW: 13.5 % (ref 11.5–14.5)
WBC: 10.2 10*3/uL (ref 3.6–11.0)

## 2016-12-12 LAB — URINE DRUG SCREEN, QUALITATIVE (ARMC ONLY)
Amphetamines, Ur Screen: NOT DETECTED
Barbiturates, Ur Screen: NOT DETECTED
Benzodiazepine, Ur Scrn: POSITIVE — AB
COCAINE METABOLITE, UR ~~LOC~~: NOT DETECTED
Cannabinoid 50 Ng, Ur ~~LOC~~: NOT DETECTED
MDMA (ECSTASY) UR SCREEN: NOT DETECTED
METHADONE SCREEN, URINE: NOT DETECTED
Opiate, Ur Screen: POSITIVE — AB
Phencyclidine (PCP) Ur S: NOT DETECTED
TRICYCLIC, UR SCREEN: NOT DETECTED

## 2016-12-12 LAB — LACTIC ACID, PLASMA
Lactic Acid, Venous: 5.1 mmol/L (ref 0.5–1.9)
Lactic Acid, Venous: 6.2 mmol/L (ref 0.5–1.9)
Lactic Acid, Venous: 1.1 mmol/L (ref 0.5–1.9)

## 2016-12-12 LAB — GLUCOSE, CAPILLARY
GLUCOSE-CAPILLARY: 153 mg/dL — AB (ref 65–99)
Glucose-Capillary: 149 mg/dL — ABNORMAL HIGH (ref 65–99)
Glucose-Capillary: 170 mg/dL — ABNORMAL HIGH (ref 65–99)
Glucose-Capillary: 109 mg/dL — ABNORMAL HIGH (ref 65–99)
Glucose-Capillary: 106 mg/dL — ABNORMAL HIGH (ref 65–99)
Glucose-Capillary: 139 mg/dL — ABNORMAL HIGH (ref 65–99)
Glucose-Capillary: 154 mg/dL — ABNORMAL HIGH (ref 65–99)

## 2016-12-12 LAB — APTT: aPTT: <24 seconds — ABNORMAL LOW (ref 24–36)

## 2016-12-12 LAB — PROCALCITONIN

## 2016-12-12 LAB — MAGNESIUM: MAGNESIUM: 2.2 mg/dL (ref 1.7–2.4)

## 2016-12-12 LAB — CORTISOL: Cortisol, Plasma: >100 ug/dL

## 2016-12-12 LAB — PROTIME-INR
INR: 0.98
Prothrombin Time: 13 seconds (ref 11.4–15.2)

## 2016-12-12 LAB — MRSA PCR SCREENING: MRSA BY PCR: NEGATIVE

## 2016-12-12 MED ORDER — SODIUM CHLORIDE 0.9 % IV SOLN
Freq: Once | INTRAVENOUS | Status: AC
  Administered 2016-12-12: 01:00:00 via INTRAVENOUS

## 2016-12-12 MED ORDER — FENTANYL CITRATE (PF) 100 MCG/2ML IJ SOLN: 50.0000 ug | Freq: Once | INTRAMUSCULAR | Status: AC

## 2016-12-12 MED ORDER — VANCOMYCIN HCL IN DEXTROSE 1-5 GM/200ML-% IV SOLN
1000.0000 mg | Freq: Two times a day (BID) | INTRAVENOUS | Status: DC
  Administered 2016-12-13: 1000 mg via INTRAVENOUS
  Filled 2016-12-12 (×2): qty 200

## 2016-12-12 MED ORDER — ONDANSETRON HCL 4 MG/2ML IJ SOLN
4.0000 mg | Freq: Four times a day (QID) | INTRAMUSCULAR | Status: DC | PRN
  Administered 2016-12-19 – 2016-12-24 (×4): 4 mg via INTRAVENOUS
  Filled 2016-12-12 (×5): qty 2

## 2016-12-12 MED ORDER — CHLORHEXIDINE GLUCONATE 0.12% ORAL RINSE (MEDLINE KIT)
15.0000 mL | Freq: Two times a day (BID) | OROMUCOSAL | Status: DC
  Administered 2016-12-12 – 2016-12-24 (×19): 15 mL via OROMUCOSAL

## 2016-12-12 MED ORDER — MIDAZOLAM HCL 2 MG/2ML IJ SOLN
2.0000 mg | INTRAMUSCULAR | Status: DC | PRN
  Administered 2016-12-13 (×2): 2 mg via INTRAVENOUS
  Administered 2016-12-13: 4 mg via INTRAVENOUS
  Administered 2016-12-13 – 2016-12-14 (×7): 2 mg via INTRAVENOUS
  Administered 2016-12-15 (×2): 4 mg via INTRAVENOUS
  Administered 2016-12-15 – 2016-12-17 (×5): 2 mg via INTRAVENOUS
  Filled 2016-12-12: qty 2
  Filled 2016-12-12: qty 4
  Filled 2016-12-12 (×3): qty 2
  Filled 2016-12-12 (×2): qty 4
  Filled 2016-12-12 (×2): qty 2
  Filled 2016-12-12: qty 4
  Filled 2016-12-12 (×3): qty 2
  Filled 2016-12-12 (×2): qty 4

## 2016-12-12 MED ORDER — MIDAZOLAM HCL 2 MG/2ML IJ SOLN
4.0000 mg | Freq: Once | INTRAMUSCULAR | Status: AC
  Administered 2016-12-12: 4 mg via INTRAVENOUS

## 2016-12-12 MED ORDER — BISACODYL 10 MG RE SUPP: 10.0000 mg | Freq: Every day | RECTAL | Status: DC | PRN

## 2016-12-12 MED ORDER — ALBUTEROL SULFATE (2.5 MG/3ML) 0.083% IN NEBU
2.5000 mg | INHALATION_SOLUTION | Freq: Once | RESPIRATORY_TRACT | Status: AC
  Administered 2016-12-12: 2.5 mg via RESPIRATORY_TRACT
  Filled 2016-12-12: qty 3

## 2016-12-12 MED ORDER — FENTANYL BOLUS VIA INFUSION
25.0000 ug | INTRAVENOUS | Status: DC | PRN
  Administered 2016-12-12 – 2016-12-13 (×4): 50 ug via INTRAVENOUS
  Filled 2016-12-12: qty 50

## 2016-12-12 MED ORDER — METHYLPREDNISOLONE SODIUM SUCC 40 MG IJ SOLR
40.0000 mg | Freq: Two times a day (BID) | INTRAMUSCULAR | Status: DC
  Administered 2016-12-12 – 2016-12-19 (×14): 40 mg via INTRAVENOUS
  Filled 2016-12-12 (×15): qty 1

## 2016-12-12 MED ORDER — BUDESONIDE 0.25 MG/2ML IN SUSP
0.2500 mg | Freq: Four times a day (QID) | RESPIRATORY_TRACT | Status: DC
  Administered 2016-12-12 – 2016-12-18 (×23): 0.25 mg via RESPIRATORY_TRACT
  Filled 2016-12-12 (×24): qty 2

## 2016-12-12 MED ORDER — PROPOFOL 1000 MG/100ML IV EMUL
0.0000 ug/kg/min | INTRAVENOUS | Status: DC
  Administered 2016-12-12 (×2): 30 ug/kg/min via INTRAVENOUS
  Administered 2016-12-13: 40 ug/kg/min via INTRAVENOUS
  Administered 2016-12-13: 20 ug/kg/min via INTRAVENOUS
  Administered 2016-12-14: 50 ug/kg/min via INTRAVENOUS
  Administered 2016-12-14 (×2): 40 ug/kg/min via INTRAVENOUS
  Administered 2016-12-15 (×2): 50 ug/kg/min via INTRAVENOUS
  Administered 2016-12-16: 16 ug/kg/min via INTRAVENOUS
  Administered 2016-12-17: 40 ug/kg/min via INTRAVENOUS
  Administered 2016-12-17: 20 ug/kg/min via INTRAVENOUS
  Administered 2016-12-18 (×2): 40 ug/kg/min via INTRAVENOUS
  Filled 2016-12-12 (×19): qty 100

## 2016-12-12 MED ORDER — BUDESONIDE 0.25 MG/2ML IN SUSP
0.2500 mg | Freq: Two times a day (BID) | RESPIRATORY_TRACT | Status: DC
  Administered 2016-12-12: 0.25 mg via RESPIRATORY_TRACT
  Filled 2016-12-12: qty 2

## 2016-12-12 MED ORDER — IOPAMIDOL (ISOVUE-370) INJECTION 76%
75.0000 mL | Freq: Once | INTRAVENOUS | Status: AC | PRN
  Administered 2016-12-12: 75 mL via INTRAVENOUS

## 2016-12-12 MED ORDER — MIDAZOLAM HCL 2 MG/2ML IJ SOLN
1.0000 mg | INTRAMUSCULAR | Status: DC | PRN
  Administered 2016-12-12: 1 mg via INTRAVENOUS

## 2016-12-12 MED ORDER — IPRATROPIUM BROMIDE 0.02 % IN SOLN
0.5000 mg | Freq: Four times a day (QID) | RESPIRATORY_TRACT | Status: DC | PRN
  Administered 2016-12-12: 0.5 mg via RESPIRATORY_TRACT
  Filled 2016-12-12: qty 2.5

## 2016-12-12 MED ORDER — SODIUM CHLORIDE 0.9 % IV SOLN
INTRAVENOUS | Status: DC
  Administered 2016-12-12: 03:00:00 via INTRAVENOUS

## 2016-12-12 MED ORDER — ENOXAPARIN SODIUM 40 MG/0.4ML ~~LOC~~ SOLN
40.0000 mg | SUBCUTANEOUS | Status: DC
  Administered 2016-12-12 – 2016-12-17 (×6): 40 mg via SUBCUTANEOUS
  Filled 2016-12-12 (×6): qty 0.4

## 2016-12-12 MED ORDER — ONDANSETRON HCL 4 MG PO TABS: 4.0000 mg | ORAL_TABLET | Freq: Four times a day (QID) | ORAL | Status: DC | PRN

## 2016-12-12 MED ORDER — FENTANYL 2500MCG IN NS 250ML (10MCG/ML) PREMIX INFUSION
25.0000 ug/h | INTRAVENOUS | Status: DC
  Administered 2016-12-12 (×2): 250 ug/h via INTRAVENOUS
  Administered 2016-12-13: 200 ug/h via INTRAVENOUS
  Filled 2016-12-12 (×3): qty 250

## 2016-12-12 MED ORDER — MIDAZOLAM HCL 2 MG/2ML IJ SOLN
2.0000 mg | INTRAMUSCULAR | Status: AC | PRN
  Administered 2016-12-12 – 2016-12-13 (×2): 2 mg via INTRAVENOUS
  Filled 2016-12-12 (×2): qty 4

## 2016-12-12 MED ORDER — ALBUTEROL (5 MG/ML) CONTINUOUS INHALATION SOLN: 10.0000 mg/h | INHALATION_SOLUTION | RESPIRATORY_TRACT | Status: DC

## 2016-12-12 MED ORDER — PANTOPRAZOLE SODIUM 40 MG IV SOLR
40.0000 mg | INTRAVENOUS | Status: DC
  Administered 2016-12-12 – 2016-12-17 (×6): 40 mg via INTRAVENOUS
  Filled 2016-12-12 (×6): qty 40

## 2016-12-12 MED ORDER — FREE WATER
200.0000 mL | Freq: Three times a day (TID) | Status: DC
  Administered 2016-12-12 – 2016-12-18 (×18): 200 mL

## 2016-12-12 MED ORDER — MAGNESIUM CITRATE PO SOLN
1.0000 | Freq: Once | ORAL | Status: DC | PRN
  Filled 2016-12-12: qty 296

## 2016-12-12 MED ORDER — ALBUTEROL SULFATE (2.5 MG/3ML) 0.083% IN NEBU: 2.5000 mg | INHALATION_SOLUTION | RESPIRATORY_TRACT | Status: DC | PRN

## 2016-12-12 MED ORDER — IPRATROPIUM-ALBUTEROL 0.5-2.5 (3) MG/3ML IN SOLN
3.0000 mL | Freq: Four times a day (QID) | RESPIRATORY_TRACT | Status: DC
  Administered 2016-12-12 – 2016-12-18 (×24): 3 mL via RESPIRATORY_TRACT
  Filled 2016-12-12 (×25): qty 3

## 2016-12-12 MED ORDER — SENNOSIDES-DOCUSATE SODIUM 8.6-50 MG PO TABS: 1.0000 | ORAL_TABLET | Freq: Every evening | ORAL | Status: DC | PRN

## 2016-12-12 MED ORDER — VANCOMYCIN HCL IN DEXTROSE 1-5 GM/200ML-% IV SOLN
1000.0000 mg | Freq: Two times a day (BID) | INTRAVENOUS | Status: DC
  Filled 2016-12-12 (×2): qty 200

## 2016-12-12 MED ORDER — VITAL HIGH PROTEIN PO LIQD: 1000.0000 mL | ORAL | Status: DC

## 2016-12-12 MED ORDER — BISACODYL 5 MG PO TBEC: 5.0000 mg | DELAYED_RELEASE_TABLET | Freq: Every day | ORAL | Status: DC | PRN

## 2016-12-12 MED ORDER — VITAL AF 1.2 CAL PO LIQD
1000.0000 mL | ORAL | Status: DC
  Administered 2016-12-12 – 2016-12-15 (×4): 1000 mL

## 2016-12-12 MED ORDER — PROPOFOL 1000 MG/100ML IV EMUL
INTRAVENOUS | Status: AC
  Administered 2016-12-12: 30 ug/kg/min via INTRAVENOUS
  Filled 2016-12-12: qty 100

## 2016-12-12 MED ORDER — SODIUM CHLORIDE 0.9 % IV BOLUS (SEPSIS)
1000.0000 mL | Freq: Once | INTRAVENOUS | Status: AC
  Administered 2016-12-12: 1000 mL via INTRAVENOUS

## 2016-12-12 MED ORDER — VANCOMYCIN HCL IN DEXTROSE 1-5 GM/200ML-% IV SOLN
1000.0000 mg | Freq: Once | INTRAVENOUS | Status: AC
  Administered 2016-12-12: 1000 mg via INTRAVENOUS
  Filled 2016-12-12 (×2): qty 200

## 2016-12-12 MED ORDER — INSULIN ASPART 100 UNIT/ML ~~LOC~~ SOLN
0.0000 [IU] | SUBCUTANEOUS | Status: DC
  Administered 2016-12-12 (×3): 3 [IU] via SUBCUTANEOUS
  Administered 2016-12-13: 2 [IU] via SUBCUTANEOUS
  Administered 2016-12-13: 3 [IU] via SUBCUTANEOUS
  Administered 2016-12-13 – 2016-12-14 (×6): 2 [IU] via SUBCUTANEOUS
  Administered 2016-12-15: 3 [IU] via SUBCUTANEOUS
  Administered 2016-12-15 – 2016-12-16 (×9): 2 [IU] via SUBCUTANEOUS
  Administered 2016-12-16: 3 [IU] via SUBCUTANEOUS
  Administered 2016-12-17 (×4): 2 [IU] via SUBCUTANEOUS
  Filled 2016-12-12 (×6): qty 2
  Filled 2016-12-12 (×3): qty 3
  Filled 2016-12-12 (×3): qty 2
  Filled 2016-12-12 (×2): qty 3
  Filled 2016-12-12 (×9): qty 2
  Filled 2016-12-12 (×3): qty 3

## 2016-12-12 MED ORDER — MIDAZOLAM HCL 2 MG/2ML IJ SOLN
INTRAMUSCULAR | Status: AC
  Filled 2016-12-12: qty 2

## 2016-12-12 MED ORDER — PRO-STAT SUGAR FREE PO LIQD: 30.0000 mL | Freq: Two times a day (BID) | ORAL | Status: DC

## 2016-12-12 MED ORDER — MIDAZOLAM HCL 2 MG/2ML IJ SOLN
1.0000 mg | INTRAMUSCULAR | Status: DC | PRN
  Filled 2016-12-12: qty 2

## 2016-12-12 MED ORDER — VANCOMYCIN HCL IN DEXTROSE 1-5 GM/200ML-% IV SOLN
1000.0000 mg | Freq: Once | INTRAVENOUS | Status: AC
  Administered 2016-12-12: 1000 mg via INTRAVENOUS
  Filled 2016-12-12: qty 200

## 2016-12-12 MED ORDER — ORAL CARE MOUTH RINSE
15.0000 mL | Freq: Four times a day (QID) | OROMUCOSAL | Status: DC
  Administered 2016-12-12 – 2016-12-25 (×46): 15 mL via OROMUCOSAL

## 2016-12-12 MED ORDER — SENNOSIDES 8.8 MG/5ML PO SYRP: 5.0000 mL | ORAL_SOLUTION | Freq: Two times a day (BID) | ORAL | Status: DC | PRN

## 2016-12-12 MED ORDER — ACETAMINOPHEN 325 MG PO TABS: 650.0000 mg | ORAL_TABLET | Freq: Four times a day (QID) | ORAL | Status: DC | PRN

## 2016-12-12 MED ORDER — SODIUM CHLORIDE 0.9 % IV SOLN
INTRAVENOUS | Status: DC
  Administered 2016-12-12 – 2016-12-15 (×3): via INTRAVENOUS

## 2016-12-12 MED ORDER — ACETAMINOPHEN 650 MG RE SUPP: 650.0000 mg | Freq: Four times a day (QID) | RECTAL | Status: DC | PRN

## 2016-12-12 MED ORDER — ALBUTEROL SULFATE (2.5 MG/3ML) 0.083% IN NEBU
2.5000 mg | INHALATION_SOLUTION | Freq: Four times a day (QID) | RESPIRATORY_TRACT | Status: DC | PRN
  Administered 2016-12-12: 2.5 mg via RESPIRATORY_TRACT
  Filled 2016-12-12: qty 3

## 2016-12-12 MED ORDER — MAGNESIUM SULFATE 2 GM/50ML IV SOLN
2.0000 g | Freq: Once | INTRAVENOUS | Status: AC
  Administered 2016-12-12: 2 g via INTRAVENOUS
  Filled 2016-12-12: qty 50

## 2016-12-12 NOTE — Progress Notes (Signed)
ANTIBIOTIC CONSULT NOTE - INITIAL  Pharmacy Consult for Vancomycin  Indication: bacteremia  Allergies  Allergen Reactions  . Asa [Aspirin]   . Avelox [Moxifloxacin]   . Cephalexin   . Codeine   . Erythromycin Ethylsuccinate   . Flagyl [Metronidazole]   . Latex   . Levaquin [Levofloxacin] Hives  . Meloxicam   . Shellfish Allergy Nausea And Vomiting and Other (See Comments)    Dizzy, fainting  . Sulfa Antibiotics   . Topiramate   . Doxycycline Rash  . Tetracyclines & Related Rash    Patient Measurements: Height: _0  (167.6 cm) Weight: 132 lb 0.9 oz (59.9 kg) IBW/kg (Calculated) : 59.3 Adjusted Body Weight:   Vital Signs: Temp: 99.1 F (37.3 C) (05/24 1630) BP: 82/57 (05/24 1630) Pulse Rate: 91 (05/24 1630) Intake/Output from previous day: 05/23 0701 - 05/24 0700 In: 3625.6 [I.V.:425.6; IV Piggyback:3200] Out: 350 [Urine:350] Intake/Output from this shift: No intake/output data recorded.  Labs:  Recent Labs  12/11/16 2251 12/11/16 2252 12/12/16 0303  WBC  --  11.7* 10.2  HGB  --  11.2* 10.2*  PLT  --  215 177  CREATININE 0.68  --  0.68   Estimated Creatinine Clearance: 63.9 mL/min (by C-G formula based on SCr of 0.68 mg/dL). No results for input(s): VANCOTROUGH, VANCOPEAK, VANCORANDOM, GENTTROUGH, GENTPEAK, GENTRANDOM, TOBRATROUGH, TOBRAPEAK, TOBRARND, AMIKACINPEAK, AMIKACINTROU, AMIKACIN in the last 72 hours.   Microbiology: Recent Results (from the past 720 hour(s))  Blood Culture (routine x 2)     Status: None (Preliminary result)   Collection Time: 12/11/16 10:52 PM  Result Value Ref Range Status   Specimen Description BLOOD RT HAND  Final   Special Requests   Final    BOTTLES DRAWN AEROBIC AND ANAEROBIC Blood Culture adequate volume   Culture  Setup Time   Final    Organism ID to follow GRAM POSITIVE COCCI ANAEROBIC BOTTLE ONLY CRITICAL RESULT CALLED TO, READ BACK BY AND VERIFIED WITH: Lupe Bonner AT 1808 12/12/2016 BY TFK.    Culture GRAM  POSITIVE COCCI  Final   Report Status PENDING  Incomplete  Blood Culture (routine x 2)     Status: None (Preliminary result)   Collection Time: 12/11/16 10:52 PM  Result Value Ref Range Status   Specimen Description BLOOD L FA  Final   Special Requests BOTTLES DRAWN AEROBIC AND ANAEROBIC BCHV  Final   Culture NO GROWTH < 12 HOURS  Final   Report Status PENDING  Incomplete  Blood Culture ID Panel (Reflexed)     Status: Abnormal   Collection Time: 12/11/16 10:52 PM  Result Value Ref Range Status   Enterococcus species NOT DETECTED NOT DETECTED Final   Listeria monocytogenes NOT DETECTED NOT DETECTED Final   Staphylococcus species DETECTED (A) NOT DETECTED Final    Comment: Methicillin (oxacillin) resistant coagulase negative staphylococcus. Possible blood culture contaminant (unless isolated from more than one blood culture draw or clinical case suggests pathogenicity). No antibiotic treatment is indicated for blood  culture contaminants. CRITICAL RESULT CALLED TO, READ BACK BY AND VERIFIED WITH: Kenzo Ozment AT 1808 12/12/2016 BY TFK.    Staphylococcus aureus NOT DETECTED NOT DETECTED Final   Methicillin resistance DETECTED (A) NOT DETECTED Final    Comment: CRITICAL RESULT CALLED TO, READ BACK BY AND VERIFIED WITH: Jaira Canady AT 7026 12/12/2016 BY TFK.    Streptococcus species NOT DETECTED NOT DETECTED Final   Streptococcus agalactiae NOT DETECTED NOT DETECTED Final   Streptococcus pneumoniae NOT DETECTED NOT DETECTED  Final   Streptococcus pyogenes NOT DETECTED NOT DETECTED Final   Acinetobacter baumannii NOT DETECTED NOT DETECTED Final   Enterobacteriaceae species NOT DETECTED NOT DETECTED Final   Enterobacter cloacae complex NOT DETECTED NOT DETECTED Final   Escherichia coli NOT DETECTED NOT DETECTED Final   Klebsiella oxytoca NOT DETECTED NOT DETECTED Final   Klebsiella pneumoniae NOT DETECTED NOT DETECTED Final   Proteus species NOT DETECTED NOT DETECTED Final   Serratia  marcescens NOT DETECTED NOT DETECTED Final   Haemophilus influenzae NOT DETECTED NOT DETECTED Final   Neisseria meningitidis NOT DETECTED NOT DETECTED Final   Pseudomonas aeruginosa NOT DETECTED NOT DETECTED Final   Candida albicans NOT DETECTED NOT DETECTED Final   Candida glabrata NOT DETECTED NOT DETECTED Final   Candida krusei NOT DETECTED NOT DETECTED Final   Candida parapsilosis NOT DETECTED NOT DETECTED Final   Candida tropicalis NOT DETECTED NOT DETECTED Final  MRSA PCR Screening     Status: None   Collection Time: 12/12/16  2:17 AM  Result Value Ref Range Status   MRSA by PCR NEGATIVE NEGATIVE Final    Comment:        The GeneXpert MRSA Assay (FDA approved for NASAL specimens only), is one component of a comprehensive MRSA colonization surveillance program. It is not intended to diagnose MRSA infection nor to guide or monitor treatment for MRSA infections.     Medical History: Past Medical History:  Diagnosis Date  . Cancer (HCC)    squamous cell, vaginal  . COPD (chronic obstructive pulmonary disease) ([place])   . Diabetes mellitus without complication ([place])   . Pneumonia     Medications:  Scheduled:  . budesonide (PULMICORT) nebulizer solution  0.25 mg Nebulization Q6H  . chlorhexidine gluconate (MEDLINE KIT)  15 mL Mouth Rinse BID  . enoxaparin (LOVENOX) injection  40 mg Subcutaneous Q24H  . free water  200 mL Per Tube Q8H  . insulin aspart  0-15 Units Subcutaneous Q4H  . ipratropium-albuterol  3 mL Nebulization Q6H  . mouth rinse  15 mL Mouth Rinse QID  . methylPREDNISolone (SOLU-MEDROL) injection  40 mg Intravenous Q12H  . pantoprazole (PROTONIX) IV  40 mg Intravenous Q24H   Assessment: Pt growing Staph species in 1 of 4 bottles (anaerobic), Mech A positive.  CrCl = 63.9 ml/min Ke = 0.0574 hr-1 T1/2 = 12 hrs   Goal of Therapy:  Vancomycin trough level 15-20 mcg/ml  Plan:  Expected duration 7 days with resolution of temperature and/or normalization  of WBC   Vancomycin 1 gm IV X 1 ordered to be given on 5/24 @ 20:00. Vancomycin 1 gm IV Q12H ordered to start on 5/25 @ 0400. This pt will reach Css on 5/27 @ 0800. Will draw 1st trough on 05/27 @ 03:30, which will be very close to Css.   Besnik Febus D 12/12/2016,7:25 PM

## 2016-12-12 NOTE — Progress Notes (Signed)
Pharmacy Antibiotic Note  Tiffany Mclean is a 66 y.o. female admitted on 12/11/2016 with pneumonia.  Pharmacy has been consulted for vanc/meropenem dosing.  Plan: Patient received vanc 1g IV x 1  Will follow up w/ a maintenance of vanc 1g IV q12h w/ 8 hour stack dose. Will draw a trough 5/25 @ 2100 prior to 4th dose. Ke 0.0574 T1/2 12 hours Goal trough 15 - 20 mcg/mL Css 20 mcg/mL  Will start meropenem 1g IV q8h   Height: 5\' 6"  (167.6 cm) Weight: 132 lb 0.9 oz (59.9 kg) IBW/kg (Calculated) : 59.3  Temp (24hrs), Avg:96.8 F (36 C), Min:96.6 F (35.9 C), Max:97 F (36.1 C)   Recent Labs Lab 12/11/16 2251 12/11/16 2252 12/12/16 0303  WBC  --  11.7* 10.2  CREATININE 0.68  --   --   LATICACIDVEN  --  3.5*  --     Estimated Creatinine Clearance: 63.9 mL/min (by C-G formula based on SCr of 0.68 mg/dL).    Allergies  Allergen Reactions  . Asa [Aspirin]   . Avelox [Moxifloxacin]   . Cephalexin   . Codeine   . Erythromycin Ethylsuccinate   . Flagyl [Metronidazole]   . Latex   . Levaquin [Levofloxacin] Hives  . Meloxicam   . Shellfish Allergy Nausea And Vomiting and Other (See Comments)    Dizzy, fainting  . Sulfa Antibiotics   . Topiramate   . Doxycycline Rash  . Tetracyclines & Related Rash     Thank you for allowing pharmacy to be a part of this patient's care.  Tobie Lords, PharmD, BCPS Clinical Pharmacist 12/12/2016

## 2016-12-12 NOTE — Progress Notes (Signed)
Pt was transported from ER to CT then to ICU on vent without incident at approx 0225. Pt remains on vent and is tol well.

## 2016-12-12 NOTE — Consult Note (Addendum)
PULMONARY / CRITICAL CARE MEDICINE   Name: Tiffany Mclean MRN: [id number] DOB: 02/22/1951    ADMISSION DATE:  12/11/2016   CONSULTATION DATE:  12/11/2016  REFERRING MD:  [place]  Reason: Acute hypercarbic respiratory failure  CHIEF COMPLAINT:  Unresponsive  HISTORY OF PRESENT ILLNESS:  This is a 66 year old Caucasian female with a past medical history as indicated below presented to the ED via EMS with complaints of altered mental status 3 days. History is obtained from ED records as patient is currently intubated. Per ED records, patient's husband noted that she was not acting herself about 3 days ago. She called her primary care office yesterday morning and reported that patient was acting more confused, but will not go to the clinic to be evaluated. A prescription for antibiotics for UTI was called to the patient's pharmacy. Last evening, patient suddenly became unresponsive after he gave her the first dose of antibiotics. Upon EMS arrival, patient's SPO2 was 70% on room air. Patient uses 3 L nasal cannula at home. She was placed on a nonrebreather mask and her SPO2 increased to 88%. Upon arrival in the ED, patient was unresponsive, hence, she was emergently intubated. She was hypotensive with a blood pressure of 66/41 tachycardic with a heart rate of 137, lactic acid level of 3.5 and borderline WBC of 11.7. Code sepsis was called.  ABG postintubation's showed pH of 7.0, PCO2 of 120, PO2 of 156. PCCM was consulted for ICU management.   PAST MEDICAL HISTORY :  She  has a past medical history of Cancer ([place]); COPD (chronic obstructive pulmonary disease) ([place]); Diabetes mellitus without complication ([place]); and Pneumonia.  PAST SURGICAL HISTORY: She  has a past surgical history that includes Cholecystectomy (12/20/2010); Abdominal hysterectomy (1996); Tubal ligation (1986); Breast surgery (1983); Eye surgery; Cervical spine surgery (1996, 1993); and Breast enhancement  surgery.  Allergies  Allergen Reactions  . Asa [Aspirin]   . Avelox [Moxifloxacin]   . Cephalexin   . Codeine   . Erythromycin Ethylsuccinate   . Flagyl [Metronidazole]   . Latex   . Levaquin [Levofloxacin] Hives  . Meloxicam   . Shellfish Allergy Nausea And Vomiting and Other (See Comments)    Dizzy, fainting  . Sulfa Antibiotics   . Topiramate   . Doxycycline Rash  . Tetracyclines & Related Rash    No current facility-administered medications on file prior to encounter.    Current Outpatient Prescriptions on File Prior to Encounter  Medication Sig  . albuterol (ACCUNEB) 1.25 MG/3ML nebulizer solution Take 1 ampule by nebulization 4 (four) times daily as needed for shortness of breath.  . ALPRAZolam (XANAX) 0.25 MG tablet Take 1 tablet (0.25 mg total) by mouth every 6 (six) hours as needed. (Patient taking differently: Take 0.25 mg by mouth every 6 (six) hours as needed for anxiety. )  . amoxicillin (AMOXIL) 500 MG capsule Take 2 capsules (1,000 mg total) by mouth 2 (two) times daily.  Marland Kitchen atorvastatin (LIPITOR) 40 MG tablet TAKE ONE TABLET BY MOUTH ONCE DAILY  . DULoxetine (CYMBALTA) 30 MG capsule TAKE 3 CAPSULES (90 MG TOTAL) BY MOUTH DAILY.  Marland Kitchen guaiFENesin-dextromethorphan (ROBITUSSIN DM) 100-10 MG/5ML syrup Take 5 mLs by mouth every 6 (six) hours as needed for cough.  Marland Kitchen ipratropium (ATROVENT) 0.02 % nebulizer solution Take 0.5 mg by nebulization every 6 (six) hours as needed for wheezing or shortness of breath.  Marland Kitchen ipratropium-albuterol (DUONEB) 0.5-2.5 (3) MG/3ML SOLN Take 3 mLs by nebulization every 4 (four) hours as needed.  Marland Kitchen  morphine (KADIAN) 60 MG 24 hr capsule Take 1 capsule (60 mg total) by mouth daily.  . RABEprazole (ACIPHEX) 20 MG tablet Take 1 tablet (20 mg total) by mouth daily.  Marland Kitchen tiotropium (SPIRIVA HANDIHALER) 18 MCG inhalation capsule Place 1 capsule (18 mcg total) into inhaler and inhale daily.  . VENTOLIN HFA 108 (90 Base) MCG/ACT inhaler INHALE 2 PUFFS INTO  THE LUNGS EVERY 6 (SIX) HOURS AS NEEDED FOR WHEEZING OR SHORTNESS OF BREATH.    FAMILY HISTORY:  Her indicated that the status of her mother is unknown. She indicated that the status of her father is unknown.    SOCIAL HISTORY: She  reports that she quit smoking about 8 years ago. Her smoking use included Cigarettes. She has a 45.00 pack-year smoking history. She quit smokeless tobacco use about 8 years ago. She reports that she does not drink alcohol or use drugs.  REVIEW OF SYSTEMS:   Unable to obtain as patient is currently intubated  SUBJECTIVE:   VITAL SIGNS: BP (!) 147/73 (BP Location: Left Arm)   Pulse (!) 107   Temp (!) 96.6 F (35.9 C) (Other (Comment)) Comment (Src): Temp foley  Resp 18   Ht 5\' 4"  (1.626 m)   Wt 132 lb (59.9 kg)   SpO2 100%   BMI 22.66 kg/m   HEMODYNAMICS:    VENTILATOR SETTINGS: Vent Mode: AC FiO2 (%):  [50 %-80 %] 80 % Set Rate:  [18 bmp] 18 bmp Vt Set:  [450 mL] 450 mL PEEP:  [5 cmH20] 5 cmH20  INTAKE / OUTPUT: No intake/output data recorded.  PHYSICAL EXAMINATION: General:  Acutely ill-looking, restless Neuro:  Sedated, moves upper and lower extremities HEENT:  PERRLA, trachea midline, no JVD Cardiovascular:  Tachycardic,  rhythm regular, S1, S2 audible, no murmur reculture gallop, no edema, +2 pulses bilaterally Lungs: Diminished breath sounds in all lung fields, no wheezes or rhonchi Abdomen: Soft, nontender, nondistended, palpation reveals no organomegaly, positive bowel sounds, no focal Musculoskeletal:  No joint deformities Skin: Warm and dry  LABS:  BMET  Recent Labs Lab 12/11/16 2251  NA 144  K 4.6  CL 92*  CO2 43*  BUN 33*  CREATININE 0.68  GLUCOSE 178*    Electrolytes  Recent Labs Lab 12/11/16 2251  CALCIUM 9.8  MG 1.8    CBC  Recent Labs Lab 12/11/16 2252  WBC 11.7*  HGB 11.2*  HCT 35.5  PLT 215    Coag's No results for input(s): APTT, INR in the last 168 hours.  Sepsis Markers  Recent  Labs Lab 12/11/16 2252  LATICACIDVEN 3.5*    ABG No results for input(s): PHART, PCO2ART, PO2ART in the last 168 hours.  Liver Enzymes  Recent Labs Lab 12/11/16 2251  AST 58*  ALT 31  ALKPHOS 100  BILITOT 0.9  ALBUMIN 3.9    Cardiac Enzymes No results for input(s): TROPONINI, PROBNP in the last 168 hours.  Glucose No results for input(s): GLUCAP in the last 168 hours.  Imaging Dg Chest Portable 1 View  Result Date: 12/12/2016 CLINICAL DATA:  Post intubation EXAM: PORTABLE CHEST 1 VIEW COMPARISON:  12/11/2016 FINDINGS: Interval placement of an endotracheal tube with tip measuring 5.3 cm above the carina. Heart size and pulmonary vascularity are normal. Emphysematous changes in the lungs with scattered fibrosis. No focal consolidation or airspace disease. No blunting of costophrenic angles. No pneumothorax. Postoperative changes in the cervical spine. IMPRESSION: Endotracheal tube tip measures 5.3 cm above the carina, in good apparent location.  Electronically Signed   By: Lucienne Capers M.D.   On: 12/12/2016 00:39   Dg Chest Port 1 View  Result Date: 12/12/2016 CLINICAL DATA:  Altered mental status EXAM: PORTABLE CHEST 1 VIEW COMPARISON:  04/17/2016 FINDINGS: Cervical spine hardware. Hyperinflation with emphysematous disease and bullous change in the right greater than left upper lobes. No acute consolidation or effusion. Stable cardiomediastinal silhouette. No pneumothorax. IMPRESSION: Hyperinflation with emphysematous disease. No acute infiltrate or edema Electronically Signed   By: Donavan Foil M.D.   On: 12/12/2016 00:11    STUDIES: None  CULTURES: Blood cultures 2. Urine culture  ANTIBIOTICS: Vancomycin 05/241 Meropenem 05/24 >>   SIGNIFICANT EVENTS: 12/12/16: Admitted with acute hypercarbic respiratory failure  LINES/TUBES: Peripheral IVs  DISCUSSION: This is a 66 year old Caucasian female presenting with acute hypercarbic/hypoxic respiratory failure,  sepsis, septic shock, acute encephalopathy and lactic acidosis  ASSESSMENT / PLAN:  PULMONARY A: Acute hypercarbic/hypoxic respiratory failure Acute on chronic COPD exacerbation Pulmonary embolism ruled out P:   Full vent support with current settings ABG postintubation reviewed, vent changes made. Chest x-ray and ABG when necessary Nebulized bronchodilator and steroids VAP Protocol  CARDIOVASCULAR A:  Hypotension-resolved with fluid resuscitation Elevated lactate P:  IV fluids Hemodynamic monitoring per ICU We'll hold off on central venous catheter since patient's blood pressure stable  GASTROINTESTINAL A:   No acute issues P:   Keep nothing by mouth PPI for GI prophylaxis  HEMATOLOGIC A:   Mild anemia P:  Trend CBC and transfuse for hemoglobin less than 7  INFECTIOUS A:   Possible sepsis-source unknown P:   Broad-spectrum antibiotics. Follow-up blood and urine cultures Trend pro-calcitonin  ENDOCRINE A:   History of type 2 diabetes P:   Blood glucose monitoring with sliding scale insulin coverage  NEUROLOGIC A:   Acute hypercarbic and metabolic encephalopathy P:   RASS goal: -1 to -2 Fentanyl and Versed for vent discomfort Wakeup assessment per protocol  FAMILY  - Updates: No family at bedside. We'll updated when available  - Inter-disciplinary family meet or Palliative Care meeting due by:  day 7  Plan of care discussed with a Link physician  [place]. [facility] ANP-BC Pulmonary and [place] Pager [phone] or [phone] 12/12/2016, 1:15 AM    PCCM ATTENDING ATTESTATION: I have evaluated patient with the APP [place], reviewed database in its entirety and discussed care plan in detail. In addition, this patient was discussed on multidisciplinary rounds.   Important exam findings: Intubated Tremulous - improved after increased propofol Chronically ill-appearing HEENT WNL Diffuse scattered wheezes with  extremely prolonged expiration Regular, no M Abdomen slightly distended, diminished bowel sounds, no rebound tenderness Extremities warm  Major problems addressed by PCCM team: Acute on chronic hypoxic/hypercarbic respiratory failure End-stage COPD Elevated lactate - initially labeled as severe sepsis. However, there is no convincing evidence of sepsis  EPP:IRJJOA COPD, no acute infiltrate  The laboratory data above has been reviewed in detail by me   PLAN/REC: Cont full vent support - settings reviewed and/or adjusted Cont vent bundle Daily SBT if/when meets criteria Monitor BMET intermittently Monitor I/Os Correct electrolytes as indicated Monitor temp, WBC count Micro and abx as above Following PCT Recheck lactate in morning. If remains elevated consider further evaluation including evaluation for possible bowel ischemia  I spoke at length with the patient's husband. He informs me that the patient was intubated approximately 2-3 years ago and since that time has been adamant that she not undergo intubation or ACLS again.  He was unable to make the decision for DNR at the moment of crisis in the emergency department. Therefore she was intubated to provide the necessary support. He understands the end-stage nature of her COPD and describes a very limited functional status at her baseline. However, he emotionally he is having difficulty coping with this reality. I emphasized the importance of honoring her previously defined wishes. We established the following plan:  DNR now Maintaining comfort will be our highest priority no major escalation in her level of support short-term mechanical ventilation only (I suggested 3-4 days at most) Palliative care consultation to meet with husband and other family members and discuss options of care.  CCM time: 45 mins The above time includes time spent in consultation with patient and/or family members and reviewing care plan on  multidisciplinary rounds  Merton Border, MD PCCM service Mobile [phone] Pager [phone] 12/12/2016 4:45 PM

## 2016-12-12 NOTE — Progress Notes (Addendum)
Patient's LA increased to 6.2. [name], NP notified and gave verbal for 1L IVF bolus. Order placed and bolus started. Per NP patient to have PICC line placed. Orders placed. [name]

## 2016-12-12 NOTE — H&P (Signed)
History and Physical   [facility] [place] @ [facility] Admission History and Physical [employer], D.O.    Patient Name: Tiffany Mclean MR#: [id number] Date of Birth: 09-08-1950 Date of Admission: 12/11/2016  Referring MD/NP/PA: Dr. Quentin Cornwall Primary Care Physician: Birdie Sons, MD Patient coming from: Home Outpatient Specialists: Dr. Ubaldo Glassing   Chief Complaint:  Chief Complaint  Patient presents with  . Altered Mental Status  Please note the entire history is obtained from the patient's emergency department chart, emergency department provider and the patient's family who is at the bedside. Patient's personal history is limited by sedation, intubation.   HPI: Tiffany Mclean is a 66 y.o. 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  Patient's husband states that over the past 2-3 months she has had increased O2 requirement from 2-3 L around the clock. The last time she was seen normal was 3 days ago.. Over the past 3 days she has had worsening change in mental status including confusion. Today her husband been obtained amoxicillin for treatment of a presumed urinary tract infection. He was able to give her 1 dose but subsequently found her unresponsive at home. She was found to be hypoxic in the mid 70s on room air and was placed on a nonrebreather by EMS which raised her sats to 88%. In the emergency department she was intubated secondary to hypoxia with hypercapnia.    Patient's husband denies any focal symptoms of infection such as dysuria, diarrhea, abdominal pain, cough, fever, shortness of breath (worse than usual).  Of note patient is bedbound since 2-3 years ago. She had a prolonged hospitalization followed by a rehabilitation stay after which she developed C. difficile and  MRSA.   EMS/ED Course: Patient received Merrem, vancomycin, normal saline, DuoNeb's, magnesium, Zofran.  Review of Systems:  Unable to obtain secondary to intubation and sedation   Past Medical History:  Diagnosis Date  . Cancer (HCC)    squamous cell, vaginal  . COPD (chronic obstructive pulmonary disease) ([place])   . Diabetes mellitus without complication ([place])   . Pneumonia     Past Surgical History:  Procedure Laterality Date  . ABDOMINAL HYSTERECTOMY  1996   BSO. Dr. Ammie Dalton  . BREAST ENHANCEMENT SURGERY     rupture  . BREAST SURGERY  1983   Breast Augmentation  . [place]  . CHOLECYSTECTOMY  12/20/2010   Laparoscopic, Dr. Marina Gravel, [facility]  . EYE SURGERY     1977  . TUBAL LIGATION  1986     reports that she quit smoking about 8 years ago. Her smoking use included Cigarettes. She has a 45.00 pack-year smoking history. She quit smokeless tobacco use about 8 years ago. She reports that she does not drink alcohol or use drugs.  Allergies  Allergen Reactions  . Asa [Aspirin]   . Avelox [Moxifloxacin]   . Cephalexin   . Codeine   . Erythromycin Ethylsuccinate   . Flagyl [Metronidazole]   . Latex   . Levaquin [Levofloxacin] Hives  . Meloxicam   . Shellfish Allergy Nausea And Vomiting and Other (See Comments)    Dizzy, fainting  . Sulfa Antibiotics   . Topiramate   . Doxycycline Rash  . Tetracyclines & Related Rash    Family History  Problem Relation Age of Onset  .  Gout Mother   . Hypertension Mother   . Arthritis Mother   . Stroke Mother   . Kidney disease Mother   . CAD Mother   . Emphysema Father   . Gout Father   . Hypertension Father   . Kidney disease Father   . Arthritis Father   . Ulcers Father     Prior to Admission medications   Medication Sig Start Date End Date Taking? Authorizing Provider  acetaminophen (TYLENOL) 325 MG tablet Take 650 mg by mouth every 6 (six) hours as needed for mild pain or fever.   Yes  [provider]  albuterol (ACCUNEB) 1.25 MG/3ML nebulizer solution Take 1 ampule by nebulization 4 (four) times daily as needed for shortness of breath.   Yes [provider]  ALPRAZolam (XANAX) 0.25 MG tablet Take 1 tablet (0.25 mg total) by mouth every 6 (six) hours as needed. Patient taking differently: Take 0.25 mg by mouth every 6 (six) hours as needed for anxiety.  06/21/16  Yes Birdie Sons, MD  amoxicillin (AMOXIL) 500 MG capsule Take 2 capsules (1,000 mg total) by mouth 2 (two) times daily. 12/11/16 12/21/16 Yes Birdie Sons, MD  atorvastatin (LIPITOR) 40 MG tablet TAKE ONE TABLET BY MOUTH ONCE DAILY 09/26/16  Yes Birdie Sons, MD  docusate sodium (COLACE) 100 MG capsule Take 100 mg by mouth 2 (two) times daily as needed for mild constipation.   Yes [provider]  DULoxetine (CYMBALTA) 30 MG capsule TAKE 3 CAPSULES (90 MG TOTAL) BY MOUTH DAILY. 10/01/16  Yes Birdie Sons, MD  guaiFENesin-dextromethorphan (ROBITUSSIN DM) 100-10 MG/5ML syrup Take 5 mLs by mouth every 6 (six) hours as needed for cough. 04/21/16  Yes Theodoro Grist, MD  ipratropium (ATROVENT) 0.02 % nebulizer solution Take 0.5 mg by nebulization every 6 (six) hours as needed for wheezing or shortness of breath.   Yes [provider]  ipratropium-albuterol (DUONEB) 0.5-2.5 (3) MG/3ML SOLN Take 3 mLs by nebulization every 4 (four) hours as needed. 04/21/16  Yes Theodoro Grist, MD  meclizine (ANTIVERT) 12.5 MG tablet Take 12.5 mg by mouth 3 (three) times daily as needed for dizziness.   Yes [provider]  morphine (KADIAN) 60 MG 24 hr capsule Take 1 capsule (60 mg total) by mouth daily. 11/22/16  Yes Birdie Sons, MD  Potassium 99 MG TABS Take 1 tablet by mouth daily.   Yes [provider]  RABEprazole (ACIPHEX) 20 MG tablet Take 1 tablet (20 mg total) by mouth daily. 05/23/15  Yes Birdie Sons, MD  sodium chloride (OCEAN) 0.65 % SOLN nasal spray Place 1 spray  into both nostrils as needed for congestion.   Yes [provider]  tiotropium (SPIRIVA HANDIHALER) 18 MCG inhalation capsule Place 1 capsule (18 mcg total) into inhaler and inhale daily. 08/19/16  Yes Birdie Sons, MD  VENTOLIN HFA 108 [phone] Base) MCG/ACT inhaler INHALE 2 PUFFS INTO THE LUNGS EVERY 6 (SIX) HOURS AS NEEDED FOR WHEEZING OR SHORTNESS OF BREATH. 10/24/16  Yes Mar Daring, [place]    Physical Exam: Vitals:   12/11/16 2246 12/11/16 2304 12/12/16 0000 12/12/16 0025  BP:  (!) 66/41 113/84 124/66  Pulse:  (!) 108 (!) 140 (!) 107  Resp:  19 (!) 22 19  Temp:    (!) 96.7 F (35.9 C)  TempSrc:    Other (Comment)  SpO2:  100% 100% (!) 89%  Weight: 59.9 kg (132 lb)     Height:  5\' 4"  (1.626 m)       GENERAL: 66 y.o.-year-old Female patient, chronically ill-appearing. Sedated, intubated. HEENT: Head atraumatic, normocephalic. Pupils equal, round, reactive to light and accommodation. Mucus membranes moist. NECK: Supple. No JVD, no bruit heard. No thyroid enlargement, no tenderness, no cervical lymphadenopathy. CHEST: Mostly clear, slight bibasilar rhonchi and no wheezing. CARDIOVASCULAR: S1, S2 normal. No murmurs, rubs, or gallops. Cap refill <2 seconds. Pulses intact distally.  ABDOMEN: Soft, nondistended, nontender. No rebound, guarding, rigidity. Normoactive bowel sounds present in all four quadrants. No organomegaly or mass. Foley in place with 200cc clear yellow urine.  EXTREMITIES: Bilateral calf atrophy. No pedal edema, cyanosis, or clubbing. Cannot fully assess for calf tenderness secondary to sedation NEUROLOGIC: The patient is sedated, intubated. Cannot comply with exam. SKIN: Warm, dry, and intact without obvious rash, lesion, or ulcer.    Labs on Admission:  CBC:  Recent Labs Lab 12/11/16 2252  WBC 11.7*  NEUTROABS 9.7*  HGB 11.2*  HCT 35.5  MCV 89.0  PLT 401   Basic Metabolic Panel:  Recent Labs Lab 12/11/16 2251  NA 144  K 4.6  CL 92*   CO2 43*  GLUCOSE 178*  BUN 33*  CREATININE 0.68  CALCIUM 9.8  MG 1.8   GFR: Estimated Creatinine Clearance: 58.9 mL/min (by C-G formula based on SCr of 0.68 mg/dL). Liver Function Tests:  Recent Labs Lab 12/11/16 2251  AST 58*  ALT 31  ALKPHOS 100  BILITOT 0.9  PROT 7.5  ALBUMIN 3.9   No results for input(s): LIPASE, AMYLASE in the last 168 hours. No results for input(s): AMMONIA in the last 168 hours. Coagulation Profile: No results for input(s): INR, PROTIME in the last 168 hours. Cardiac Enzymes: No results for input(s): CKTOTAL, CKMB, CKMBINDEX, TROPONINI in the last 168 hours. BNP (last 3 results) No results for input(s): PROBNP in the last 8760 hours. HbA1C: No results for input(s): HGBA1C in the last 72 hours. CBG: No results for input(s): GLUCAP in the last 168 hours. Lipid Profile: No results for input(s): CHOL, HDL, LDLCALC, TRIG, CHOLHDL, LDLDIRECT in the last 72 hours. Thyroid Function Tests: No results for input(s): TSH, T4TOTAL, FREET4, T3FREE, THYROIDAB in the last 72 hours. Anemia Panel: No results for input(s): VITAMINB12, FOLATE, FERRITIN, TIBC, IRON, RETICCTPCT in the last 72 hours. Urine analysis:    Component Value Date/Time   COLORURINE YELLOW (A) 12/11/2016 2252   APPEARANCEUR CLOUDY (A) 12/11/2016 2252   APPEARANCEUR Clear 06/12/2016 1100   LABSPEC 1.015 12/11/2016 2252   LABSPEC 1.026 10/23/2014 1634   PHURINE 5.0 12/11/2016 2252   GLUCOSEU NEGATIVE 12/11/2016 2252   GLUCOSEU Negative 10/23/2014 1634   HGBUR SMALL (A) 12/11/2016 2252   BILIRUBINUR NEGATIVE 12/11/2016 2252   BILIRUBINUR Negative 06/12/2016 1100   BILIRUBINUR Negative 10/23/2014 1634   KETONESUR NEGATIVE 12/11/2016 2252   PROTEINUR 100 (A) 12/11/2016 2252   NITRITE NEGATIVE 12/11/2016 2252   LEUKOCYTESUR NEGATIVE 12/11/2016 2252   LEUKOCYTESUR Negative 06/12/2016 1100   LEUKOCYTESUR Trace 10/23/2014 1634   Sepsis  Labs: @LABRCNTIP (procalcitonin:4,lacticidven:4) )No results found for this or any previous visit (from the past 240 hour(s)).   Radiological Exams on Admission: Dg Chest Portable 1 View  Result Date: 12/12/2016 CLINICAL DATA:  Post intubation EXAM: PORTABLE CHEST 1 VIEW COMPARISON:  12/11/2016 FINDINGS: Interval placement of an endotracheal tube with tip measuring 5.3 cm above the carina. Heart size and pulmonary vascularity are normal. Emphysematous changes in the lungs with scattered fibrosis. No focal consolidation or airspace  disease. No blunting of costophrenic angles. No pneumothorax. Postoperative changes in the cervical spine. IMPRESSION: Endotracheal tube tip measures 5.3 cm above the carina, in good apparent location. Electronically Signed   By: Lucienne Capers M.D.   On: 12/12/2016 00:39   Dg Chest Port 1 View  Result Date: 12/12/2016 CLINICAL DATA:  Altered mental status EXAM: PORTABLE CHEST 1 VIEW COMPARISON:  04/17/2016 FINDINGS: Cervical spine hardware. Hyperinflation with emphysematous disease and bullous change in the right greater than left upper lobes. No acute consolidation or effusion. Stable cardiomediastinal silhouette. No pneumothorax. IMPRESSION: Hyperinflation with emphysematous disease. No acute infiltrate or edema Electronically Signed   By: Donavan Foil M.D.   On: 12/12/2016 00:11    EKG: Sinus tachycardia at 119 bpm with normal axis and nonspecific ST-T wave changes.   Assessment/Plan  This is a 66 y.o. 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 Acute respiratory failure with hypoxia and hypercapnia, in a patient with home-O2 dependent COPD.  DDX: CAP,  COPD exac, PE -Admit to ICU, telemetry monitoring, continuous pulse ox -Patient is sedated and intubated, management per CCM -Stat CTA to rule out PE - Bair Hugger for  hypothyermia - Antipyretics and pain control - Foley catheter in place secondary to sedation/intubation.  #. Sepsis, unclear source - IV antibiotics: Merrem, Vanco - IV fluid hydration - Follow up blood,urine & sputum cultures - Repeat CBC in am.   #. History of fibromyalgia/depression -Hold Cymbalta - Chronic opiate use  #. History of hyperlipidemia - hold Lipitor  #. History of GERD - hold AcipHex  Admission status:  Inpatient, ICU IV Fluids: NS Diet/Nutrition: NPO Consults called: CCM  DVT Px: Lovenox, SCDs and early ambulation. Code Status: Full Code discussed with husband. At this point he would like for her to be intubated and resuscitated. Patient was a full code on prior hospitalizations as well.  Disposition Plan: To be determined  All the records are reviewed and case discussed with ED provider. Management plans discussed with the patient and/or family who express understanding and agree with plan of care.  Kelli Egolf D.O. on 12/12/2016 at 12:48 AM Between 7am to 6pm - Pager - [phone] After 6pm go to [email or web address] - [employer] [facility] [place] [facility] Office [phone] CC: Primary care physician; Birdie Sons, MD   12/12/2016, 12:48 AM

## 2016-12-12 NOTE — Progress Notes (Signed)
Initial Nutrition Assessment  DOCUMENTATION CODES:   Not applicable  INTERVENTION:  1. If tubefeeding is initiated, begin Vital 1.2 @ 73mL/hr via OGT, Provides 1584 calories, 99 grams protein, and 1071cc free water  NUTRITION DIAGNOSIS:   Inadequate oral intake related to inability to eat as evidenced by NPO status.  GOAL:   Patient will meet greater than or equal to 90% of their needs  MONITOR:   Labs, Weight trends, I & O's, Vent status, TF tolerance  REASON FOR ASSESSMENT:   Ventilator    ASSESSMENT:   This is a 66 year old Caucasian female presenting with acute hypercarbic/hypoxic respiratory failure, sepsis, septic shock, acute encephalopathy and lactic acidosis  Patient is currently intubated on ventilator support MV: 11.2 L/min Temp (24hrs), Avg:98.6 F (37 C), Min:96.6 F (35.9 C), Max:100.9 F (38.3 C) Propofol: 7.2 ml/hr --> 190 calories Discussed in Rounds - End Stage COPD Labs and medications reviewed: CBGs 149-170, Phos 2.1 NS @ 113mL/hr, Fentanyl gtt  Diet Order:  Diet NPO time specified  Skin:  Reviewed, no issues  Last BM:  12/11/2016  Height:   Ht Readings from Last 1 Encounters:  12/12/16 5\' 6"  (1.676 m)    Weight:   Wt Readings from Last 1 Encounters:  12/12/16 132 lb 0.9 oz (59.9 kg)    Ideal Body Weight:  59.09 kg  BMI:  Body mass index is 21.31 kg/m.  Estimated Nutritional Needs:   Kcal:  1640.4  Protein:  72-90 gm protein  Fluid:  >/= 1.6L  EDUCATION NEEDS:   Education needs no appropriate at this time  Satira Anis. Shatha Hooser, MS, RD LDN Inpatient Clinical Dietitian Pager [phone]

## 2016-12-12 NOTE — Progress Notes (Signed)
PHARMACY - PHYSICIAN COMMUNICATION CRITICAL VALUE ALERT - BLOOD CULTURE IDENTIFICATION (BCID)  Results for orders placed or performed during the hospital encounter of 12/11/16  Blood Culture ID Panel (Reflexed) (Collected: 12/11/2016 10:52 PM)  Result Value Ref Range   Enterococcus species NOT DETECTED NOT DETECTED   Listeria monocytogenes NOT DETECTED NOT DETECTED   Staphylococcus species DETECTED (A) NOT DETECTED   Staphylococcus aureus NOT DETECTED NOT DETECTED   Methicillin resistance DETECTED (A) NOT DETECTED   Streptococcus species NOT DETECTED NOT DETECTED   Streptococcus agalactiae NOT DETECTED NOT DETECTED   Streptococcus pneumoniae NOT DETECTED NOT DETECTED   Streptococcus pyogenes NOT DETECTED NOT DETECTED   Acinetobacter baumannii NOT DETECTED NOT DETECTED   Enterobacteriaceae species NOT DETECTED NOT DETECTED   Enterobacter cloacae complex NOT DETECTED NOT DETECTED   Escherichia coli NOT DETECTED NOT DETECTED   Klebsiella oxytoca NOT DETECTED NOT DETECTED   Klebsiella pneumoniae NOT DETECTED NOT DETECTED   Proteus species NOT DETECTED NOT DETECTED   Serratia marcescens NOT DETECTED NOT DETECTED   Haemophilus influenzae NOT DETECTED NOT DETECTED   Neisseria meningitidis NOT DETECTED NOT DETECTED   Pseudomonas aeruginosa NOT DETECTED NOT DETECTED   Candida albicans NOT DETECTED NOT DETECTED   Candida glabrata NOT DETECTED NOT DETECTED   Candida krusei NOT DETECTED NOT DETECTED   Candida parapsilosis NOT DETECTED NOT DETECTED   Candida tropicalis NOT DETECTED NOT DETECTED    Name of physician (or Provider) Contacted: Marda Stalker  Changes to prescribed antibiotics required: yes, will restart vancomycin at previous dose.   Kia Varnadore D 12/12/2016  7:28 PM

## 2016-12-12 NOTE — Progress Notes (Signed)
[name] at [facility]                                                                                                                                                                                  Patient Demographics   Tiffany Mclean, is a 66 y.o. female, DOB - 1951/05/29, PPJ:[id number]  Admit date - 12/11/2016   Admitting Physician Harvie Bridge, DO  Outpatient Primary MD for the patient is Fisher, Kirstie Peri, MD   LOS - 0  Subjective: Patient admitted with acute respiratory failure husband at bedside currently remains on the vent  CT scan of the chest without pulmonary embolism  Review of Systems:   CONSTITUTIONAL: Intubated  Vitals:   Vitals:   12/12/16 1100 12/12/16 1115 12/12/16 1130 12/12/16 1145  BP: (!) 94/59  (!) 90/55   Pulse: 84 84 84 83  Resp: 15 16 15 15   Temp: 99.7 F (37.6 C) 100 F (37.8 C) 100 F (37.8 C) 100 F (37.8 C)  TempSrc:      SpO2: 100% 100% 100% 100%  Weight:      Height:        Wt Readings from Last 3 Encounters:  12/12/16 132 lb 0.9 oz (59.9 kg)  05/30/16 132 lb (59.9 kg)  04/21/16 136 lb (61.7 kg)     Intake/Output Summary (Last 24 hours) at 12/12/16 1245 Last data filed at 12/12/16 1200  Gross per 24 hour  Intake          5474.23 ml  Output              425 ml  Net          5049.23 ml    Physical Exam:   GENERAL: Intubated critically ill HEAD, EYES, EARS, NOSE AND THROAT: Atraumatic, normocephalic. Extraocular muscles are intact. Pupils equal and reactive to light. Sclerae anicteric. No conjunctival injection. No oro-pharyngeal erythema.  NECK: Supple. There is no jugular venous distention. No bruits, no lymphadenopathy, no thyromegaly.  HEART: Regular rate and rhythm,. No murmurs, no rubs, no clicks.  LUNGS: Decreased breath sounds bilaterally on the ventilator ABDOMEN: Soft, flat, nontender, nondistended. Has good bowel sounds. No hepatosplenomegaly appreciated.  EXTREMITIES: No  evidence of any cyanosis, clubbing, or peripheral edema.  +2 pedal and radial pulses bilaterally.  NEUROLOGIC: The patient is alert, awake, and oriented x3 with no focal motor or sensory deficits appreciated bilaterally.  SKIN: Moist and warm with no rashes appreciated.  Psych: Not anxious, depressed LN: No inguinal LN enlargement    Antibiotics   Anti-infectives    Start     Dose/Rate Route Frequency Ordered Stop   12/12/16 1000  vancomycin (VANCOCIN) IVPB 1000 mg/200 mL premix  Status:  Discontinued     1,000 mg 200 mL/hr over 60 Minutes Intravenous Every 12 hours 12/12/16 0330 12/12/16 1219   12/12/16 0115  vancomycin (VANCOCIN) IVPB 1000 mg/200 mL premix     1,000 mg 200 mL/hr over 60 Minutes Intravenous  Once 12/12/16 0110 12/12/16 0336   12/11/16 2338  meropenem (MERREM) 500 MG/50ML IVPB SOLR    Comments:  David, Raquel: cabinet override      12/11/16 2338 12/12/16 1144   12/11/16 2330  meropenem (MERREM) 1 g in sodium chloride 0.9 % 100 mL IVPB     1 g 200 mL/hr over 30 Minutes Intravenous Every 8 hours 12/11/16 2316        Medications   Scheduled Meds: . budesonide (PULMICORT) nebulizer solution  0.25 mg Nebulization Q6H  . chlorhexidine gluconate (MEDLINE KIT)  15 mL Mouth Rinse BID  . enoxaparin (LOVENOX) injection  40 mg Subcutaneous Q24H  . free water  200 mL Per Tube Q8H  . insulin aspart  0-15 Units Subcutaneous Q4H  . ipratropium-albuterol  3 mL Nebulization Q6H  . mouth rinse  15 mL Mouth Rinse QID  . methylPREDNISolone (SOLU-MEDROL) injection  40 mg Intravenous Q12H  . pantoprazole (PROTONIX) IV  40 mg Intravenous Q24H   Continuous Infusions: . feeding supplement (VITAL AF 1.2 CAL)    . fentaNYL infusion INTRAVENOUS 250 mcg/hr (12/12/16 1200)  . meropenem (MERREM) IV Stopped (12/12/16 1610)  . propofol (DIPRIVAN) infusion 30.05 mcg/kg/min (12/12/16 1200)   PRN Meds:.acetaminophen **OR** acetaminophen, albuterol, bisacodyl, bisacodyl, fentaNYL, magnesium  citrate, midazolam, midazolam, [DISCONTINUED] ondansetron **OR** ondansetron (ZOFRAN) IV, senna-docusate, sennosides   Data Review:   Micro Results Recent Results (from the past 240 hour(s))  Blood Culture (routine x 2)     Status: None (Preliminary result)   Collection Time: 12/11/16 10:52 PM  Result Value Ref Range Status   Specimen Description BLOOD RT HAND  Final   Special Requests   Final    BOTTLES DRAWN AEROBIC AND ANAEROBIC Blood Culture adequate volume   Culture NO GROWTH < 12 HOURS  Final   Report Status PENDING  Incomplete  Blood Culture (routine x 2)     Status: None (Preliminary result)   Collection Time: 12/11/16 10:52 PM  Result Value Ref Range Status   Specimen Description BLOOD L FA  Final   Special Requests BOTTLES DRAWN AEROBIC AND ANAEROBIC BCHV  Final   Culture NO GROWTH < 12 HOURS  Final   Report Status PENDING  Incomplete  MRSA PCR Screening     Status: None   Collection Time: 12/12/16  2:17 AM  Result Value Ref Range Status   MRSA by PCR NEGATIVE NEGATIVE Final    Comment:        The GeneXpert MRSA Assay (FDA approved for NASAL specimens only), is one component of a comprehensive MRSA colonization surveillance program. It is not intended to diagnose MRSA infection nor to guide or monitor treatment for MRSA infections.     Radiology Reports Dg Abd 1 View  Result Date: 12/12/2016 CLINICAL DATA:  NG tube placement EXAM: ABDOMEN - 1 VIEW COMPARISON:  None. FINDINGS: Enteric tube tip is in the left upper quadrant consistent with location in the upper stomach. Emphysematous changes suggested in the lung bases. IMPRESSION: Enteric tube tip is in the left upper quadrant consistent with location in the upper stomach. Electronically Signed   By: Lucienne Capers M.D.   On: 12/12/2016  03:57   Ct Angio Chest Pe W Or Wo Contrast  Result Date: 12/12/2016 CLINICAL DATA:  Acute onset altered mental status. Confusion. Hypoxia. Initial encounter. EXAM: CT  ANGIOGRAPHY CHEST WITH CONTRAST TECHNIQUE: Multidetector CT imaging of the chest was performed using the standard protocol during bolus administration of intravenous contrast. Multiplanar CT image reconstructions and MIPs were obtained to evaluate the vascular anatomy. CONTRAST:  75 mL of Isovue 370 IV contrast COMPARISON:  Chest radiograph performed earlier today at 12:11 a.m., and CTA of the chest performed 08/25/2015 FINDINGS: Cardiovascular:  There is no evidence of pulmonary embolus. The heart is normal in size. The thoracic aorta is grossly unremarkable. The great vessels are within normal limits. Mediastinum/Nodes: The mediastinum is unremarkable in appearance. No mediastinal lymphadenopathy is seen. No pericardial effusion is identified. The patient's endotracheal tube is seen ending 3 cm above the carina. The thyroid gland is unremarkable. No axillary lymphadenopathy is appreciated. Lungs/Pleura: Bilateral emphysema is noted. Minimal left basilar atelectasis or scarring is noted. No pleural effusion or pneumothorax is seen. No masses are identified. Upper Abdomen: The visualized portions of the liver and spleen are grossly unremarkable. Musculoskeletal: No acute osseous abnormalities are identified. The visualized musculature is unremarkable in appearance. There is suggestion of chronic disruption of the patient left breast implant. Review of the MIP images confirms the above findings. IMPRESSION: 1. No evidence of pulmonary embolus. 2. Bilateral emphysema noted. Minimal left basilar atelectasis or scarring noted. Electronically Signed   By: Garald Balding M.D.   On: 12/12/2016 02:20   Dg Chest Portable 1 View  Result Date: 12/12/2016 CLINICAL DATA:  Post intubation EXAM: PORTABLE CHEST 1 VIEW COMPARISON:  12/11/2016 FINDINGS: Interval placement of an endotracheal tube with tip measuring 5.3 cm above the carina. Heart size and pulmonary vascularity are normal. Emphysematous changes in the lungs with  scattered fibrosis. No focal consolidation or airspace disease. No blunting of costophrenic angles. No pneumothorax. Postoperative changes in the cervical spine. IMPRESSION: Endotracheal tube tip measures 5.3 cm above the carina, in good apparent location. Electronically Signed   By: Lucienne Capers M.D.   On: 12/12/2016 00:39   Dg Chest Port 1 View  Result Date: 12/12/2016 CLINICAL DATA:  Altered mental status EXAM: PORTABLE CHEST 1 VIEW COMPARISON:  04/17/2016 FINDINGS: Cervical spine hardware. Hyperinflation with emphysematous disease and bullous change in the right greater than left upper lobes. No acute consolidation or effusion. Stable cardiomediastinal silhouette. No pneumothorax. IMPRESSION: Hyperinflation with emphysematous disease. No acute infiltrate or edema Electronically Signed   By: Donavan Foil M.D.   On: 12/12/2016 00:11     CBC  Recent Labs Lab 12/11/16 2252 12/12/16 0303  WBC 11.7* 10.2  HGB 11.2* 10.2*  HCT 35.5 32.8*  PLT 215 177  MCV 89.0 90.7  MCH 28.1 28.3  MCHC 31.5* 31.2*  RDW 13.6 13.5  LYMPHSABS 1.3  --   MONOABS 0.7  --   EOSABS 0.0  --   BASOSABS 0.0  --     Chemistries   Recent Labs Lab 12/11/16 2251 12/12/16 0303  NA 144 142  K 4.6 4.1  CL 92* 97*  CO2 43* 35*  GLUCOSE 178* 250*  BUN 33* 27*  CREATININE 0.68 0.68  CALCIUM 9.8 8.6*  MG 1.8 2.2  AST 58* 64*  ALT 31 32  ALKPHOS 100 86  BILITOT 0.9 0.8   ------------------------------------------------------------------------------------------------------------------ estimated creatinine clearance is 63.9 mL/min (by C-G formula based on SCr of 0.68 mg/dL). ------------------------------------------------------------------------------------------------------------------ No results for  input(s): HGBA1C in the last 72 hours. ------------------------------------------------------------------------------------------------------------------ No results for input(s): CHOL, HDL, LDLCALC, TRIG,  CHOLHDL, LDLDIRECT in the last 72 hours. ------------------------------------------------------------------------------------------------------------------ No results for input(s): TSH, T4TOTAL, T3FREE, THYROIDAB in the last 72 hours.  Invalid input(s): FREET3 ------------------------------------------------------------------------------------------------------------------ No results for input(s): VITAMINB12, FOLATE, FERRITIN, TIBC, IRON, RETICCTPCT in the last 72 hours.  Coagulation profile  Recent Labs Lab 12/12/16 0303  INR 0.98    No results for input(s): DDIMER in the last 72 hours.  Cardiac Enzymes No results for input(s): CKMB, TROPONINI, MYOGLOBIN in the last 168 hours.  Invalid input(s): CK ------------------------------------------------------------------------------------------------------------------ Invalid input(s): [place]   This is a 66 y.o. 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 Acute respiratory failure with hypoxia and hypercapnia, in a patient with home-O2 dependent COPD.  DDX: CAP,  COPD exac, PE CT scan of the chest shows no pulmonary embolism Continue mechanical ventilation patient will be seen by pulmonary further management per them   #. Sepsis, unclear source - Continue IV antibiotics: Merrem, Vanco -Cultures negative  #. History of fibromyalgia/depression -Hold Cymbalta - Chronic opiate use  #. History of hyperlipidemia - hold Lipitor  #. History of GERD - hold AcipHex     Code Status Orders        Start     Ordered   12/12/16 1202  Do not attempt resuscitation (DNR)  Continuous    Question Answer Comment  In the event of cardiac or respiratory ARREST Do not call a "code blue"   In the event of cardiac or respiratory ARREST Do not perform Intubation, CPR,  defibrillation or ACLS   In the event of cardiac or respiratory ARREST Use medication by any route, position, wound care, and other measures to relive pain and suffering. May use oxygen, suction and manual treatment of airway obstruction as needed for comfort.      12/12/16 1219    Code Status History    Date Active Date Inactive Code Status Order ID Comments User Context   12/12/2016  2:18 AM 12/12/2016 12:19 PM Full Code [id number]  HugelmeyerUbaldo Glassing, DO Inpatient   04/17/2016 11:13 PM 04/21/2016  6:10 PM Full Code [id number]  Holley Raring, NP ED    Advance Directive Documentation     Most Recent Value  Type of Advance Directive  Living will  Pre-existing out of facility DNR order (yellow form or pink MOST form)  -  "MOST" Form in Place?  -           Consults  Intensivists DVT Prophylaxis  Lovenox   Lab Results  Component Value Date   PLT 177 12/12/2016     Time Spent in minutes   26mn spent  Greater than 50% of time spent in care coordination and counseling patient regarding the condition and plan of care.   PDustin FlockM.D on 12/12/2016 at 12:45 PM  Between 7am to 6pm - Pager - [phone]  After 6pm go to [email or web address] - password [email or web address]   Office  [phone]

## 2016-12-12 NOTE — Progress Notes (Signed)
Patient has only had 210 mL of UOP today. Hinton Dyer, NP notified and placed order for IVF. [name]

## 2016-12-12 NOTE — ED Notes (Signed)
RT made aware of pt's O2 sats being 89%; RT will increase O2% when she returns from transporting a pt to the floor.

## 2016-12-12 NOTE — Progress Notes (Signed)
Per Dr. Alva Garnet d/c order for PICC line. [name]

## 2016-12-12 NOTE — Progress Notes (Signed)
PT Cancellation Note  Patient Details Name: Tiffany Mclean MRN: [id number] DOB: May 16, 1951   Cancelled Treatment:    Reason Eval/Treat Not Completed: Medical issues which prohibited therapy Spoke with nursing who reports pt is on vent and not appropriate for PT today.  Will continue to check in and see pt when she is appropriate. Apparently pt has been bed bound 2-3 years, unsure what pt/family expectations are for after hospital stay.  Kreg Shropshire, DPT 12/12/2016, 10:23 AM

## 2016-12-13 ENCOUNTER — Encounter: Payer: Self-pay | Admitting: Physician Assistant

## 2016-12-13 ENCOUNTER — Inpatient Hospital Stay: Payer: BLUE CROSS/BLUE SHIELD

## 2016-12-13 DIAGNOSIS — A419 Sepsis, unspecified organism: Secondary | ICD-10-CM | POA: Diagnosis not present

## 2016-12-13 DIAGNOSIS — E119 Type 2 diabetes mellitus without complications: Secondary | ICD-10-CM

## 2016-12-13 DIAGNOSIS — J9602 Acute respiratory failure with hypercapnia: Secondary | ICD-10-CM | POA: Diagnosis not present

## 2016-12-13 DIAGNOSIS — R6520 Severe sepsis without septic shock: Secondary | ICD-10-CM | POA: Diagnosis not present

## 2016-12-13 DIAGNOSIS — Z7189 Other specified counseling: Secondary | ICD-10-CM | POA: Diagnosis not present

## 2016-12-13 DIAGNOSIS — G9341 Metabolic encephalopathy: Secondary | ICD-10-CM | POA: Diagnosis not present

## 2016-12-13 DIAGNOSIS — C801 Malignant (primary) neoplasm, unspecified: Secondary | ICD-10-CM | POA: Diagnosis present

## 2016-12-13 DIAGNOSIS — J449 Chronic obstructive pulmonary disease, unspecified: Secondary | ICD-10-CM | POA: Diagnosis present

## 2016-12-13 DIAGNOSIS — R7881 Bacteremia: Secondary | ICD-10-CM | POA: Diagnosis not present

## 2016-12-13 DIAGNOSIS — Z515 Encounter for palliative care: Secondary | ICD-10-CM | POA: Diagnosis not present

## 2016-12-13 DIAGNOSIS — J9600 Acute respiratory failure, unspecified whether with hypoxia or hypercapnia: Secondary | ICD-10-CM | POA: Diagnosis not present

## 2016-12-13 DIAGNOSIS — J9601 Acute respiratory failure with hypoxia: Secondary | ICD-10-CM | POA: Diagnosis not present

## 2016-12-13 LAB — CBC
HEMATOCRIT: 26.7 % — AB (ref 35.0–47.0)
HEMOGLOBIN: 8.3 g/dL — AB (ref 12.0–16.0)
MCH: 27.2 pg (ref 26.0–34.0)
MCHC: 30.9 g/dL — ABNORMAL LOW (ref 32.0–36.0)
MCV: 88 fL (ref 80.0–100.0)
Platelets: 172 10*3/uL (ref 150–440)
RBC: 3.04 MIL/uL — AB (ref 3.80–5.20)
RDW: 13.9 % (ref 11.5–14.5)
WBC: 18.2 10*3/uL — ABNORMAL HIGH (ref 3.6–11.0)

## 2016-12-13 LAB — GLUCOSE, CAPILLARY
GLUCOSE-CAPILLARY: 131 mg/dL — AB (ref 65–99)
GLUCOSE-CAPILLARY: 144 mg/dL — AB (ref 65–99)
GLUCOSE-CAPILLARY: 142 mg/dL — AB (ref 65–99)
Glucose-Capillary: 110 mg/dL — ABNORMAL HIGH (ref 65–99)
Glucose-Capillary: 116 mg/dL — ABNORMAL HIGH (ref 65–99)
Glucose-Capillary: 150 mg/dL — ABNORMAL HIGH (ref 65–99)
Glucose-Capillary: 183 mg/dL — ABNORMAL HIGH (ref 65–99)

## 2016-12-13 LAB — PROCALCITONIN: Procalcitonin: <0.1 ng/mL

## 2016-12-13 LAB — URINE CULTURE: Culture: NO GROWTH

## 2016-12-13 LAB — BASIC METABOLIC PANEL
Anion gap: 3 — ABNORMAL LOW (ref 5–15)
BUN: 36 mg/dL — ABNORMAL HIGH (ref 6–20)
CALCIUM: 8.6 mg/dL — AB (ref 8.9–10.3)
CO2: 34 mmol/L — AB (ref 22–32)
Chloride: 104 mmol/L (ref 101–111)
Creatinine, Ser: 0.7 mg/dL (ref 0.44–1.00)
GFR calc non Af Amer: >60 mL/min (ref >60)
Glucose, Bld: 164 mg/dL — ABNORMAL HIGH (ref 65–99)
POTASSIUM: 4.1 mmol/L (ref 3.5–5.1)
Sodium: 141 mmol/L (ref 135–145)

## 2016-12-13 LAB — LACTIC ACID, PLASMA: LACTIC ACID, VENOUS: 1.1 mmol/L (ref 0.5–1.9)

## 2016-12-13 LAB — MAGNESIUM: Magnesium: 2 mg/dL (ref 1.7–2.4)

## 2016-12-13 LAB — PHOSPHORUS: Phosphorus: 1.1 mg/dL — ABNORMAL LOW (ref 2.5–4.6)

## 2016-12-13 MED ORDER — DEXMEDETOMIDINE HCL 200 MCG/2ML IV SOLN
0.4000 ug/kg/h | INTRAVENOUS | Status: DC
  Administered 2016-12-13: 0.4 ug/kg/h via INTRAVENOUS
  Administered 2016-12-13: 0.5 ug/kg/h via INTRAVENOUS
  Administered 2016-12-14: 0.6 ug/kg/h via INTRAVENOUS
  Filled 2016-12-13 (×4): qty 2

## 2016-12-13 MED ORDER — POTASSIUM PHOSPHATES 15 MMOLE/5ML IV SOLN
20.0000 mmol | Freq: Once | INTRAVENOUS | Status: AC
  Administered 2016-12-13: 20 mmol via INTRAVENOUS
  Filled 2016-12-13: qty 6.67

## 2016-12-13 MED ORDER — FENTANYL CITRATE (PF) 100 MCG/2ML IJ SOLN
50.0000 ug | INTRAMUSCULAR | Status: DC | PRN
  Administered 2016-12-13 – 2016-12-17 (×12): 50 ug via INTRAVENOUS
  Filled 2016-12-13 (×8): qty 2

## 2016-12-13 NOTE — Plan of Care (Signed)
Problem: Safety: Goal: Ability to remain free from injury will improve Outcome: Progressing Pt has remained free from injury on my shift.   Problem: Pain Managment: Goal: General experience of comfort will improve Outcome: Progressing Pt pain controlled with fentanyl pushes on my shifts.

## 2016-12-13 NOTE — Progress Notes (Signed)
PULMONARY / CRITICAL CARE MEDICINE   Name: Tiffany Mclean MRN: [id number] DOB: November 12, 1950    ADMISSION DATE:  12/11/2016   CONSULTATION DATE:  12/11/2016  REFERRING MD:  Hugelmelyer  Reason: Acute hypercarbic respiratory failure  CHIEF COMPLAINT:  Unresponsive  HISTORY OF PRESENT ILLNESS:   66 yo female admitted 05/23 with acute on chronic hypercapnic hypoxic respiratory failure secondary to AECOPD, acute encephalopathy, lactic acidosis, and ?sepsis requiring mechanical intubation  REVIEW OF SYSTEMS:   Unable to obtain as patient is currently intubated  SUBJECTIVE:  Pt remain intubated attempted spontaneous breathing trial this am pt did not tolerate extremely agitated, tachycardic, and hypoxic   VITAL SIGNS: BP 112/62   Pulse (!) 105   Temp 98.6 F (37 C)   Resp 15   Ht 5\' 6"  (1.676 m)   Wt 59.9 kg (132 lb 0.9 oz)   SpO2 92%   BMI 21.31 kg/m   HEMODYNAMICS:    VENTILATOR SETTINGS: Vent Mode: PRVC FiO2 (%):  [35 %] 35 % Set Rate:  [15 bmp] 15 bmp Vt Set:  [500 mL] 500 mL PEEP:  [5 cmH20] 5 cmH20 Pressure Support:  [8 cmH20] 8 cmH20  INTAKE / OUTPUT: I/O last 3 completed shifts: In: 7726.1 [I.V.:1863.6; NG/GT:962.5; IV Piggyback:4900] Out: 1045 [Urine:1045]  PHYSICAL EXAMINATION: General: acutely ill appearing Caucasian female, NAD mechanically intubated  Neuro: sedated, moves upper and lower extremities, withdraws from painful stimulation, PERRL HEENT: trachea midline, no JVD Cardiovascular: nsr, s1s2, no M/R/G Lungs: Diminished breath sounds throughout, no wheezes or rhonchi, even non labored  Abdomen: Soft, nontender, nondistended, +BS x4 Musculoskeletal:  No joint deformities, no edema Skin: Warm and dry  LABS:  BMET  Recent Labs Lab 12/11/16 2251 12/12/16 0303 12/13/16 0545  NA 144 142 141  K 4.6 4.1 4.1  CL 92* 97* 104  CO2 43* 35* 34*  BUN 33* 27* 36*  CREATININE 0.68 0.68 0.70  GLUCOSE 178* 250* 164*    Electrolytes  Recent  Labs Lab 12/11/16 2251 12/12/16 0303 12/13/16 0545  CALCIUM 9.8 8.6* 8.6*  MG 1.8 2.2 2.0  PHOS  --  2.1* 1.1*    CBC  Recent Labs Lab 12/11/16 2252 12/12/16 0303 12/13/16 0545  WBC 11.7* 10.2 18.2*  HGB 11.2* 10.2* 8.3*  HCT 35.5 32.8* 26.7*  PLT 215 177 172    Coag's  Recent Labs Lab 12/12/16 0303  APTT <24*  INR 0.98    Sepsis Markers  Recent Labs Lab 12/12/16 0303 12/12/16 0705 12/12/16 2126 12/13/16 0545  LATICACIDVEN 5.1* 6.2* 1.1 1.1  PROCALCITON <0.10  --   --  <0.10    ABG  Recent Labs Lab 12/12/16 0105 12/12/16 0458  PHART 7.00* 7.40  PCO2ART 120* 55*  PO2ART 156* 263*    Liver Enzymes  Recent Labs Lab 12/11/16 2251 12/12/16 0303  AST 58* 64*  ALT 31 32  ALKPHOS 100 86  BILITOT 0.9 0.8  ALBUMIN 3.9 3.2*    Cardiac Enzymes No results for input(s): TROPONINI, PROBNP in the last 168 hours.  Glucose  Recent Labs Lab 12/12/16 2005 12/12/16 2342 12/13/16 0359 12/13/16 0712 12/13/16 0831 12/13/16 1152  GLUCAP 154* 153* 131* 144* 142* 110*    Imaging Dg Chest Port 1 View  Result Date: 12/13/2016 CLINICAL DATA:  Respiratory failure. EXAM: PORTABLE CHEST 1 VIEW COMPARISON:  CT 12/12/2016. Chest x-ray 12/12/2016, 12/11/2016, 04/17/2016 FINDINGS: Endotracheal tube, NG tube in stable position. Heart size normal. Increased density noted over the lungs most likely  related to overlying breast implants. Mild bilateral pleural-parenchymal thickening noted most likely related scarring. COPD . No pleural effusion or pneumothorax. Prior cervical spine fusion . IMPRESSION: 1.  Lines and tubes in stable position. 2. Pleuroparenchymal thickening consistent scarring. COPD. Similar findings noted on prior exam . No acute abnormality. Electronically Signed   By: [name]  Register   On: 12/13/2016 07:06    STUDIES:  CT Angio Chest 05/24>>No evidence of pulmonary embolus. Bilateral emphysema noted. Minimal left basilar atelectasis or scarring  noted.  CULTURES: Blood cultures 2 05/23>>negative x1 bottle, staph species x1 bottle likely contaminate  Urine culture 05/23>>negative  Sputum>>  ANTIBIOTICS: Vancomycin 05/241 Meropenem 05/24 >>   SIGNIFICANT EVENTS: 12/12/16: Admitted with acute hypercarbic respiratory failure mechanically intubated   LINES/TUBES: Peripheral IVs  DISCUSSION: This is a 66 year old Caucasian female presenting with acute hypercarbic/hypoxic respiratory failure, sepsis, septic shock, acute encephalopathy and lactic acidosis  ASSESSMENT / PLAN:  PULMONARY A: Acute hypercarbic/hypoxic respiratory failure Acute on chronic COPD exacerbation Pulmonary embolism ruled out P:   Full vent support with current settings Maintain O2 sats 88% to 92% Chest x-ray and ABG when necessary Nebulized bronchodilator and steroids VAP Protocol Attempt SBT again in am 05/26  CARDIOVASCULAR A:  Hypotension-resolved with fluid resuscitation Elevated lactate-resolved  P:  NS Hemodynamic monitoring per ICU We'll hold off on central venous catheter since patient's blood pressure stable  GASTROINTESTINAL A:   No acute issues P:   TF protocol  PPI for GI prophylaxis  HEMATOLOGIC A:   Mild anemia P:  Trend CBC and transfuse for hemoglobin less than 7 Lovenox for VTE prophylaxis  INFECTIOUS A:   Possible sepsis of unknown etiology although unlikely  P:   Broad-spectrum antibiotics. Follow-up blood and urine cultures Trend pro-calcitonin Trend WBC and monitor fever curve  ENDOCRINE A:   History of type 2 diabetes P:   Blood glucose monitoring with sliding scale insulin coverage  NEUROLOGIC A:   Acute hypercarbic and metabolic encephalopathy P:   RASS goal: -1 to -2 Will start Precedex gtt to maintain RASS goal, prn fentanyl for pain management  WUA daily  FAMILY  No family at bedside to update 05/25.  Per Palliative Care consult plans to meet with pts husband again on Monday with the  extended family to readdress goals of care.   Marda Stalker, [place] Pager [phone] (please enter 7 digits) PCCM Consult Pager [phone] (please enter 7 digits)

## 2016-12-13 NOTE — Progress Notes (Signed)
Pharmacy Antibiotic Note/Electrolyte Monitoring  Tiffany Mclean is a 66 y.o. female with a h/o oxygen-dependient COPD admitted on 12/11/2016 with acute respiratory failure.  Pharmacy has been consulted for meropenem dosing as well as electrolyte monitoring and replacement.   Plan: 1. After discussion with Dr. Jefferson Fuel, will d/c vancomycin with MRSA PCR negative and 1/2 blood cultures growing CNS.   Continue meropenem 1 g iv q 8 hours.   2. Potassium phosphate 20 mmol iv once and will f/u AM labs.   Height: 5\' 6"  (167.6 cm) Weight: 132 lb 0.9 oz (59.9 kg) IBW/kg (Calculated) : 59.3  Temp (24hrs), Avg:99.4 F (37.4 C), Min:97.7 F (36.5 C), Max:100.2 F (37.9 C)   Recent Labs Lab 12/11/16 2251 12/11/16 2252 12/12/16 0303 12/12/16 0705 12/12/16 2126 12/13/16 0545  WBC  --  11.7* 10.2  --   --  18.2*  CREATININE 0.68  --  0.68  --   --  0.70  LATICACIDVEN  --  3.5* 5.1* 6.2* 1.1 1.1    Estimated Creatinine Clearance: 63.9 mL/min (by C-G formula based on SCr of 0.7 mg/dL).    BMP Latest Ref Rng & Units 12/13/2016 12/12/2016 12/11/2016  Glucose 65 - 99 mg/dL 164(H) 250(H) 178(H)  BUN 6 - 20 mg/dL 36(H) 27(H) 33(H)  Creatinine 0.44 - 1.00 mg/dL 0.70 0.68 0.68  BUN/Creat Ratio 12 - 28 - - -  Sodium 135 - 145 mmol/L 141 142 144  Potassium 3.5 - 5.1 mmol/L 4.1 4.1 4.6  Chloride 101 - 111 mmol/L 104 97(L) 92(L)  CO2 22 - 32 mmol/L 34(H) 35(H) 43(H)  Calcium 8.9 - 10.3 mg/dL 8.6(L) 8.6(L) 9.8   Magnesium (mg/dL)  Date Value  12/13/2016 2.0  11/18/2014 1.6 (L)   Phosphorus (mg/dL)  Date Value  12/13/2016 1.1 (L)  11/18/2014 2.5   Albumin (g/dL)  Date Value  12/12/2016 3.2 (L)  06/21/2016 3.8  11/10/2014 2.9 (L)     Allergies  Allergen Reactions  . Asa [Aspirin]   . Avelox [Moxifloxacin]   . Cephalexin   . Codeine   . Erythromycin Ethylsuccinate   . Flagyl [Metronidazole]   . Latex   . Levaquin [Levofloxacin] Hives  . Meloxicam   . Shellfish Allergy  Nausea And Vomiting and Other (See Comments)    Dizzy, fainting  . Sulfa Antibiotics   . Topiramate   . Doxycycline Rash  . Tetracyclines & Related Rash    Antimicrobials this admission: meropenem 5/24 >>  vancomycin 5/24 >>   Dose adjustments this admission:   Microbiology results: 5/23 BCx: CNS 1/2 5/23 UCx: NG  Sputum: sent  5/24 MRSA PCR: negative  Thank you for allowing pharmacy to be a part of this patient's care.  Ulice Dash D 12/13/2016 3:02 PM

## 2016-12-13 NOTE — H&P (Signed)
Consultation Note Date: 12/13/2016   Patient Name: Tiffany Mclean  DOB: Dec 14, 1950  MRN: [id number]  Age / Sex: 66 y.o., female  PCP: Birdie Sons, MD Referring Physician: Dustin Flock, MD  Reason for Consultation: Establishing goals of care  HPI/Patient Profile: 66 y.o. 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 She was intubated and is being treated in the ICU.  Blood cultures are positive for MRSA bacteremia.  She failed the spontaneous breathing trial today due to agitation, tachycardia and hypoxemia.  Of note, [name], "Tiffany Mclean" has completed Advanced Directives 2x in the past 2 years.  She was adamant that she did not want to be intubated or have ACLS.  Her paperwork is in EPIC.  Clinical Assessment and Goals of Care:   I have reviewed medical records including EPIC notes, labs and imaging, received report from the care team including the bedside RN, assessed the patient and then met at the bedside along with Corky Mull (husband) to discuss diagnosis prognosis, GOC, EOL wishes, disposition and options.  Tiffany Mclean becomes very agitated during my brief visit and begins swinging her arms and fighting the vent.  Her pulse climbs to 140 and her sats drop to 83%.  I introduced Palliative Medicine as specialized medical care for people living with serious illness. It focuses on providing relief from the symptoms and stress of a serious illness.   We discussed a brief life review of the patient. 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 Her husband describes her as "earthy".   She likes country music and comedies on TV.  They have 3 children.  One daughter and grandson live with them and two other sons in [place].  She also has several  siblings in the area.  For the last couple of years Tiffany Mclean, has been bed bound.  She is unable to walk but also does not want to get out of bed - she is afraid to get out of her bed at home.  It is her nest.    EPIC notes written by Psychiatry from previous hospitalizations indicate panic attacks, delirium, paranoia.   Natural disease trajectory and expectations at EOL were discussed.  Tiffany Mclean is aware that his wife has MRSA bacteremia.  He asks how long MRSA can survive in the body.  She had a MRSA infection 1 or 2 years ago and "circled the drain" at that time per Tiffany Mclean.  Tiffany Mclean seems to feel guilty about his wife being in the hospital and intubated.  She clearly told him "you are not God" when discussing intubation in the past.  Tiffany Mclean states his wife is very tired and wants to go be with her mother.  Tiffany Mclean describes disagreement in the family. His daughter is very much in support of life prolonging measures for her mother.  We discussed hospice.  If she survives to discharge  Tiffany Mclean is agreeable to Tiffany Mclean going home with hospice services and not returning to the hospital.  He feels this is what she would want - to have a comfortable peaceful passing in her own bed at home.  I asked Tiffany Mclean to consider her comfort vs her respiratory status - she is accustomed to chronic opiates Kadian 60 mg ER and Xanax q6 hours at home.  He may have to choose at some point does he want her to be comfortable or maintain a safe respiratory status?  I advised Tiffany Mclean to gather the family.  Let them know there is a strong possibility she will not survive and allow them to visit if they want to see her.  We will meet again on Monday with the extended family.  Questions and concerns were addressed.  Hard Choices booklet left for review.    Primary Decision Maker:  HCPOA/husband Tiffany Mclean    SUMMARY OF RECOMMENDATIONS    Code Status/Advance Care Planning:  DNR    Symptom Management:   Patient is on chronic Kadian CR 60 mg daily and  chronic Xanax .25 mg q 6 hours - will defer to CCM until family opts for comfort measures only.  Additional Recommendations (Limitations, Scope, Preferences):  Full Scope Treatment   If patient survives the hospitalization he wants to take her home with hospice services.  If she is still on the Vent Monday, we will reassess and talk with the family about comfort measures.  Palliative Prophylaxis:   Aspiration, Delirium Protocol and Frequent Pain Assessment  Psycho-social/Spiritual:   Desire for further Chaplaincy support: yes.  Patient is christian - non denominational  Prognosis:   Unable to determine.  Very poor prognosis.  End stage COPD, bed bound for the past 2 years, MRSA bacteremia, and evidently significant psychiatric illness.  Discharge Planning: Anticipated Hospital Death vs home with hospice services.      Primary Diagnoses: Present on Admission: . Acute respiratory failure with hypoxia and hypercapnia (HCC)   I have reviewed the medical record, interviewed the patient and family, and examined the patient. The following aspects are pertinent.  Past Medical History:  Diagnosis Date  . Cancer (HCC)    squamous cell, vaginal  . COPD (chronic obstructive pulmonary disease) ([facility])   . Diabetes mellitus without complication ([facility])   . Pneumonia    Social History   Social History  . Marital status: Married    Spouse name: N/A  . Number of children: 3  . Years of education: N/A   Occupational History  . unemployed    Social History Main Topics  . Smoking status: Former Smoker    Packs/day: 1.50    Years: 30.00    Types: Cigarettes    Quit date: 11/21/2008  . Smokeless tobacco: Former Systems developer    Quit date: 07/22/2008  . Alcohol use No  . Drug use: No  . Sexual activity: Not Currently   Other Topics Concern  . None   Social History Narrative  . None   Family History  Problem Relation Age of Onset  . Gout Mother   . Hypertension Mother   . Arthritis  Mother   . Stroke Mother   . Kidney disease Mother   . CAD Mother   . Emphysema Father   . Gout Father   . Hypertension Father   . Kidney disease Father   . Arthritis Father   . Ulcers Father    Scheduled Meds: . budesonide (PULMICORT) nebulizer solution  0.25  mg Nebulization Q6H  . chlorhexidine gluconate (MEDLINE KIT)  15 mL Mouth Rinse BID  . enoxaparin (LOVENOX) injection  40 mg Subcutaneous Q24H  . free water  200 mL Per Tube Q8H  . insulin aspart  0-15 Units Subcutaneous Q4H  . ipratropium-albuterol  3 mL Nebulization Q6H  . mouth rinse  15 mL Mouth Rinse QID  . methylPREDNISolone (SOLU-MEDROL) injection  40 mg Intravenous Q12H  . pantoprazole (PROTONIX) IV  40 mg Intravenous Q24H   Continuous Infusions: . sodium chloride 50 mL/hr at 12/12/16 2000  . feeding supplement (VITAL AF 1.2 CAL) 1,000 mL (12/12/16 1900)  . fentaNYL infusion INTRAVENOUS 200 mcg/hr (12/13/16 0500)  . meropenem (MERREM) IV 1 g (12/13/16 0929)  . propofol (DIPRIVAN) infusion 25 mcg/kg/min (12/12/16 2226)  . vancomycin Stopped (12/13/16 0553)   PRN Meds:.acetaminophen **OR** acetaminophen, albuterol, bisacodyl, bisacodyl, fentaNYL, magnesium citrate, midazolam, [DISCONTINUED] ondansetron **OR** ondansetron (ZOFRAN) IV, senna-docusate, sennosides Allergies  Allergen Reactions  . Asa [Aspirin]   . Avelox [Moxifloxacin]   . Cephalexin   . Codeine   . Erythromycin Ethylsuccinate   . Flagyl [Metronidazole]   . Latex   . Levaquin [Levofloxacin] Hives  . Meloxicam   . Shellfish Allergy Nausea And Vomiting and Other (See Comments)    Dizzy, fainting  . Sulfa Antibiotics   . Topiramate   . Doxycycline Rash  . Tetracyclines & Related Rash   Review of Systems intubated  Physical Exam  Well developed intubated, awake, in mitt restraints, agitated CV tachycardic Resp intubated Looks to the left - seems to neglect the right  Husband at bedside.  Vital Signs: BP 99/64   Pulse 78   Temp 100 F  (37.8 C)   Resp 15   Ht 5' 6"  (1.676 m)   Wt 59.9 kg (132 lb 0.9 oz)   SpO2 97%   BMI 21.31 kg/m  Pain Assessment: CPOT       SpO2: SpO2: 97 % O2 Device:SpO2: 97 % O2 Flow Rate: .O2 Flow Rate (L/min): 5 L/min  IO: Intake/output summary:  Intake/Output Summary (Last 24 hours) at 12/13/16 0619 Last data filed at 12/13/16 0500  Gross per 24 hour  Intake          3563.42 ml  Output              595 ml  Net          2968.42 ml    LBM: Last BM Date: 12/11/16 Baseline Weight: Weight: 59.9 kg (132 lb) Most recent weight: Weight: 59.9 kg (132 lb 0.9 oz)     Palliative Assessment/Data:     Time In: 10:00 Time Out: 11:30 Time Total: 90 min. Greater than 50%  of this time was spent counseling and coordinating care related to the above assessment and plan.  Signed by: Imogene Burn, PA-C Palliative Medicine Pager: [phone]  Please contact Palliative Medicine Team phone at [phone] for questions and concerns.  For individual provider: See Shea Evans

## 2016-12-13 NOTE — Progress Notes (Signed)
[place] at [facility]                                                                                                                                                                                  Patient Demographics   Tiffany Mclean, is a 66 y.o. female, DOB - January 18, 1951, JXB:[id number]  Admit date - 12/11/2016   Admitting Physician Harvie Bridge, DO  Outpatient Primary MD for the patient is Birdie Sons, MD   LOS - 1  Subjective: Patient remains on the ventilator Blood pressure low  Review of Systems:   CONSTITUTIONAL: Intubated  Vitals:   Vitals:   12/13/16 0931 12/13/16 1100 12/13/16 1200 12/13/16 1300  BP: 108/68 (!) 95/54 (!) 92/53 112/62  Pulse: (!) 129 92 79 (!) 105  Resp: _0 Temp: 99 F (37.2 C) 97.9 F (36.6 C) 97.7 F (36.5 C) 98.6 F (37 C)  TempSrc:   Core (Comment)   SpO2: 97% 98% 99% 92%  Weight:      Height:        Wt Readings from Last 3 Encounters:  12/12/16 132 lb 0.9 oz (59.9 kg)  05/30/16 132 lb (59.9 kg)  04/21/16 136 lb (61.7 kg)     Intake/Output Summary (Last 24 hours) at 12/13/16 1358 Last data filed at 12/13/16 1300  Gross per 24 hour  Intake          4153.76 ml  Output              770 ml  Net          3383.76 ml    Physical Exam:   GENERAL: Intubated critically ill HEAD, EYES, EARS, NOSE AND THROAT: Atraumatic, normocephalic. Extraocular muscles are intact. Pupils equal and reactive to light. Sclerae anicteric. No conjunctival injection. No oro-pharyngeal erythema.  NECK: Supple. There is no jugular venous distention. No bruits, no lymphadenopathy, no thyromegaly.  HEART: Regular rate and rhythm,. No murmurs, no rubs, no clicks.  LUNGS: Decreased breath sounds bilaterally on the ventilator ABDOMEN: Soft, flat, nontender, nondistended. Has good bowel sounds. No hepatosplenomegaly appreciated.  EXTREMITIES: No evidence of any cyanosis, clubbing, or peripheral edema.  +2 pedal  and radial pulses bilaterally.  NEUROLOGIC: The patient is alert, awake, and oriented x3 with no focal motor or sensory deficits appreciated bilaterally.  SKIN: Moist and warm with no rashes appreciated.  Psych: Not anxious, depressed LN: No inguinal LN enlargement    Antibiotics   Anti-infectives    Start     Dose/Rate Route Frequency Ordered Stop   12/13/16 0400  vancomycin (VANCOCIN) IVPB 1000 mg/200 mL premix  Status:  Discontinued  1,000 mg 200 mL/hr over 60 Minutes Intravenous Every 12 hours 12/12/16 1922 12/13/16 1110   12/12/16 2000  vancomycin (VANCOCIN) IVPB 1000 mg/200 mL premix     1,000 mg 200 mL/hr over 60 Minutes Intravenous  Once 12/12/16 1921 12/12/16 2108   12/12/16 1000  vancomycin (VANCOCIN) IVPB 1000 mg/200 mL premix  Status:  Discontinued     1,000 mg 200 mL/hr over 60 Minutes Intravenous Every 12 hours 12/12/16 0330 12/12/16 1219   12/12/16 0115  vancomycin (VANCOCIN) IVPB 1000 mg/200 mL premix     1,000 mg 200 mL/hr over 60 Minutes Intravenous  Once 12/12/16 0110 12/12/16 0336   12/11/16 2338  meropenem (MERREM) 500 MG/50ML IVPB SOLR    Comments:  David, Raquel: cabinet override      12/11/16 2338 12/12/16 1144   12/11/16 2330  meropenem (MERREM) 1 g in sodium chloride 0.9 % 100 mL IVPB     1 g 200 mL/hr over 30 Minutes Intravenous Every 8 hours 12/11/16 2316        Medications   Scheduled Meds: . budesonide (PULMICORT) nebulizer solution  0.25 mg Nebulization Q6H  . chlorhexidine gluconate (MEDLINE KIT)  15 mL Mouth Rinse BID  . enoxaparin (LOVENOX) injection  40 mg Subcutaneous Q24H  . free water  200 mL Per Tube Q8H  . insulin aspart  0-15 Units Subcutaneous Q4H  . ipratropium-albuterol  3 mL Nebulization Q6H  . mouth rinse  15 mL Mouth Rinse QID  . methylPREDNISolone (SOLU-MEDROL) injection  40 mg Intravenous Q12H  . pantoprazole (PROTONIX) IV  40 mg Intravenous Q24H   Continuous Infusions: . sodium chloride 50 mL/hr at 12/13/16 1300  .  dexmedetomidine (PRECEDEX) IV infusion 0.6 mcg/kg/hr (12/13/16 1330)  . feeding supplement (VITAL AF 1.2 CAL) 1,000 mL (12/13/16 1300)  . meropenem (MERREM) IV Stopped (12/13/16 0762)  . potassium phosphate IVPB (mmol) 20 mmol (12/13/16 1207)  . propofol (DIPRIVAN) infusion 40 mcg/kg/min (12/13/16 1330)   PRN Meds:.acetaminophen **OR** acetaminophen, albuterol, bisacodyl, bisacodyl, fentaNYL (SUBLIMAZE) injection, magnesium citrate, midazolam, [DISCONTINUED] ondansetron **OR** ondansetron (ZOFRAN) IV, senna-docusate, sennosides   Data Review:   Micro Results Recent Results (from the past 240 hour(s))  Blood Culture (routine x 2)     Status: None (Preliminary result)   Collection Time: 12/11/16 10:52 PM  Result Value Ref Range Status   Specimen Description BLOOD RT HAND  Final   Special Requests   Final    BOTTLES DRAWN AEROBIC AND ANAEROBIC Blood Culture adequate volume   Culture  Setup Time   Final    GRAM POSITIVE COCCI GRAM POSITIVE RODS IN BOTH AEROBIC AND ANAEROBIC BOTTLES CRITICAL RESULT CALLED TO, READ BACK BY AND VERIFIED WITH: JASON ROBBINS AT 1808 12/12/2016 BY TFK. JASON ROBBINS AT 2120 ON 12/12/2016 JJB Performed at [facility], [place] [address] [place], [place] [address]    Culture GRAM POSITIVE COCCI GRAM POSITIVE RODS   Final   Report Status PENDING  Incomplete  Blood Culture (routine x 2)     Status: None (Preliminary result)   Collection Time: 12/11/16 10:52 PM  Result Value Ref Range Status   Specimen Description BLOOD L FA  Final   Special Requests BOTTLES DRAWN AEROBIC AND ANAEROBIC [place]  Final   Culture NO GROWTH 2 DAYS  Final   Report Status PENDING  Incomplete  Urine culture     Status: None   Collection Time: 12/11/16 10:52 PM  Result Value Ref Range Status   Specimen Description  URINE, RANDOM  Final   Special Requests NONE  Final   Culture   Final    NO GROWTH Performed at [facility], [place] [address] [place], [place] [address]     Report Status 12/13/2016 FINAL  Final  Blood Culture ID Panel (Reflexed)     Status: Abnormal   Collection Time: 12/11/16 10:52 PM  Result Value Ref Range Status   Enterococcus species NOT DETECTED NOT DETECTED Final   Listeria monocytogenes NOT DETECTED NOT DETECTED Final   Staphylococcus species DETECTED (A) NOT DETECTED Final    Comment: Methicillin (oxacillin) resistant coagulase negative staphylococcus. Possible blood culture contaminant (unless isolated from more than one blood culture draw or clinical case suggests pathogenicity). No antibiotic treatment is indicated for blood  culture contaminants. CRITICAL RESULT CALLED TO, READ BACK BY AND VERIFIED WITH: JASON ROBBINS AT 1808 12/12/2016 BY TFK.    Staphylococcus aureus NOT DETECTED NOT DETECTED Final   Methicillin resistance DETECTED (A) NOT DETECTED Final    Comment: CRITICAL RESULT CALLED TO, READ BACK BY AND VERIFIED WITH: JASON ROBBINS AT 5621 12/12/2016 BY TFK.    Streptococcus species NOT DETECTED NOT DETECTED Final   Streptococcus agalactiae NOT DETECTED NOT DETECTED Final   Streptococcus pneumoniae NOT DETECTED NOT DETECTED Final   Streptococcus pyogenes NOT DETECTED NOT DETECTED Final   Acinetobacter baumannii NOT DETECTED NOT DETECTED Final   Enterobacteriaceae species NOT DETECTED NOT DETECTED Final   Enterobacter cloacae complex NOT DETECTED NOT DETECTED Final   Escherichia coli NOT DETECTED NOT DETECTED Final   Klebsiella oxytoca NOT DETECTED NOT DETECTED Final   Klebsiella pneumoniae NOT DETECTED NOT DETECTED Final   Proteus species NOT DETECTED NOT DETECTED Final   Serratia marcescens NOT DETECTED NOT DETECTED Final   Haemophilus influenzae NOT DETECTED NOT DETECTED Final   Neisseria meningitidis NOT DETECTED NOT DETECTED Final   Pseudomonas aeruginosa NOT DETECTED NOT DETECTED Final   Candida albicans NOT DETECTED NOT DETECTED Final   Candida glabrata NOT DETECTED NOT DETECTED Final   Candida krusei NOT  DETECTED NOT DETECTED Final   Candida parapsilosis NOT DETECTED NOT DETECTED Final   Candida tropicalis NOT DETECTED NOT DETECTED Final  MRSA PCR Screening     Status: None   Collection Time: 12/12/16  2:17 AM  Result Value Ref Range Status   MRSA by PCR NEGATIVE NEGATIVE Final    Comment:        The GeneXpert MRSA Assay (FDA approved for NASAL specimens only), is one component of a comprehensive MRSA colonization surveillance program. It is not intended to diagnose MRSA infection nor to guide or monitor treatment for MRSA infections.     Radiology Reports Dg Abd 1 View  Result Date: 12/12/2016 CLINICAL DATA:  NG tube placement EXAM: ABDOMEN - 1 VIEW COMPARISON:  None. FINDINGS: Enteric tube tip is in the left upper quadrant consistent with location in the upper stomach. Emphysematous changes suggested in the lung bases. IMPRESSION: Enteric tube tip is in the left upper quadrant consistent with location in the upper stomach. Electronically Signed   By: Lucienne Capers M.D.   On: 12/12/2016 03:57   Ct Angio Chest Pe W Or Wo Contrast  Result Date: 12/12/2016 CLINICAL DATA:  Acute onset altered mental status. Confusion. Hypoxia. Initial encounter. EXAM: CT ANGIOGRAPHY CHEST WITH CONTRAST TECHNIQUE: Multidetector CT imaging of the chest was performed using the standard protocol during bolus administration of intravenous contrast. Multiplanar CT image reconstructions and MIPs were obtained to evaluate the  vascular anatomy. CONTRAST:  75 mL of Isovue 370 IV contrast COMPARISON:  Chest radiograph performed earlier today at 12:11 a.m., and CTA of the chest performed 08/25/2015 FINDINGS: Cardiovascular:  There is no evidence of pulmonary embolus. The heart is normal in size. The thoracic aorta is grossly unremarkable. The great vessels are within normal limits. Mediastinum/Nodes: The mediastinum is unremarkable in appearance. No mediastinal lymphadenopathy is seen. No pericardial effusion is  identified. The patient's endotracheal tube is seen ending 3 cm above the carina. The thyroid gland is unremarkable. No axillary lymphadenopathy is appreciated. Lungs/Pleura: Bilateral emphysema is noted. Minimal left basilar atelectasis or scarring is noted. No pleural effusion or pneumothorax is seen. No masses are identified. Upper Abdomen: The visualized portions of the liver and spleen are grossly unremarkable. Musculoskeletal: No acute osseous abnormalities are identified. The visualized musculature is unremarkable in appearance. There is suggestion of chronic disruption of the patient left breast implant. Review of the MIP images confirms the above findings. IMPRESSION: 1. No evidence of pulmonary embolus. 2. Bilateral emphysema noted. Minimal left basilar atelectasis or scarring noted. Electronically Signed   By: Garald Balding M.D.   On: 12/12/2016 02:20   Dg Chest Port 1 View  Result Date: 12/13/2016 CLINICAL DATA:  Respiratory failure. EXAM: PORTABLE CHEST 1 VIEW COMPARISON:  CT 12/12/2016. Chest x-ray 12/12/2016, 12/11/2016, 04/17/2016 FINDINGS: Endotracheal tube, NG tube in stable position. Heart size normal. Increased density noted over the lungs most likely related to overlying breast implants. Mild bilateral pleural-parenchymal thickening noted most likely related scarring. COPD . No pleural effusion or pneumothorax. Prior cervical spine fusion . IMPRESSION: 1.  Lines and tubes in stable position. 2. Pleuroparenchymal thickening consistent scarring. COPD. Similar findings noted on prior exam . No acute abnormality. Electronically Signed   By: [name]  Register   On: 12/13/2016 07:06   Dg Chest Portable 1 View  Result Date: 12/12/2016 CLINICAL DATA:  Post intubation EXAM: PORTABLE CHEST 1 VIEW COMPARISON:  12/11/2016 FINDINGS: Interval placement of an endotracheal tube with tip measuring 5.3 cm above the carina. Heart size and pulmonary vascularity are normal. Emphysematous changes in the lungs  with scattered fibrosis. No focal consolidation or airspace disease. No blunting of costophrenic angles. No pneumothorax. Postoperative changes in the cervical spine. IMPRESSION: Endotracheal tube tip measures 5.3 cm above the carina, in good apparent location. Electronically Signed   By: Lucienne Capers M.D.   On: 12/12/2016 00:39   Dg Chest Port 1 View  Result Date: 12/12/2016 CLINICAL DATA:  Altered mental status EXAM: PORTABLE CHEST 1 VIEW COMPARISON:  04/17/2016 FINDINGS: Cervical spine hardware. Hyperinflation with emphysematous disease and bullous change in the right greater than left upper lobes. No acute consolidation or effusion. Stable cardiomediastinal silhouette. No pneumothorax. IMPRESSION: Hyperinflation with emphysematous disease. No acute infiltrate or edema Electronically Signed   By: Donavan Foil M.D.   On: 12/12/2016 00:11     CBC  Recent Labs Lab 12/11/16 2252 12/12/16 0303 12/13/16 0545  WBC 11.7* 10.2 18.2*  HGB 11.2* 10.2* 8.3*  HCT 35.5 32.8* 26.7*  PLT 215 177 172  MCV 89.0 90.7 88.0  MCH 28.1 28.3 27.2  MCHC 31.5* 31.2* 30.9*  RDW 13.6 13.5 13.9  LYMPHSABS 1.3  --   --   MONOABS 0.7  --   --   EOSABS 0.0  --   --   BASOSABS 0.0  --   --     Chemistries   Recent Labs Lab 12/11/16 2251 12/12/16 0303 12/13/16 0545  NA 144 142 141  K 4.6 4.1 4.1  CL 92* 97* 104  CO2 43* 35* 34*  GLUCOSE 178* 250* 164*  BUN 33* 27* 36*  CREATININE 0.68 0.68 0.70  CALCIUM 9.8 8.6* 8.6*  MG 1.8 2.2 2.0  AST 58* 64*  --   ALT 31 32  --   ALKPHOS 100 86  --   BILITOT 0.9 0.8  --    ------------------------------------------------------------------------------------------------------------------ estimated creatinine clearance is 63.9 mL/min (by C-G formula based on SCr of 0.7 mg/dL). ------------------------------------------------------------------------------------------------------------------ No results for input(s): HGBA1C in the last 72  hours. ------------------------------------------------------------------------------------------------------------------ No results for input(s): CHOL, HDL, LDLCALC, TRIG, CHOLHDL, LDLDIRECT in the last 72 hours. ------------------------------------------------------------------------------------------------------------------ No results for input(s): TSH, T4TOTAL, T3FREE, THYROIDAB in the last 72 hours.  Invalid input(s): FREET3 ------------------------------------------------------------------------------------------------------------------ No results for input(s): VITAMINB12, FOLATE, FERRITIN, TIBC, IRON, RETICCTPCT in the last 72 hours.  Coagulation profile  Recent Labs Lab 12/12/16 0303  INR 0.98    No results for input(s): DDIMER in the last 72 hours.  Cardiac Enzymes No results for input(s): CKMB, TROPONINI, MYOGLOBIN in the last 168 hours.  Invalid input(s): CK ------------------------------------------------------------------------------------------------------------------ Invalid input(s): [place]   This is a 66 y.o. 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 Acute respiratory failure with hypoxia and hypercapnia, in a patient with home-O2 dependent COPD.   CT scan of the chest shows no pulmonary embolism Continue Mechanical ventilation per pulmonary.   #. Sepsis, with positive blood cultures - Continue IV antibiotics: Merrem, Vanco  #. History of fibromyalgia/depression -Hold Cymbalta - Chronic opiate use  #. History of hyperlipidemia - hold Lipitor  #. History of GERD - hold AcipHex  Updated husband on the condition and status     Code Status Orders        Start     Ordered   12/12/16 1202  Do not attempt resuscitation (DNR)  Continuous    Question Answer Comment  In the event of  cardiac or respiratory ARREST Do not call a "code blue"   In the event of cardiac or respiratory ARREST Do not perform Intubation, CPR, defibrillation or ACLS   In the event of cardiac or respiratory ARREST Use medication by any route, position, wound care, and other measures to relive pain and suffering. May use oxygen, suction and manual treatment of airway obstruction as needed for comfort.      12/12/16 1219    Code Status History    Date Active Date Inactive Code Status Order ID Comments User Context   12/12/2016  2:18 AM 12/12/2016 12:19 PM Full Code [id number]  HugelmeyerUbaldo Glassing, DO Inpatient   04/17/2016 11:13 PM 04/21/2016  6:10 PM Full Code [id number]  Holley Raring, NP ED    Advance Directive Documentation     Most Recent Value  Type of Advance Directive  Living will  Pre-existing out of facility DNR order (yellow form or pink MOST form)  -  "MOST" Form in Place?  -           Consults  Intensivists DVT Prophylaxis  Lovenox   Lab Results  Component Value Date   PLT 172 12/13/2016     Time Spent in minutes   66mn spent  Greater than 50% of time spent in care coordination and counseling patient regarding the condition and plan of care.  Dustin Flock M.D on 12/13/2016 at 1:58 PM  Between 7am to 6pm - Pager - [phone]  After 6pm go to [email or web address] - password [email or web address] [facility]   Office  [phone]

## 2016-12-13 NOTE — Progress Notes (Signed)
Pt in stable condition at this time. Pt remains on ventilator, sedated with Precedex, and Propofol. Full assessment in EPIC. Report given to [facility], RN

## 2016-12-13 NOTE — Progress Notes (Signed)
PT Cancellation Note  Patient Details Name: Tiffany Mclean MRN: [id number] DOB: 01-25-1951   Cancelled Treatment:    Reason Eval/Treat Not Completed: Patient's level of consciousness;Medical issues which prohibited therapy Spoke with nursing who reports that pt is not appropriate for PT today, requests for [place] not to see her.    Kreg Shropshire, DPT 12/13/2016, 11:04 AM

## 2016-12-14 DIAGNOSIS — J9601 Acute respiratory failure with hypoxia: Secondary | ICD-10-CM | POA: Diagnosis not present

## 2016-12-14 DIAGNOSIS — J9602 Acute respiratory failure with hypercapnia: Secondary | ICD-10-CM | POA: Diagnosis not present

## 2016-12-14 DIAGNOSIS — G9341 Metabolic encephalopathy: Secondary | ICD-10-CM | POA: Diagnosis not present

## 2016-12-14 LAB — GLUCOSE, CAPILLARY
GLUCOSE-CAPILLARY: 123 mg/dL — AB (ref 65–99)
GLUCOSE-CAPILLARY: 129 mg/dL — AB (ref 65–99)
GLUCOSE-CAPILLARY: 165 mg/dL — AB (ref 65–99)
Glucose-Capillary: 114 mg/dL — ABNORMAL HIGH (ref 65–99)
Glucose-Capillary: 136 mg/dL — ABNORMAL HIGH (ref 65–99)
Glucose-Capillary: 144 mg/dL — ABNORMAL HIGH (ref 65–99)
Glucose-Capillary: 146 mg/dL — ABNORMAL HIGH (ref 65–99)

## 2016-12-14 LAB — CULTURE, BLOOD (ROUTINE X 2): SPECIAL REQUESTS: ADEQUATE

## 2016-12-14 LAB — BASIC METABOLIC PANEL
Anion gap: 4 — ABNORMAL LOW (ref 5–15)
BUN: 31 mg/dL — AB (ref 6–20)
CHLORIDE: 105 mmol/L (ref 101–111)
CO2: 33 mmol/L — AB (ref 22–32)
Calcium: 8.7 mg/dL — ABNORMAL LOW (ref 8.9–10.3)
Creatinine, Ser: 0.51 mg/dL (ref 0.44–1.00)
GFR calc Af Amer: >60 mL/min (ref >60)
GFR calc non Af Amer: >60 mL/min (ref >60)
Glucose, Bld: 118 mg/dL — ABNORMAL HIGH (ref 65–99)
POTASSIUM: 4.5 mmol/L (ref 3.5–5.1)
SODIUM: 142 mmol/L (ref 135–145)

## 2016-12-14 LAB — PROCALCITONIN: Procalcitonin: <0.1 ng/mL

## 2016-12-14 LAB — PHOSPHORUS: PHOSPHORUS: 2.4 mg/dL — AB (ref 2.5–4.6)

## 2016-12-14 MED ORDER — POTASSIUM PHOSPHATES 15 MMOLE/5ML IV SOLN
10.0000 mmol | Freq: Once | INTRAVENOUS | Status: AC
  Administered 2016-12-14: 10 mmol via INTRAVENOUS
  Filled 2016-12-14: qty 3.33

## 2016-12-14 MED ORDER — DEXTROSE 5 % IV SOLN
INTRAVENOUS | Status: DC
  Administered 2016-12-14 – 2016-12-15 (×4): via INTRAVENOUS
  Filled 2016-12-14 (×16): qty 50

## 2016-12-14 NOTE — Progress Notes (Signed)
[place] at [facility]                                                                                                                                                                                  Patient Demographics   Tiffany Mclean, is a 66 y.o. female, DOB - [birth year], IDP:[id number]  Admit date - 12/11/2016   Admitting Physician Harvie Bridge, DO  Outpatient Primary MD for the patient is Birdie Sons, MD   LOS - 2  Subjective: Patient gets restless when sedation is decreased remains on the ventilator  Review of Systems:   CONSTITUTIONAL: Intubated  Vitals:   Vitals:   12/14/16 0900 12/14/16 1000 12/14/16 1100 12/14/16 1413  BP: 130/77 (!) 150/99 (!) 99/58   Pulse: (!) 104 (!) 115 88   Resp: (!) 21 (!) 22 19   Temp: 98.6 F (37 C) 98.6 F (37 C) 99 F (37.2 C)   TempSrc:      SpO2: 96% 99% 100% 98%  Weight:      Height:        Wt Readings from Last 3 Encounters:  12/12/16 132 lb 0.9 oz (59.9 kg)  05/30/16 132 lb (59.9 kg)  04/21/16 136 lb (61.7 kg)     Intake/Output Summary (Last 24 hours) at 12/14/16 1441 Last data filed at 12/14/16 0800  Gross per 24 hour  Intake          2825.81 ml  Output             1063 ml  Net          1762.81 ml    Physical Exam:   GENERAL: Intubated critically ill HEAD, EYES, EARS, NOSE AND THROAT: Atraumatic, normocephalic. Extraocular muscles are intact. Pupils equal and reactive to light. Sclerae anicteric. No conjunctival injection. No oro-pharyngeal erythema.  NECK: Supple. There is no jugular venous distention. No bruits, no lymphadenopathy, no thyromegaly.  HEART: Regular rate and rhythm,. No murmurs, no rubs, no clicks.  LUNGS: Decreased breath sounds bilaterally on the ventilator ABDOMEN: Soft, flat, nontender, nondistended. Has good bowel sounds. No hepatosplenomegaly appreciated.  EXTREMITIES: No evidence of any cyanosis, clubbing, or peripheral edema.  +2 pedal and radial  pulses bilaterally.  NEUROLOGIC: The patient is alert, awake, and oriented x3 with no focal motor or sensory deficits appreciated bilaterally.  SKIN: Moist and warm with no rashes appreciated.  Psych: Not anxious, depressed LN: No inguinal LN enlargement    Antibiotics   Anti-infectives    Start     Dose/Rate Route Frequency Ordered Stop   12/13/16 0400  vancomycin (VANCOCIN) IVPB 1000 mg/200 mL premix  Status:  Discontinued  1,000 mg 200 mL/hr over 60 Minutes Intravenous Every 12 hours 12/12/16 1922 12/13/16 1110   12/12/16 2000  vancomycin (VANCOCIN) IVPB 1000 mg/200 mL premix     1,000 mg 200 mL/hr over 60 Minutes Intravenous  Once 12/12/16 1921 12/12/16 2108   12/12/16 1000  vancomycin (VANCOCIN) IVPB 1000 mg/200 mL premix  Status:  Discontinued     1,000 mg 200 mL/hr over 60 Minutes Intravenous Every 12 hours 12/12/16 0330 12/12/16 1219   12/12/16 0115  vancomycin (VANCOCIN) IVPB 1000 mg/200 mL premix     1,000 mg 200 mL/hr over 60 Minutes Intravenous  Once 12/12/16 0110 12/12/16 0336   12/11/16 2338  meropenem (MERREM) 500 MG/50ML IVPB SOLR    Comments:  David, Raquel: cabinet override      12/11/16 2338 12/12/16 1144   12/11/16 2330  meropenem (MERREM) 1 g in sodium chloride 0.9 % 100 mL IVPB     1 g 200 mL/hr over 30 Minutes Intravenous Every 8 hours 12/11/16 2316        Medications   Scheduled Meds: . budesonide (PULMICORT) nebulizer solution  0.25 mg Nebulization Q6H  . chlorhexidine gluconate (MEDLINE KIT)  15 mL Mouth Rinse BID  . enoxaparin (LOVENOX) injection  40 mg Subcutaneous Q24H  . free water  200 mL Per Tube Q8H  . insulin aspart  0-15 Units Subcutaneous Q4H  . ipratropium-albuterol  3 mL Nebulization Q6H  . mouth rinse  15 mL Mouth Rinse QID  . methylPREDNISolone (SOLU-MEDROL) injection  40 mg Intravenous Q12H  . pantoprazole (PROTONIX) IV  40 mg Intravenous Q24H   Continuous Infusions: . sodium chloride 50 mL/hr at 12/13/16 1807  . dextrose 5 %  50 mL with dexmedetomidine (PRECEDEX) 200 mcg infusion 11.2 mL/hr at 12/14/16 1329  . feeding supplement (VITAL AF 1.2 CAL) 1,000 mL (12/14/16 1329)  . meropenem (MERREM) IV 1 g (12/14/16 1329)  . propofol (DIPRIVAN) infusion 40 mcg/kg/min (12/14/16 0606)   PRN Meds:.acetaminophen **OR** acetaminophen, albuterol, bisacodyl, bisacodyl, fentaNYL (SUBLIMAZE) injection, magnesium citrate, midazolam, [DISCONTINUED] ondansetron **OR** ondansetron (ZOFRAN) IV, senna-docusate, sennosides   Data Review:   Micro Results Recent Results (from the past 240 hour(s))  Blood Culture (routine x 2)     Status: Abnormal   Collection Time: 12/11/16 10:52 PM  Result Value Ref Range Status   Specimen Description BLOOD RT HAND  Final   Special Requests   Final    BOTTLES DRAWN AEROBIC AND ANAEROBIC Blood Culture adequate volume   Culture  Setup Time   Final    GRAM POSITIVE COCCI GRAM POSITIVE RODS IN BOTH AEROBIC AND ANAEROBIC BOTTLES CRITICAL RESULT CALLED TO, READ BACK BY AND VERIFIED WITH: JASON ROBBINS AT 2505 12/12/2016 BY TFK. JASON ROBBINS AT 2120 ON 12/12/2016 JJB    Culture (A)  Final    STAPHYLOCOCCUS SPECIES (COAGULASE NEGATIVE) THE SIGNIFICANCE OF ISOLATING THIS ORGANISM FROM A SINGLE SET OF BLOOD CULTURES WHEN MULTIPLE SETS ARE DRAWN IS UNCERTAIN. PLEASE NOTIFY THE MICROBIOLOGY DEPARTMENT WITHIN ONE WEEK IF SPECIATION AND SENSITIVITIES ARE REQUIRED. DIPHTHEROIDS(CORYNEBACTERIUM SPECIES) Standardized susceptibility testing for this organism is not available. Performed at [facility], [place] [address] [place], [place] [address]    Report Status 12/14/2016 FINAL  Final  Blood Culture (routine x 2)     Status: None (Preliminary result)   Collection Time: 12/11/16 10:52 PM  Result Value Ref Range Status   Specimen Description BLOOD L FA  Final   Special Requests BOTTLES DRAWN AEROBIC AND ANAEROBIC  [place]  Final   Culture NO GROWTH 3 DAYS  Final   Report Status PENDING  Incomplete  Urine  culture     Status: None   Collection Time: 12/11/16 10:52 PM  Result Value Ref Range Status   Specimen Description URINE, RANDOM  Final   Special Requests NONE  Final   Culture   Final    NO GROWTH Performed at [facility], [place] [address], [place], [place] [address]    Report Status 12/13/2016 FINAL  Final  Blood Culture ID Panel (Reflexed)     Status: Abnormal   Collection Time: 12/11/16 10:52 PM  Result Value Ref Range Status   Enterococcus species NOT DETECTED NOT DETECTED Final   Listeria monocytogenes NOT DETECTED NOT DETECTED Final   Staphylococcus species DETECTED (A) NOT DETECTED Final    Comment: Methicillin (oxacillin) resistant coagulase negative staphylococcus. Possible blood culture contaminant (unless isolated from more than one blood culture draw or clinical case suggests pathogenicity). No antibiotic treatment is indicated for blood  culture contaminants. CRITICAL RESULT CALLED TO, READ BACK BY AND VERIFIED WITH: JASON ROBBINS AT 1808 12/12/2016 BY TFK.    Staphylococcus aureus NOT DETECTED NOT DETECTED Final   Methicillin resistance DETECTED (A) NOT DETECTED Final    Comment: CRITICAL RESULT CALLED TO, READ BACK BY AND VERIFIED WITH: JASON ROBBINS AT 3491 12/12/2016 BY TFK.    Streptococcus species NOT DETECTED NOT DETECTED Final   Streptococcus agalactiae NOT DETECTED NOT DETECTED Final   Streptococcus pneumoniae NOT DETECTED NOT DETECTED Final   Streptococcus pyogenes NOT DETECTED NOT DETECTED Final   Acinetobacter baumannii NOT DETECTED NOT DETECTED Final   Enterobacteriaceae species NOT DETECTED NOT DETECTED Final   Enterobacter cloacae complex NOT DETECTED NOT DETECTED Final   Escherichia coli NOT DETECTED NOT DETECTED Final   Klebsiella oxytoca NOT DETECTED NOT DETECTED Final   Klebsiella pneumoniae NOT DETECTED NOT DETECTED Final   Proteus species NOT DETECTED NOT DETECTED Final   Serratia marcescens NOT DETECTED NOT DETECTED Final   Haemophilus  influenzae NOT DETECTED NOT DETECTED Final   Neisseria meningitidis NOT DETECTED NOT DETECTED Final   Pseudomonas aeruginosa NOT DETECTED NOT DETECTED Final   Candida albicans NOT DETECTED NOT DETECTED Final   Candida glabrata NOT DETECTED NOT DETECTED Final   Candida krusei NOT DETECTED NOT DETECTED Final   Candida parapsilosis NOT DETECTED NOT DETECTED Final   Candida tropicalis NOT DETECTED NOT DETECTED Final  MRSA PCR Screening     Status: None   Collection Time: 12/12/16  2:17 AM  Result Value Ref Range Status   MRSA by PCR NEGATIVE NEGATIVE Final    Comment:        The GeneXpert MRSA Assay (FDA approved for NASAL specimens only), is one component of a comprehensive MRSA colonization surveillance program. It is not intended to diagnose MRSA infection nor to guide or monitor treatment for MRSA infections.     Radiology Reports Dg Abd 1 View  Result Date: 12/12/2016 CLINICAL DATA:  NG tube placement EXAM: ABDOMEN - 1 VIEW COMPARISON:  None. FINDINGS: Enteric tube tip is in the left upper quadrant consistent with location in the upper stomach. Emphysematous changes suggested in the lung bases. IMPRESSION: Enteric tube tip is in the left upper quadrant consistent with location in the upper stomach. Electronically Signed   By: Lucienne Capers M.D.   On: 12/12/2016 03:57   Ct Angio Chest Pe W Or Wo Contrast  Result Date: 12/12/2016 CLINICAL DATA:  Acute onset altered mental status. Confusion. Hypoxia. Initial encounter. EXAM: CT ANGIOGRAPHY CHEST WITH CONTRAST TECHNIQUE: Multidetector CT imaging of the chest was performed using the standard protocol during bolus administration of intravenous contrast. Multiplanar CT image reconstructions and MIPs were obtained to evaluate the vascular anatomy. CONTRAST:  75 mL of Isovue 370 IV contrast COMPARISON:  Chest radiograph performed earlier today at 12:11 a.m., and CTA of the chest performed 08/25/2015 FINDINGS: Cardiovascular:  There is no  evidence of pulmonary embolus. The heart is normal in size. The thoracic aorta is grossly unremarkable. The great vessels are within normal limits. Mediastinum/Nodes: The mediastinum is unremarkable in appearance. No mediastinal lymphadenopathy is seen. No pericardial effusion is identified. The patient's endotracheal tube is seen ending 3 cm above the carina. The thyroid gland is unremarkable. No axillary lymphadenopathy is appreciated. Lungs/Pleura: Bilateral emphysema is noted. Minimal left basilar atelectasis or scarring is noted. No pleural effusion or pneumothorax is seen. No masses are identified. Upper Abdomen: The visualized portions of the liver and spleen are grossly unremarkable. Musculoskeletal: No acute osseous abnormalities are identified. The visualized musculature is unremarkable in appearance. There is suggestion of chronic disruption of the patient left breast implant. Review of the MIP images confirms the above findings. IMPRESSION: 1. No evidence of pulmonary embolus. 2. Bilateral emphysema noted. Minimal left basilar atelectasis or scarring noted. Electronically Signed   By: Garald Balding M.D.   On: 12/12/2016 02:20   Dg Chest Port 1 View  Result Date: 12/13/2016 CLINICAL DATA:  Respiratory failure. EXAM: PORTABLE CHEST 1 VIEW COMPARISON:  CT 12/12/2016. Chest x-ray 12/12/2016, 12/11/2016, 04/17/2016 FINDINGS: Endotracheal tube, NG tube in stable position. Heart size normal. Increased density noted over the lungs most likely related to overlying breast implants. Mild bilateral pleural-parenchymal thickening noted most likely related scarring. COPD . No pleural effusion or pneumothorax. Prior cervical spine fusion . IMPRESSION: 1.  Lines and tubes in stable position. 2. Pleuroparenchymal thickening consistent scarring. COPD. Similar findings noted on prior exam . No acute abnormality. Electronically Signed   By: [name]  Register   On: 12/13/2016 07:06   Dg Chest Portable 1 View  Result  Date: 12/12/2016 CLINICAL DATA:  Post intubation EXAM: PORTABLE CHEST 1 VIEW COMPARISON:  12/11/2016 FINDINGS: Interval placement of an endotracheal tube with tip measuring 5.3 cm above the carina. Heart size and pulmonary vascularity are normal. Emphysematous changes in the lungs with scattered fibrosis. No focal consolidation or airspace disease. No blunting of costophrenic angles. No pneumothorax. Postoperative changes in the cervical spine. IMPRESSION: Endotracheal tube tip measures 5.3 cm above the carina, in good apparent location. Electronically Signed   By: Lucienne Capers M.D.   On: 12/12/2016 00:39   Dg Chest Port 1 View  Result Date: 12/12/2016 CLINICAL DATA:  Altered mental status EXAM: PORTABLE CHEST 1 VIEW COMPARISON:  04/17/2016 FINDINGS: Cervical spine hardware. Hyperinflation with emphysematous disease and bullous change in the right greater than left upper lobes. No acute consolidation or effusion. Stable cardiomediastinal silhouette. No pneumothorax. IMPRESSION: Hyperinflation with emphysematous disease. No acute infiltrate or edema Electronically Signed   By: Donavan Foil M.D.   On: 12/12/2016 00:11     CBC  Recent Labs Lab 12/11/16 2252 12/12/16 0303 12/13/16 0545  WBC 11.7* 10.2 18.2*  HGB 11.2* 10.2* 8.3*  HCT 35.5 32.8* 26.7*  PLT 215 177 172  MCV 89.0 90.7 88.0  MCH 28.1 28.3 27.2  MCHC 31.5* 31.2* 30.9*  RDW 13.6 13.5 13.9  LYMPHSABS 1.3  --   --  MONOABS 0.7  --   --   EOSABS 0.0  --   --   BASOSABS 0.0  --   --     Chemistries   Recent Labs Lab 12/11/16 2251 12/12/16 0303 12/13/16 0545 12/14/16 0245  NA 144 142 141 142  K 4.6 4.1 4.1 4.5  CL 92* 97* 104 105  CO2 43* 35* 34* 33*  GLUCOSE 178* 250* 164* 118*  BUN 33* 27* 36* 31*  CREATININE 0.68 0.68 0.70 0.51  CALCIUM 9.8 8.6* 8.6* 8.7*  MG 1.8 2.2 2.0  --   AST 58* 64*  --   --   ALT 31 32  --   --   ALKPHOS 100 86  --   --   BILITOT 0.9 0.8  --   --     ------------------------------------------------------------------------------------------------------------------ estimated creatinine clearance is 63.9 mL/min (by C-G formula based on SCr of 0.51 mg/dL). ------------------------------------------------------------------------------------------------------------------ No results for input(s): HGBA1C in the last 72 hours. ------------------------------------------------------------------------------------------------------------------ No results for input(s): CHOL, HDL, LDLCALC, TRIG, CHOLHDL, LDLDIRECT in the last 72 hours. ------------------------------------------------------------------------------------------------------------------ No results for input(s): TSH, T4TOTAL, T3FREE, THYROIDAB in the last 72 hours.  Invalid input(s): FREET3 ------------------------------------------------------------------------------------------------------------------ No results for input(s): VITAMINB12, FOLATE, FERRITIN, TIBC, IRON, RETICCTPCT in the last 72 hours.  Coagulation profile  Recent Labs Lab 12/12/16 0303  INR 0.98    No results for input(s): DDIMER in the last 72 hours.  Cardiac Enzymes No results for input(s): CKMB, TROPONINI, MYOGLOBIN in the last 168 hours.  Invalid input(s): CK ------------------------------------------------------------------------------------------------------------------ Invalid input(s): [place]   This is a 66 y.o. 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 Acute respiratory failure with hypoxia and hypercapnia, in a patient with home-O2 dependent COPD.   CT scan of the chest shows no pulmonary embolism Continue Mechanical ventilation per pulmonary. Will be difficult to wean due to her COPD   #. Sepsis, with positive blood cultures due to  coag-negative staph continue anabiotic - Continue IV antibiotics: Merrem,   #. History of fibromyalgia/depression -Hold Cymbalta - Chronic opiate use  #. History of hyperlipidemia - hold Lipitor  #. History of GERD - hold AcipHex  Updated husband on the condition and status     Code Status Orders        Start     Ordered   12/12/16 1202  Do not attempt resuscitation (DNR)  Continuous    Question Answer Comment  In the event of cardiac or respiratory ARREST Do not call a "code blue"   In the event of cardiac or respiratory ARREST Do not perform Intubation, CPR, defibrillation or ACLS   In the event of cardiac or respiratory ARREST Use medication by any route, position, wound care, and other measures to relive pain and suffering. May use oxygen, suction and manual treatment of airway obstruction as needed for comfort.      12/12/16 1219    Code Status History    Date Active Date Inactive Code Status Order ID Comments User Context   12/12/2016  2:18 AM 12/12/2016 12:19 PM Full Code [id number]  Harvie Bridge, DO Inpatient   04/17/2016 11:13 PM 04/21/2016  6:10 PM Full Code [id number]  Holley Raring, NP ED    Advance Directive Documentation     Most Recent Value  Type of Advance Directive  Living will  Pre-existing out  of facility DNR order (yellow form or pink MOST form)  -  "MOST" Form in Place?  -           Consults  Intensivists DVT Prophylaxis  Lovenox   Lab Results  Component Value Date   PLT 172 12/13/2016     Time Spent in minutes   71mn spent  Greater than 50% of time spent in care coordination and counseling patient regarding the condition and plan of care.   PDustin FlockM.D on 12/14/2016 at 2:41 PM  Between 7am to 6pm - Pager - [phone]  After 6pm go to [email or web address] - password [email or web address]   Office  [phone]

## 2016-12-14 NOTE — Progress Notes (Signed)
Pharmacy Antibiotic Note/Electrolyte Monitoring  Tiffany Mclean is a 66 y.o. female with a h/o oxygen-dependient COPD admitted on 12/11/2016 with acute respiratory failure.  Pharmacy has been consulted for meropenem dosing as well as electrolyte monitoring and replacement.   Plan: 1. After discussion with Dr. Jefferson Fuel, will d/c vancomycin with MRSA PCR negative and 1/2 blood cultures growing CNS.   Continue meropenem 1 g iv q 8 hours.   2. K 4.5,  Phos 2.4. Potassium phosphate 10 mmol iv once and will f/u AM labs.     Height: 5\' 6"  (167.6 cm) Weight: 132 lb 0.9 oz (59.9 kg) IBW/kg (Calculated) : 59.3  Temp (24hrs), Avg:97.9 F (36.6 C), Min:96.8 F (36 C), Max:99 F (37.2 C)   Recent Labs Lab 12/11/16 2251 12/11/16 2252 12/12/16 0303 12/12/16 0705 12/12/16 2126 12/13/16 0545 12/14/16 0245  WBC  --  11.7* 10.2  --   --  18.2*  --   CREATININE 0.68  --  0.68  --   --  0.70 0.51  LATICACIDVEN  --  3.5* 5.1* 6.2* 1.1 1.1  --     Estimated Creatinine Clearance: 63.9 mL/min (by C-G formula based on SCr of 0.51 mg/dL).    BMP Latest Ref Rng & Units 12/14/2016 12/13/2016 12/12/2016  Glucose 65 - 99 mg/dL 118(H) 164(H) 250(H)  BUN 6 - 20 mg/dL 31(H) 36(H) 27(H)  Creatinine 0.44 - 1.00 mg/dL 0.51 0.70 0.68  BUN/Creat Ratio 12 - 28 - - -  Sodium 135 - 145 mmol/L 142 141 142  Potassium 3.5 - 5.1 mmol/L 4.5 4.1 4.1  Chloride 101 - 111 mmol/L 105 104 97(L)  CO2 22 - 32 mmol/L 33(H) 34(H) 35(H)  Calcium 8.9 - 10.3 mg/dL 8.7(L) 8.6(L) 8.6(L)   Magnesium (mg/dL)  Date Value  12/13/2016 2.0  11/18/2014 1.6 (L)   Phosphorus (mg/dL)  Date Value  12/14/2016 2.4 (L)  11/18/2014 2.5   Albumin (g/dL)  Date Value  12/12/2016 3.2 (L)  06/21/2016 3.8  11/10/2014 2.9 (L)     Allergies  Allergen Reactions  . Asa [Aspirin]   . Avelox [Moxifloxacin]   . Cephalexin   . Codeine   . Erythromycin Ethylsuccinate   . Flagyl [Metronidazole]   . Latex   . Levaquin [Levofloxacin]  Hives  . Meloxicam   . Shellfish Allergy Nausea And Vomiting and Other (See Comments)    Dizzy, fainting  . Sulfa Antibiotics   . Topiramate   . Doxycycline Rash  . Tetracyclines & Related Rash    Antimicrobials this admission: meropenem 5/24 >>  vancomycin 5/24 >>   Dose adjustments this admission:   Microbiology results: 5/23 BCx: CNS 1/2 5/23 UCx: NG  Sputum: sent  5/24 MRSA PCR: negative  Thank you for allowing pharmacy to be a part of this patient's care.  Lessie Funderburke A 12/14/2016 7:38 AM

## 2016-12-14 NOTE — Progress Notes (Signed)
PT Cancellation Note  Patient Details Name: Tiffany Mclean MRN: [id number] DOB: 1951/02/13   Cancelled Treatment:    Reason Eval/Treat Not Completed: Medical issues which prohibited therapy (Patient remains intubated and sedated; unable to actively participate with therapy session.  No family in room for education at this time.  Have attempted x3 times at this point without success. Of note, patient not tolerating attempts to wean vent; plans for family meeting with palliative to discuss further goals of care on Monday.  Will complete initial PT consult at this time; please reconsult as medically appropriate pending determination of goals of care.)   Santosh Petter H. Owens Shark, PT, DPT, NCS 12/14/16, 12:00 PM [phone]

## 2016-12-14 NOTE — Progress Notes (Signed)
Pt care assumed at this time. Report received.

## 2016-12-14 NOTE — Progress Notes (Signed)
PULMONARY / CRITICAL CARE MEDICINE   Name: Tiffany Mclean MRN: [id number] DOB: [birth year]    ADMISSION DATE:  12/11/2016   CONSULTATION DATE:  12/11/2016  REFERRING MD:  Hugelmelyer  Reason: Acute hypercarbic respiratory failure  CHIEF COMPLAINT:  Unresponsive  HISTORY OF PRESENT ILLNESS:   66 yo female admitted 05/23 with acute on chronic hypercapnic hypoxic respiratory failure secondary to AECOPD, acute encephalopathy, lactic acidosis, and ?sepsis requiring mechanical intubation  REVIEW OF SYSTEMS:   Unable to obtain as patient is currently intubated  SUBJECTIVE:  Pt remain intubated attempted spontaneous breathing trial this am pt did not tolerate extremely agitated, tachycardic, and hypoxic   VITAL SIGNS: BP (!) 145/129   Pulse 92   Temp 98.2 F (36.8 C)   Resp 20   Ht 5\' 6"  (1.676 m)   Wt 59.9 kg (132 lb 0.9 oz)   SpO2 98%   BMI 21.31 kg/m   HEMODYNAMICS:    VENTILATOR SETTINGS: Vent Mode: PRVC FiO2 (%):  [28 %-35 %] 28 % Set Rate:  [15 bmp] 15 bmp Vt Set:  [450 mL-500 mL] 450 mL PEEP:  [5 cmH20] 5 cmH20 Pressure Support:  [8 cmH20] 8 cmH20  INTAKE / OUTPUT: I/O last 3 completed shifts: In: 6371.5 [I.V.:2533.4; NG/GT:2331.4; IV Piggyback:1506.7] Out: 3825 [Urine:1748]  PHYSICAL EXAMINATION: General: acutely ill appearing Caucasian female, NAD mechanically intubated  Neuro: sedated, moves upper and lower extremities, withdraws from painful stimulation, PERRL HEENT: trachea midline, no JVD Cardiovascular: nsr, s1s2, no M/R/G Lungs: Diminished breath sounds throughout, no wheezes or rhonchi, even non labored  Abdomen: Soft, nontender, nondistended, +BS x4 Musculoskeletal:  No joint deformities, no edema Skin: Warm and dry  LABS:  BMET  Recent Labs Lab 12/12/16 0303 12/13/16 0545 12/14/16 0245  NA 142 141 142  K 4.1 4.1 4.5  CL 97* 104 105  CO2 35* 34* 33*  BUN 27* 36* 31*  CREATININE 0.68 0.70 0.51  GLUCOSE 250* 164* 118*     Electrolytes  Recent Labs Lab 12/11/16 2251 12/12/16 0303 12/13/16 0545 12/14/16 0245  CALCIUM 9.8 8.6* 8.6* 8.7*  MG 1.8 2.2 2.0  --   PHOS  --  2.1* 1.1* 2.4*    CBC  Recent Labs Lab 12/11/16 2252 12/12/16 0303 12/13/16 0545  WBC 11.7* 10.2 18.2*  HGB 11.2* 10.2* 8.3*  HCT 35.5 32.8* 26.7*  PLT 215 177 172    Coag's  Recent Labs Lab 12/12/16 0303  APTT <24*  INR 0.98    Sepsis Markers  Recent Labs Lab 12/12/16 0303 12/12/16 0705 12/12/16 2126 12/13/16 0545 12/14/16 0245  LATICACIDVEN 5.1* 6.2* 1.1 1.1  --   PROCALCITON <0.10  --   --  <0.10 <0.10    ABG  Recent Labs Lab 12/12/16 0105 12/12/16 0458  PHART 7.00* 7.40  PCO2ART 120* 55*  PO2ART 156* 263*    Liver Enzymes  Recent Labs Lab 12/11/16 2251 12/12/16 0303  AST 58* 64*  ALT 31 32  ALKPHOS 100 86  BILITOT 0.9 0.8  ALBUMIN 3.9 3.2*    Cardiac Enzymes No results for input(s): TROPONINI, PROBNP in the last 168 hours.  Glucose  Recent Labs Lab 12/13/16 1152 12/13/16 1532 12/13/16 1942 12/13/16 2334 12/14/16 0355 12/14/16 0755  GLUCAP 110* 183* 150* 116* 114* 146*    Imaging No results found.  STUDIES:  CT Angio Chest 05/24>>No evidence of pulmonary embolus. Bilateral emphysema noted. Minimal left basilar atelectasis or scarring noted.  CULTURES: Blood cultures 2 05/23>>negative x1 bottle,  staph species x1 bottle likely contaminate  Urine culture 05/23>>negative  Sputum>>  ANTIBIOTICS: Vancomycin 05/241 Meropenem 05/24 >>   SIGNIFICANT EVENTS: 12/12/16: Admitted with acute hypercarbic respiratory failure mechanically intubated   LINES/TUBES: Peripheral IVs  DISCUSSION: This is a 66 year old Caucasian female presenting with acute hypercarbic/hypoxic respiratory failure, sepsis, septic shock, acute encephalopathy and lactic acidosis  ASSESSMENT / PLAN:  PULMONARY A: Acute hypercarbic/hypoxic respiratory failure Acute on chronic COPD  exacerbation Pulmonary embolism ruled out P:   Full vent support with current settings Maintain O2 sats 88% to 92% Chest x-ray and ABG when necessary Nebulized bronchodilator and steroids VAP Protocol Attempt SBT again in am 05/26  CARDIOVASCULAR A:  Hypotension-resolved with fluid resuscitation Elevated lactate-resolved  P:  NS Hemodynamic monitoring per ICU We'll hold off on central venous catheter since patient's blood pressure stable  GASTROINTESTINAL A:   No acute issues P:   TF protocol  PPI for GI prophylaxis  HEMATOLOGIC A:   Mild anemia P:  Trend CBC and transfuse for hemoglobin less than 7 Lovenox for VTE prophylaxis  INFECTIOUS A:   Possible sepsis of unknown etiology although unlikely  P:   Broad-spectrum antibiotics. Follow-up blood and urine cultures Trend pro-calcitonin Trend WBC and monitor fever curve  ENDOCRINE A:   History of type 2 diabetes P:   Blood glucose monitoring with sliding scale insulin coverage  NEUROLOGIC A:   Acute hypercarbic and metabolic encephalopathy P:   RASS goal: -1 to -2 Will start Precedex gtt to maintain RASS goal, prn fentanyl for pain management  WUA daily  FAMILY  Updated husband this morning. Per Palliative Care consult plans to meet with pts husband again on Monday with the extended family to readdress goals of care.   Critical care time 40 minutes, mechanical ventilation, respiratory failure  Hermelinda Dellen, D.O.

## 2016-12-15 ENCOUNTER — Inpatient Hospital Stay: Payer: BLUE CROSS/BLUE SHIELD

## 2016-12-15 DIAGNOSIS — J9601 Acute respiratory failure with hypoxia: Secondary | ICD-10-CM | POA: Diagnosis not present

## 2016-12-15 DIAGNOSIS — J9602 Acute respiratory failure with hypercapnia: Secondary | ICD-10-CM | POA: Diagnosis not present

## 2016-12-15 DIAGNOSIS — G9341 Metabolic encephalopathy: Secondary | ICD-10-CM | POA: Diagnosis not present

## 2016-12-15 LAB — BASIC METABOLIC PANEL
Anion gap: 5 (ref 5–15)
BUN: 28 mg/dL — ABNORMAL HIGH (ref 6–20)
CO2: 35 mmol/L — AB (ref 22–32)
Calcium: 8.5 mg/dL — ABNORMAL LOW (ref 8.9–10.3)
Chloride: 107 mmol/L (ref 101–111)
Creatinine, Ser: 0.41 mg/dL — ABNORMAL LOW (ref 0.44–1.00)
GLUCOSE: 141 mg/dL — AB (ref 65–99)
POTASSIUM: 4.9 mmol/L (ref 3.5–5.1)
Sodium: 147 mmol/L — ABNORMAL HIGH (ref 135–145)

## 2016-12-15 LAB — GLUCOSE, CAPILLARY
GLUCOSE-CAPILLARY: 132 mg/dL — AB (ref 65–99)
GLUCOSE-CAPILLARY: 132 mg/dL — AB (ref 65–99)
GLUCOSE-CAPILLARY: 133 mg/dL — AB (ref 65–99)
GLUCOSE-CAPILLARY: 128 mg/dL — AB (ref 65–99)
Glucose-Capillary: 152 mg/dL — ABNORMAL HIGH (ref 65–99)

## 2016-12-15 LAB — PHOSPHORUS: Phosphorus: 2.1 mg/dL — ABNORMAL LOW (ref 2.5–4.6)

## 2016-12-15 MED ORDER — SODIUM CHLORIDE 0.9 % IV BOLUS (SEPSIS)
250.0000 mL | Freq: Once | INTRAVENOUS | Status: AC
  Administered 2016-12-15: 250 mL via INTRAVENOUS

## 2016-12-15 MED ORDER — DEXTROSE 5 % IV SOLN
INTRAVENOUS | Status: DC
  Administered 2016-12-15 – 2016-12-25 (×10): via INTRAVENOUS

## 2016-12-15 MED ORDER — NOREPINEPHRINE BITARTRATE 1 MG/ML IV SOLN
0.0000 ug/min | INTRAVENOUS | Status: DC
  Administered 2016-12-16: 2 ug/min via INTRAVENOUS
  Administered 2016-12-17: 5 ug/min via INTRAVENOUS
  Administered 2016-12-17: 4 ug/min via INTRAVENOUS
  Filled 2016-12-15 (×3): qty 4

## 2016-12-15 MED ORDER — MIDAZOLAM HCL 5 MG/ML IJ SOLN
0.5000 mg/h | INTRAMUSCULAR | Status: DC
  Administered 2016-12-15: 2.5 mg/h via INTRAVENOUS
  Administered 2016-12-15: 0.5 mg/h via INTRAVENOUS
  Administered 2016-12-16: 2.5 mg/h via INTRAVENOUS
  Administered 2016-12-18: 4.5 mg/h via INTRAVENOUS
  Administered 2016-12-18: 2 mg/h via INTRAVENOUS
  Filled 2016-12-15 (×9): qty 10

## 2016-12-15 MED ORDER — FENTANYL 2500MCG IN NS 250ML (10MCG/ML) PREMIX INFUSION
10.0000 ug/h | INTRAVENOUS | Status: AC
  Administered 2016-12-15: 300 ug/h via INTRAVENOUS
  Administered 2016-12-15: 25 ug/h via INTRAVENOUS
  Administered 2016-12-15 – 2016-12-16 (×4): 300 ug/h via INTRAVENOUS
  Administered 2016-12-17: 350 ug/h via INTRAVENOUS
  Administered 2016-12-17: 300 ug/h via INTRAVENOUS
  Administered 2016-12-18: 200 ug/h via INTRAVENOUS
  Administered 2016-12-18 (×2): 350 ug/h via INTRAVENOUS
  Administered 2016-12-19: 250 ug/h via INTRAVENOUS
  Filled 2016-12-15 (×13): qty 250

## 2016-12-15 MED ORDER — K PHOS MONO-SOD PHOS DI & MONO 155-852-130 MG PO TABS
500.0000 mg | ORAL_TABLET | ORAL | Status: AC
  Administered 2016-12-15 (×2): 500 mg
  Filled 2016-12-15 (×2): qty 2

## 2016-12-15 MED ORDER — VANCOMYCIN HCL IN DEXTROSE 1-5 GM/200ML-% IV SOLN
1000.0000 mg | Freq: Two times a day (BID) | INTRAVENOUS | Status: DC
  Administered 2016-12-15 – 2016-12-17 (×5): 1000 mg via INTRAVENOUS
  Filled 2016-12-15 (×6): qty 200

## 2016-12-15 NOTE — Progress Notes (Signed)
Pt remains sedated on the ventilator at 28% FiO2. Pt temperature slightly elevated, room cooled and blankets removed. Pt now sedated with Propofol, Fentanyl, and Versed. Pt arouses to pain. Full assessment in EPIC. Report given to [place].

## 2016-12-15 NOTE — Progress Notes (Signed)
Pharmacy Antibiotic Note/Electrolyte Monitoring  Tiffany Mclean is a 66 y.o. female with a h/o oxygen-dependient COPD admitted on 12/11/2016 with acute respiratory failure.  Pharmacy has been consulted for meropenem dosing as well as electrolyte monitoring and replacement.   Plan: 1. ABX:   5/27: Restart Vancomycin per MD: WBC increased on 5/25, temp 99.5 , no significant improvement. Reculture. Will restart on Vancomycin 1 gram IV q12h.  (Last dose of Vancomycin was given on 5/25 at 0453) Ke 0.057, t1/2 12.16  Vd 41.9  (using Scr of 0.80) Will check trough prior to 4th dose on 5/28  -Continue meropenem 1 g iv q 8 hours.   2.Electrolytes:  K 4.9, Na 147, Phos 2.1.  Will order KPhos neutral tablet 2 tabs po q4h x 2.   Will f/u Electrolytes with AM labs.     Height: 5\' 6"  (167.6 cm) Weight: 132 lb 0.9 oz (59.9 kg) IBW/kg (Calculated) : 59.3  Temp (24hrs), Avg:98.6 F (37 C), Min:97.5 F (36.4 C), Max:99.5 F (37.5 C)   Recent Labs Lab 12/11/16 2251 12/11/16 2252 12/12/16 0303 12/12/16 0705 12/12/16 2126 12/13/16 0545 12/14/16 0245 12/15/16 0433  WBC  --  11.7* 10.2  --   --  18.2*  --   --   CREATININE 0.68  --  0.68  --   --  0.70 0.51 0.41*  LATICACIDVEN  --  3.5* 5.1* 6.2* 1.1 1.1  --   --     Estimated Creatinine Clearance: 63.9 mL/min (A) (by C-G formula based on SCr of 0.41 mg/dL (L)).    BMP Latest Ref Rng & Units 12/15/2016 12/14/2016 12/13/2016  Glucose 65 - 99 mg/dL 141(H) 118(H) 164(H)  BUN 6 - 20 mg/dL 28(H) 31(H) 36(H)  Creatinine 0.44 - 1.00 mg/dL 0.41(L) 0.51 0.70  BUN/Creat Ratio 12 - 28 - - -  Sodium 135 - 145 mmol/L 147(H) 142 141  Potassium 3.5 - 5.1 mmol/L 4.9 4.5 4.1  Chloride 101 - 111 mmol/L 107 105 104  CO2 22 - 32 mmol/L 35(H) 33(H) 34(H)  Calcium 8.9 - 10.3 mg/dL 8.5(L) 8.7(L) 8.6(L)   Magnesium (mg/dL)  Date Value  12/13/2016 2.0  11/18/2014 1.6 (L)   Phosphorus (mg/dL)  Date Value  12/15/2016 2.1 (L)  11/18/2014 2.5    Albumin (g/dL)  Date Value  12/12/2016 3.2 (L)  06/21/2016 3.8  11/10/2014 2.9 (L)     Allergies  Allergen Reactions  . Asa [Aspirin]   . Avelox [Moxifloxacin]   . Cephalexin   . Codeine   . Erythromycin Ethylsuccinate   . Flagyl [Metronidazole]   . Latex   . Levaquin [Levofloxacin] Hives  . Meloxicam   . Shellfish Allergy Nausea And Vomiting and Other (See Comments)    Dizzy, fainting  . Sulfa Antibiotics   . Topiramate   . Doxycycline Rash  . Tetracyclines & Related Rash    Antimicrobials this admission: meropenem 5/24 >>  vancomycin 5/24 >> 5/25 Vancomycin 5/27 >>  Dose adjustments this admission:   Microbiology results: 5/23 BCx: CNS / diptheroids 2/4 5/23 UCx: NG  Sputum: sent  5/24 MRSA PCR: negative  Thank you for allowing pharmacy to be a part of this patient's care.  Toneshia Coello A 12/15/2016 8:32 AM

## 2016-12-15 NOTE — Progress Notes (Signed)
Pt becoming more anxious and agitated throughout shift, moving around in bed and not in dyssynchronous with Ventilator. Several pushes of Versed and Fentanyl. MD on call present on unit. Assessed, verbal order to change pt back to fentanyl gtt, and wean off Precedex. Fentanyl started back, Precedex weaned down and off. Pt resting comfortably. Will continue to monitor.

## 2016-12-15 NOTE — Progress Notes (Signed)
[place] at [facility]                                                                                                                                                                                  Patient Demographics   Tiffany Mclean, is a 66 y.o. female, DOB - [birth year], MEQ:[id number]  Admit date - 12/11/2016   Admitting Physician Harvie Bridge, DO  Outpatient Primary MD for the patient is Fisher, Kirstie Peri, MD   LOS - 3  Subjective: Patient remains on the ventilator. FiO2 a good lives currently sedated  Review of Systems:   CONSTITUTIONAL: Intubated  Vitals:   Vitals:   12/15/16 1130 12/15/16 1200 12/15/16 1216 12/15/16 1219  BP:  (!) 76/47 (!) 88/48 (!) 82/53  Pulse: 82 74 78 78  Resp: _0 Temp: 99.9 F (37.7 C) 100 F (37.8 C) 100 F (37.8 C) 100 F (37.8 C)  TempSrc:  Core (Comment)    SpO2: 97% 96%    Weight:      Height:        Wt Readings from Last 3 Encounters:  12/12/16 132 lb 0.9 oz (59.9 kg)  05/30/16 132 lb (59.9 kg)  04/21/16 136 lb (61.7 kg)     Intake/Output Summary (Last 24 hours) at 12/15/16 1313 Last data filed at 12/15/16 1200  Gross per 24 hour  Intake          3485.47 ml  Output             3165 ml  Net           320.47 ml    Physical Exam:   GENERAL: Intubated critically ill HEAD, EYES, EARS, NOSE AND THROAT: Atraumatic, normocephalic. Extraocular muscles are intact. Pupils equal and reactive to light. Sclerae anicteric. No conjunctival injection. No oro-pharyngeal erythema.  NECK: Supple. There is no jugular venous distention. No bruits, no lymphadenopathy, no thyromegaly.  HEART: Regular rate and rhythm,. No murmurs, no rubs, no clicks.  LUNGS: Decreased breath sounds bilaterally on the ventilator ABDOMEN: Soft, flat, nontender, nondistended. Has good bowel sounds. No hepatosplenomegaly appreciated.  EXTREMITIES: No evidence of any cyanosis, clubbing, or peripheral edema.  +2 pedal and  radial pulses bilaterally.  NEUROLOGIC: Sedated  SKIN: Moist and warm with no rashes appreciated.  Psych: Sedated LN: No inguinal LN enlargement    Antibiotics   Anti-infectives    Start     Dose/Rate Route Frequency Ordered Stop   12/15/16 0900  vancomycin (VANCOCIN) IVPB 1000 mg/200 mL premix     1,000 mg 200 mL/hr over 60 Minutes Intravenous Every 12 hours 12/15/16 2979  12/13/16 0400  vancomycin (VANCOCIN) IVPB 1000 mg/200 mL premix  Status:  Discontinued     1,000 mg 200 mL/hr over 60 Minutes Intravenous Every 12 hours 12/12/16 1922 12/13/16 1110   12/12/16 2000  vancomycin (VANCOCIN) IVPB 1000 mg/200 mL premix     1,000 mg 200 mL/hr over 60 Minutes Intravenous  Once 12/12/16 1921 12/12/16 2108   12/12/16 1000  vancomycin (VANCOCIN) IVPB 1000 mg/200 mL premix  Status:  Discontinued     1,000 mg 200 mL/hr over 60 Minutes Intravenous Every 12 hours 12/12/16 0330 12/12/16 1219   12/12/16 0115  vancomycin (VANCOCIN) IVPB 1000 mg/200 mL premix     1,000 mg 200 mL/hr over 60 Minutes Intravenous  Once 12/12/16 0110 12/12/16 0336   12/11/16 2338  meropenem (MERREM) 500 MG/50ML IVPB SOLR    Comments:  David, Raquel: cabinet override      12/11/16 2338 12/12/16 1144   12/11/16 2330  meropenem (MERREM) 1 g in sodium chloride 0.9 % 100 mL IVPB     1 g 200 mL/hr over 30 Minutes Intravenous Every 8 hours 12/11/16 2316        Medications   Scheduled Meds: . budesonide (PULMICORT) nebulizer solution  0.25 mg Nebulization Q6H  . chlorhexidine gluconate (MEDLINE KIT)  15 mL Mouth Rinse BID  . enoxaparin (LOVENOX) injection  40 mg Subcutaneous Q24H  . free water  200 mL Per Tube Q8H  . insulin aspart  0-15 Units Subcutaneous Q4H  . ipratropium-albuterol  3 mL Nebulization Q6H  . mouth rinse  15 mL Mouth Rinse QID  . methylPREDNISolone (SOLU-MEDROL) injection  40 mg Intravenous Q12H  . pantoprazole (PROTONIX) IV  40 mg Intravenous Q24H  . phosphorus  500 mg Per Tube Q4H    Continuous Infusions: . dextrose 5 % 50 mL with dexmedetomidine (PRECEDEX) 200 mcg infusion Stopped (12/15/16 0500)  . dextrose 50 mL/hr at 12/15/16 1200  . feeding supplement (VITAL AF 1.2 CAL) 1,000 mL (12/15/16 1200)  . fentaNYL infusion INTRAVENOUS 250 mcg/hr (12/15/16 1250)  . meropenem (MERREM) IV Stopped (12/15/16 0610)  . midazolam (VERSED) infusion 1.5 mg/hr (12/15/16 1250)  . propofol (DIPRIVAN) infusion 20 mcg/kg/min (12/15/16 1250)  . vancomycin Stopped (12/15/16 1038)   PRN Meds:.acetaminophen **OR** acetaminophen, albuterol, bisacodyl, bisacodyl, fentaNYL (SUBLIMAZE) injection, magnesium citrate, midazolam, [DISCONTINUED] ondansetron **OR** ondansetron (ZOFRAN) IV, senna-docusate, sennosides   Data Review:   Micro Results Recent Results (from the past 240 hour(s))  Blood Culture (routine x 2)     Status: Abnormal   Collection Time: 12/11/16 10:52 PM  Result Value Ref Range Status   Specimen Description BLOOD RT HAND  Final   Special Requests   Final    BOTTLES DRAWN AEROBIC AND ANAEROBIC Blood Culture adequate volume   Culture  Setup Time   Final    GRAM POSITIVE COCCI GRAM POSITIVE RODS IN BOTH AEROBIC AND ANAEROBIC BOTTLES CRITICAL RESULT CALLED TO, READ BACK BY AND VERIFIED WITH: JASON ROBBINS AT 6712 12/12/2016 BY TFK. JASON ROBBINS AT 2120 ON 12/12/2016 JJB    Culture (A)  Final    STAPHYLOCOCCUS SPECIES (COAGULASE NEGATIVE) THE SIGNIFICANCE OF ISOLATING THIS ORGANISM FROM A SINGLE SET OF BLOOD CULTURES WHEN MULTIPLE SETS ARE DRAWN IS UNCERTAIN. PLEASE NOTIFY THE MICROBIOLOGY DEPARTMENT WITHIN ONE WEEK IF SPECIATION AND SENSITIVITIES ARE REQUIRED. DIPHTHEROIDS(CORYNEBACTERIUM SPECIES) Standardized susceptibility testing for this organism is not available. Performed at [facility], [place] [address] [place], [place] [address]    Report Status 12/14/2016 FINAL  Final  Blood Culture (routine x 2)     Status: None (Preliminary result)   Collection Time:  12/11/16 10:52 PM  Result Value Ref Range Status   Specimen Description BLOOD L FA  Final   Special Requests BOTTLES DRAWN AEROBIC AND ANAEROBIC [place]  Final   Culture NO GROWTH 4 DAYS  Final   Report Status PENDING  Incomplete  Urine culture     Status: None   Collection Time: 12/11/16 10:52 PM  Result Value Ref Range Status   Specimen Description URINE, RANDOM  Final   Special Requests NONE  Final   Culture   Final    NO GROWTH Performed at [facility], [place] [address], [place], [place] [address]    Report Status 12/13/2016 FINAL  Final  Blood Culture ID Panel (Reflexed)     Status: Abnormal   Collection Time: 12/11/16 10:52 PM  Result Value Ref Range Status   Enterococcus species NOT DETECTED NOT DETECTED Final   Listeria monocytogenes NOT DETECTED NOT DETECTED Final   Staphylococcus species DETECTED (A) NOT DETECTED Final    Comment: Methicillin (oxacillin) resistant coagulase negative staphylococcus. Possible blood culture contaminant (unless isolated from more than one blood culture draw or clinical case suggests pathogenicity). No antibiotic treatment is indicated for blood  culture contaminants. CRITICAL RESULT CALLED TO, READ BACK BY AND VERIFIED WITH: JASON ROBBINS AT 1808 12/12/2016 BY TFK.    Staphylococcus aureus NOT DETECTED NOT DETECTED Final   Methicillin resistance DETECTED (A) NOT DETECTED Final    Comment: CRITICAL RESULT CALLED TO, READ BACK BY AND VERIFIED WITH: JASON ROBBINS AT 0272 12/12/2016 BY TFK.    Streptococcus species NOT DETECTED NOT DETECTED Final   Streptococcus agalactiae NOT DETECTED NOT DETECTED Final   Streptococcus pneumoniae NOT DETECTED NOT DETECTED Final   Streptococcus pyogenes NOT DETECTED NOT DETECTED Final   Acinetobacter baumannii NOT DETECTED NOT DETECTED Final   Enterobacteriaceae species NOT DETECTED NOT DETECTED Final   Enterobacter cloacae complex NOT DETECTED NOT DETECTED Final   Escherichia coli NOT DETECTED NOT DETECTED  Final   Klebsiella oxytoca NOT DETECTED NOT DETECTED Final   Klebsiella pneumoniae NOT DETECTED NOT DETECTED Final   Proteus species NOT DETECTED NOT DETECTED Final   Serratia marcescens NOT DETECTED NOT DETECTED Final   Haemophilus influenzae NOT DETECTED NOT DETECTED Final   Neisseria meningitidis NOT DETECTED NOT DETECTED Final   Pseudomonas aeruginosa NOT DETECTED NOT DETECTED Final   Candida albicans NOT DETECTED NOT DETECTED Final   Candida glabrata NOT DETECTED NOT DETECTED Final   Candida krusei NOT DETECTED NOT DETECTED Final   Candida parapsilosis NOT DETECTED NOT DETECTED Final   Candida tropicalis NOT DETECTED NOT DETECTED Final  MRSA PCR Screening     Status: None   Collection Time: 12/12/16  2:17 AM  Result Value Ref Range Status   MRSA by PCR NEGATIVE NEGATIVE Final    Comment:        The GeneXpert MRSA Assay (FDA approved for NASAL specimens only), is one component of a comprehensive MRSA colonization surveillance program. It is not intended to diagnose MRSA infection nor to guide or monitor treatment for MRSA infections.     Radiology Reports Dg Abd 1 View  Result Date: 12/12/2016 CLINICAL DATA:  NG tube placement EXAM: ABDOMEN - 1 VIEW COMPARISON:  None. FINDINGS: Enteric tube tip is in the left upper quadrant consistent with location in the upper stomach. Emphysematous changes suggested in the lung bases. IMPRESSION: Enteric  tube tip is in the left upper quadrant consistent with location in the upper stomach. Electronically Signed   By: Lucienne Capers M.D.   On: 12/12/2016 03:57   Ct Angio Chest Pe W Or Wo Contrast  Result Date: 12/12/2016 CLINICAL DATA:  Acute onset altered mental status. Confusion. Hypoxia. Initial encounter. EXAM: CT ANGIOGRAPHY CHEST WITH CONTRAST TECHNIQUE: Multidetector CT imaging of the chest was performed using the standard protocol during bolus administration of intravenous contrast. Multiplanar CT image reconstructions and MIPs were  obtained to evaluate the vascular anatomy. CONTRAST:  75 mL of Isovue 370 IV contrast COMPARISON:  Chest radiograph performed earlier today at 12:11 a.m., and CTA of the chest performed 08/25/2015 FINDINGS: Cardiovascular:  There is no evidence of pulmonary embolus. The heart is normal in size. The thoracic aorta is grossly unremarkable. The great vessels are within normal limits. Mediastinum/Nodes: The mediastinum is unremarkable in appearance. No mediastinal lymphadenopathy is seen. No pericardial effusion is identified. The patient's endotracheal tube is seen ending 3 cm above the carina. The thyroid gland is unremarkable. No axillary lymphadenopathy is appreciated. Lungs/Pleura: Bilateral emphysema is noted. Minimal left basilar atelectasis or scarring is noted. No pleural effusion or pneumothorax is seen. No masses are identified. Upper Abdomen: The visualized portions of the liver and spleen are grossly unremarkable. Musculoskeletal: No acute osseous abnormalities are identified. The visualized musculature is unremarkable in appearance. There is suggestion of chronic disruption of the patient left breast implant. Review of the MIP images confirms the above findings. IMPRESSION: 1. No evidence of pulmonary embolus. 2. Bilateral emphysema noted. Minimal left basilar atelectasis or scarring noted. Electronically Signed   By: Garald Balding M.D.   On: 12/12/2016 02:20   Dg Chest Port 1 View  Result Date: 12/15/2016 CLINICAL DATA:  Acute on chronic respiratory failure. EXAM: PORTABLE CHEST 1 VIEW COMPARISON:  Radiographs of Dec 13, 2016. FINDINGS: The heart size and mediastinal contours are within normal limits. Endotracheal and nasogastric tubes are unchanged in position. No pneumothorax or pleural effusion is noted. Emphysematous disease is noted in the upper lobes bilaterally. Hyperexpansion of the lungs is noted. Both lungs are clear. The visualized skeletal structures are unremarkable. IMPRESSION: Findings  consistent with chronic obstructive pulmonary disease. Stable support apparatus. No acute abnormality seen. Electronically Signed   By: Marijo Conception, M.D.   On: 12/15/2016 08:55   Dg Chest Port 1 View  Result Date: 12/13/2016 CLINICAL DATA:  Respiratory failure. EXAM: PORTABLE CHEST 1 VIEW COMPARISON:  CT 12/12/2016. Chest x-ray 12/12/2016, 12/11/2016, 04/17/2016 FINDINGS: Endotracheal tube, NG tube in stable position. Heart size normal. Increased density noted over the lungs most likely related to overlying breast implants. Mild bilateral pleural-parenchymal thickening noted most likely related scarring. COPD . No pleural effusion or pneumothorax. Prior cervical spine fusion . IMPRESSION: 1.  Lines and tubes in stable position. 2. Pleuroparenchymal thickening consistent scarring. COPD. Similar findings noted on prior exam . No acute abnormality. Electronically Signed   By: [name]  Register   On: 12/13/2016 07:06   Dg Chest Portable 1 View  Result Date: 12/12/2016 CLINICAL DATA:  Post intubation EXAM: PORTABLE CHEST 1 VIEW COMPARISON:  12/11/2016 FINDINGS: Interval placement of an endotracheal tube with tip measuring 5.3 cm above the carina. Heart size and pulmonary vascularity are normal. Emphysematous changes in the lungs with scattered fibrosis. No focal consolidation or airspace disease. No blunting of costophrenic angles. No pneumothorax. Postoperative changes in the cervical spine. IMPRESSION: Endotracheal tube tip measures 5.3 cm above the  carina, in good apparent location. Electronically Signed   By: Lucienne Capers M.D.   On: 12/12/2016 00:39   Dg Chest Port 1 View  Result Date: 12/12/2016 CLINICAL DATA:  Altered mental status EXAM: PORTABLE CHEST 1 VIEW COMPARISON:  04/17/2016 FINDINGS: Cervical spine hardware. Hyperinflation with emphysematous disease and bullous change in the right greater than left upper lobes. No acute consolidation or effusion. Stable cardiomediastinal silhouette. No  pneumothorax. IMPRESSION: Hyperinflation with emphysematous disease. No acute infiltrate or edema Electronically Signed   By: Donavan Foil M.D.   On: 12/12/2016 00:11     CBC  Recent Labs Lab 12/11/16 2252 12/12/16 0303 12/13/16 0545  WBC 11.7* 10.2 18.2*  HGB 11.2* 10.2* 8.3*  HCT 35.5 32.8* 26.7*  PLT 215 177 172  MCV 89.0 90.7 88.0  MCH 28.1 28.3 27.2  MCHC 31.5* 31.2* 30.9*  RDW 13.6 13.5 13.9  LYMPHSABS 1.3  --   --   MONOABS 0.7  --   --   EOSABS 0.0  --   --   BASOSABS 0.0  --   --     Chemistries   Recent Labs Lab 12/11/16 2251 12/12/16 0303 12/13/16 0545 12/14/16 0245 12/15/16 0433  NA 144 142 141 142 147*  K 4.6 4.1 4.1 4.5 4.9  CL 92* 97* 104 105 107  CO2 43* 35* 34* 33* 35*  GLUCOSE 178* 250* 164* 118* 141*  BUN 33* 27* 36* 31* 28*  CREATININE 0.68 0.68 0.70 0.51 0.41*  CALCIUM 9.8 8.6* 8.6* 8.7* 8.5*  MG 1.8 2.2 2.0  --   --   AST 58* 64*  --   --   --   ALT 31 32  --   --   --   ALKPHOS 100 86  --   --   --   BILITOT 0.9 0.8  --   --   --    ------------------------------------------------------------------------------------------------------------------ estimated creatinine clearance is 63.9 mL/min (A) (by C-G formula based on SCr of 0.41 mg/dL (L)). ------------------------------------------------------------------------------------------------------------------ No results for input(s): HGBA1C in the last 72 hours. ------------------------------------------------------------------------------------------------------------------ No results for input(s): CHOL, HDL, LDLCALC, TRIG, CHOLHDL, LDLDIRECT in the last 72 hours. ------------------------------------------------------------------------------------------------------------------ No results for input(s): TSH, T4TOTAL, T3FREE, THYROIDAB in the last 72 hours.  Invalid input(s):  FREET3 ------------------------------------------------------------------------------------------------------------------ No results for input(s): VITAMINB12, FOLATE, FERRITIN, TIBC, IRON, RETICCTPCT in the last 72 hours.  Coagulation profile  Recent Labs Lab 12/12/16 0303  INR 0.98    No results for input(s): DDIMER in the last 72 hours.  Cardiac Enzymes No results for input(s): CKMB, TROPONINI, MYOGLOBIN in the last 168 hours.  Invalid input(s): CK ------------------------------------------------------------------------------------------------------------------ Invalid input(s): [place]   This is a 66 y.o. 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 Acute respiratory failure with hypoxia and hypercapnia, in a patient with home-O2 dependent COPD.   CT scan of the chest shows no pulmonary embolism Continue Mechanical ventilation per pulmonary. Wean as tolerated Will be difficult to wean due to her COPD   #. Sepsis, with positive blood cultures due to coag-negative staph continue anabiotic, blood culture showed contamination - Continue IV antibiotics: Merrem,   #. History of fibromyalgia/depression -Hold Cymbalta - Chronic opiate use  #. History of hyperlipidemia - hold Lipitor  #. History of GERD - hold AcipHex  Updated husband on the condition and status  Code Status Orders        Start     Ordered   12/12/16 1202  Do not attempt resuscitation (DNR)  Continuous    Question Answer Comment  In the event of cardiac or respiratory ARREST Do not call a "code blue"   In the event of cardiac or respiratory ARREST Do not perform Intubation, CPR, defibrillation or ACLS   In the event of cardiac or respiratory ARREST Use medication by any route, position, wound care, and other measures to relive pain  and suffering. May use oxygen, suction and manual treatment of airway obstruction as needed for comfort.      12/12/16 1219    Code Status History    Date Active Date Inactive Code Status Order ID Comments User Context   12/12/2016  2:18 AM 12/12/2016 12:19 PM Full Code [id number]  HugelmeyerUbaldo Glassing, DO Inpatient   04/17/2016 11:13 PM 04/21/2016  6:10 PM Full Code [id number]  Holley Raring, NP ED    Advance Directive Documentation     Most Recent Value  Type of Advance Directive  Living will  Pre-existing out of facility DNR order (yellow form or pink MOST form)  -  "MOST" Form in Place?  -           Consults  Intensivists DVT Prophylaxis  Lovenox   Lab Results  Component Value Date   PLT 172 12/13/2016     Time Spent in minutes   35mn spent  Greater than 50% of time spent in care coordination and counseling patient regarding the condition and plan of care.   PDustin FlockM.D on 12/15/2016 at 1:13 PM  Between 7am to 6pm - Pager - [phone]  After 6pm go to [email or web address] - password [email or web address]   Office  [phone]

## 2016-12-15 NOTE — Progress Notes (Addendum)
PULMONARY / CRITICAL CARE MEDICINE   Name: VONETTE GROSSO MRN: [id number] DOB: 01-Oct-1950    ADMISSION DATE:  12/11/2016   CONSULTATION DATE:  12/11/2016  REFERRING MD:  Hugelmelyer  Reason: Acute hypercarbic respiratory failure  CHIEF COMPLAINT:  Unresponsive  HISTORY OF PRESENT ILLNESS:   66 yo female admitted 05/23 with acute on chronic hypercapnic hypoxic respiratory failure secondary to AECOPD, acute encephalopathy, lactic acidosis, and sepsis requiring mechanical intubation  REVIEW OF SYSTEMS:   Unable to obtain as patient is currently intubated  SUBJECTIVE:  Patient requiring increased sedation for ventilator patient synchrony. No success at weaning over the weekend  VITAL SIGNS: BP (!) 134/99   Pulse 70   Temp 98.1 F (36.7 C)   Resp 15   Ht 5\' 6"  (1.676 m)   Wt 59.9 kg (132 lb 0.9 oz)   SpO2 95%   BMI 21.31 kg/m   HEMODYNAMICS:    VENTILATOR SETTINGS: Vent Mode: PRVC FiO2 (%):  [28 %] 28 % Set Rate:  [15 bmp] 15 bmp Vt Set:  [450 mL] 450 mL PEEP:  [5 cmH20] 5 cmH20  INTAKE / OUTPUT: I/O last 3 completed shifts: In: 38 [I.V.:1938; NG/GT:2270; IV Piggyback:500] Out: 3640 [Urine:3465; Emesis/NG output:175]  PHYSICAL EXAMINATION: General: acutely ill appearing Caucasian female, mechanically intubated  Neuro: sedated, moves upper and lower extremities, withdraws from painful stimulation, PERRL HEENT: trachea midline, no JVD Cardiovascular: nsr, s1s2, no M/R/G Lungs: Diminished breath sounds throughout, no wheezes or rhonchi, even non labored  Abdomen: Soft, nontender, nondistended, +BS x4 Musculoskeletal:  No joint deformities, no edema Skin: Warm and dry  LABS:  BMET  Recent Labs Lab 12/13/16 0545 12/14/16 0245 12/15/16 0433  NA 141 142 147*  K 4.1 4.5 4.9  CL 104 105 107  CO2 34* 33* 35*  BUN 36* 31* 28*  CREATININE 0.70 0.51 0.41*  GLUCOSE 164* 118* 141*    Electrolytes  Recent Labs Lab 12/11/16 2251  12/12/16 0303  12/13/16 0545 12/14/16 0245 12/15/16 0433  CALCIUM 9.8  --  8.6* 8.6* 8.7* 8.5*  MG 1.8  --  2.2 2.0  --   --   PHOS  --   < > 2.1* 1.1* 2.4* 2.1*  < > = values in this interval not displayed.  CBC  Recent Labs Lab 12/11/16 2252 12/12/16 0303 12/13/16 0545  WBC 11.7* 10.2 18.2*  HGB 11.2* 10.2* 8.3*  HCT 35.5 32.8* 26.7*  PLT 215 177 172    Coag's  Recent Labs Lab 12/12/16 0303  APTT <24*  INR 0.98    Sepsis Markers  Recent Labs Lab 12/12/16 0303 12/12/16 0705 12/12/16 2126 12/13/16 0545 12/14/16 0245  LATICACIDVEN 5.1* 6.2* 1.1 1.1  --   PROCALCITON <0.10  --   --  <0.10 <0.10    ABG  Recent Labs Lab 12/12/16 0105 12/12/16 0458  PHART 7.00* 7.40  PCO2ART 120* 55*  PO2ART 156* 263*    Liver Enzymes  Recent Labs Lab 12/11/16 2251 12/12/16 0303  AST 58* 64*  ALT 31 32  ALKPHOS 100 86  BILITOT 0.9 0.8  ALBUMIN 3.9 3.2*    Cardiac Enzymes No results for input(s): TROPONINI, PROBNP in the last 168 hours.  Glucose  Recent Labs Lab 12/14/16 1145 12/14/16 1706 12/14/16 1924 12/14/16 1947 12/14/16 2353 12/15/16 0401  GLUCAP 123* 129* 165* 144* 136* 152*    Imaging No results found.  Assessment:   Ventilator dependent respiratory failure. No significant improvement, patient still requiring propofol and  fentanyl for sedation. On Solu-Medrol, albuterol/Atrovent. Will obtain morning chest x-ray and arterial blood gas  Sepsis. Patient is on meropenem alone, white count has been increasing now to 18,000. We will obtain blood cultures respiratory cultures and add back vancomycin  Worsening leukocytosis  Renal insufficiency. Slight improvement in BUN sickly stable   Hypernatremia. Sodium creeping up to 147 will add D5W and recheck  Hyperglycemia. On coverage  Hypophosphatemia. Replacement  Ongoing discussions with family about transitioning to comfort/palliative care. Patient has made no progress and was a DO NOT RESUSCITATE prior  to having up reversed.  Critical care time 40 minutes, respiratory failure, septic shock requiring active intervention  Hermelinda Dellen, D.O.

## 2016-12-15 NOTE — Plan of Care (Signed)
Problem: Safety: Goal: Ability to remain free from injury will improve Outcome: Progressing Pt has remained free from injury on my shift.   Problem: Pain Managment: Goal: General experience of comfort will improve Outcome: Progressing Pt pain in controled with Fentanyl drip, and Fentanyl pushes on my shift.  Problem: Skin Integrity: Goal: Risk for impaired skin integrity will decrease Outcome: Progressing Pt remains free from injury on my shift. Pt able to tolerate bath on my shift.

## 2016-12-16 DIAGNOSIS — G9341 Metabolic encephalopathy: Secondary | ICD-10-CM | POA: Diagnosis not present

## 2016-12-16 DIAGNOSIS — J9600 Acute respiratory failure, unspecified whether with hypoxia or hypercapnia: Secondary | ICD-10-CM

## 2016-12-16 DIAGNOSIS — Z515 Encounter for palliative care: Secondary | ICD-10-CM

## 2016-12-16 DIAGNOSIS — J441 Chronic obstructive pulmonary disease with (acute) exacerbation: Secondary | ICD-10-CM | POA: Diagnosis not present

## 2016-12-16 DIAGNOSIS — Z7189 Other specified counseling: Secondary | ICD-10-CM

## 2016-12-16 DIAGNOSIS — A419 Sepsis, unspecified organism: Secondary | ICD-10-CM | POA: Diagnosis not present

## 2016-12-16 DIAGNOSIS — J9602 Acute respiratory failure with hypercapnia: Secondary | ICD-10-CM | POA: Diagnosis not present

## 2016-12-16 DIAGNOSIS — J9601 Acute respiratory failure with hypoxia: Secondary | ICD-10-CM | POA: Diagnosis not present

## 2016-12-16 LAB — BLOOD GAS, ARTERIAL
Acid-Base Excess: 13 mmol/L — ABNORMAL HIGH (ref 0.0–2.0)
BICARBONATE: 40 mmol/L — AB (ref 20.0–28.0)
FIO2: 0.28
MECHVT: 450 mL
O2 Saturation: 93.1 %
PATIENT TEMPERATURE: 37
PEEP/CPAP: 5 cmH2O
PH ART: 7.39 (ref 7.350–7.450)
RATE: 15 resp/min
pCO2 arterial: 66 mmHg (ref 32.0–48.0)
pO2, Arterial: 68 mmHg — ABNORMAL LOW (ref 83.0–108.0)

## 2016-12-16 LAB — GLUCOSE, CAPILLARY
GLUCOSE-CAPILLARY: 128 mg/dL — AB (ref 65–99)
GLUCOSE-CAPILLARY: 146 mg/dL — AB (ref 65–99)
GLUCOSE-CAPILLARY: 153 mg/dL — AB (ref 65–99)
GLUCOSE-CAPILLARY: 87 mg/dL (ref 65–99)
Glucose-Capillary: 137 mg/dL — ABNORMAL HIGH (ref 65–99)

## 2016-12-16 LAB — BASIC METABOLIC PANEL
ANION GAP: 7 (ref 5–15)
BUN: 25 mg/dL — ABNORMAL HIGH (ref 6–20)
CALCIUM: 8.7 mg/dL — AB (ref 8.9–10.3)
CO2: 34 mmol/L — ABNORMAL HIGH (ref 22–32)
CREATININE: 0.43 mg/dL — AB (ref 0.44–1.00)
Chloride: 100 mmol/L — ABNORMAL LOW (ref 101–111)
GLUCOSE: 146 mg/dL — AB (ref 65–99)
Potassium: 5.1 mmol/L (ref 3.5–5.1)
Sodium: 141 mmol/L (ref 135–145)

## 2016-12-16 LAB — CULTURE, BLOOD (ROUTINE X 2): Culture: NO GROWTH

## 2016-12-16 LAB — VANCOMYCIN, TROUGH: VANCOMYCIN TR: 17 ug/mL (ref 15–20)

## 2016-12-16 LAB — PHOSPHORUS: Phosphorus: 3.2 mg/dL (ref 2.5–4.6)

## 2016-12-16 NOTE — Progress Notes (Signed)
Daily Progress Note   Patient Name: Tiffany Mclean       Date: 12/16/2016 DOB: Jan 08, 1951  Age: 66 y.o. MRN#: [id number] Attending Physician: Dustin Flock, MD Primary Care Physician: Birdie Sons, MD Admit Date: 12/11/2016  Reason for Consultation/Follow-up: Establishing goals of care, Extended family meeting  Subjective: Patient non-responsive on high levels of benzodiazepines and opiates.  Now on pressors.  Has had agitation over the weekend per notes and RN.  Spoke with husband at bedside.  He requests an extended family meeting with his daughter, 1 of 2 sons, and patient's 2 sisters.  He states she sat up over the weekend and looked at him very angrily.  He is confident of her feelings - she would not have wanted to be intubated again.  At 2 pm we met with the patients two sisters (Patsy and Venida Jarvis), her dtr Raquel Sarna), her son [name]) on the phone, and her husband [name].    [name] was under the impression that if she was extubated she would likely breathe on her own.  We discussed the breathing trials that she has failed each day and it was made clear that she would be very unlikely to survive extubation.  [name] had a hospice brochure - I clearly explained to [name] that Tiffany Mclean would most likely not leave the hospital.  The family members understood and each of them stated that Ann's comfort was the highest priority - she does not need to suffer or panic.  [name] and Truddie Crumble. Expressed concern about the other son, [name].  There was concern that [name] would blame himself for his mother's death.  There were inferences that [name] may cause a scene or consider taking his own life.   Truddie Crumble. And [name] will drive up from [facility] tomorrow to see their mother.  The family will meet with PMT again  and move forward with extubation without re-intubation and full comfort measures.   Assessment: Patient still intubated.  Failed weaning attempts. Now requiring pressors.  Blood cultures are positive for aerococcus species.  No improvement over the weekend.  On my visit her eyes are open but she is not seeing - she is flushed and agitated.   Patient Profile/HPI: 66 y.o. female  with past medical history of COPD on home oxygen, anxiety, hx of vaginal cancer,  bedbound for the last 2+ years who was admitted on 12/11/2016 with altered mental status and acute on chronic hypoxic respiratory failure. She was intubated and is being treated in the ICU.  Blood cultures are positive for aerococcus bacteremia.  She failed the spontaneous breathing trial today due to agitation, tachycardia and hypoxemia.  Of note, Mrs. Heatherly, "Tiffany Mclean" has completed Advanced Directives 2x in the past 2 years.  She was adamant that she did not want to be intubated or have ACLS.  Her paperwork is in EPIC  Length of Stay: 4  Current Medications: Scheduled Meds:  . budesonide (PULMICORT) nebulizer solution  0.25 mg Nebulization Q6H  . chlorhexidine gluconate (MEDLINE KIT)  15 mL Mouth Rinse BID  . enoxaparin (LOVENOX) injection  40 mg Subcutaneous Q24H  . free water  200 mL Per Tube Q8H  . insulin aspart  0-15 Units Subcutaneous Q4H  . ipratropium-albuterol  3 mL Nebulization Q6H  . mouth rinse  15 mL Mouth Rinse QID  . methylPREDNISolone (SOLU-MEDROL) injection  40 mg Intravenous Q12H  . pantoprazole (PROTONIX) IV  40 mg Intravenous Q24H    Continuous Infusions: . dextrose 50 mL/hr at 12/16/16 0630  . feeding supplement (VITAL AF 1.2 CAL) 1,000 mL (12/16/16 0630)  . fentaNYL infusion INTRAVENOUS 300 mcg/hr (12/16/16 0630)  . meropenem (MERREM) IV 1 g (12/16/16 0615)  . midazolam (VERSED) infusion 2.5 mg/hr (12/16/16 0630)  . norepinephrine (LEVOPHED) Adult infusion 2 mcg/min (12/16/16 0630)  . propofol (DIPRIVAN)  infusion 16.138 mcg/kg/min (12/16/16 0630)  . vancomycin Stopped (12/15/16 2109)    PRN Meds: acetaminophen **OR** acetaminophen, albuterol, bisacodyl, bisacodyl, fentaNYL (SUBLIMAZE) injection, magnesium citrate, midazolam, [DISCONTINUED] ondansetron **OR** ondansetron (ZOFRAN) IV, senna-docusate, sennosides  Physical Exam        Well developed, appears older than stated age, non-responsive, sedated, intubated. CV rrr,  Resp  Agitated and in distress during a breathing trial on the vent. Abdomen soft.  Vital Signs: BP 118/64   Pulse 60   Temp 98.2 F (36.8 C)   Resp 15   Ht 5' 6" (1.676 m)   Wt 59.9 kg (132 lb 0.9 oz)   SpO2 97%   BMI 21.31 kg/m  SpO2: SpO2: 97 % O2 Device: O2 Device: Ventilator O2 Flow Rate: O2 Flow Rate (L/min): 5 L/min  Intake/output summary:  Intake/Output Summary (Last 24 hours) at 12/16/16 0843 Last data filed at 12/16/16 0800  Gross per 24 hour  Intake          4341.19 ml  Output             1450 ml  Net          2891.19 ml   LBM: Last BM Date: 12/11/16 Baseline Weight: Weight: 59.9 kg (132 lb) Most recent weight: Weight: 59.9 kg (132 lb 0.9 oz)       Palliative Assessment/Data:    Flowsheet Rows     Most Recent Value  Intake Tab  Referral Department  Critical care  Unit at Time of Referral  ICU  Palliative Care Primary Diagnosis  Pulmonary  Date Notified  12/12/16  Palliative Care Type  New Palliative care  Reason for referral  Clarify Goals of Care, Counsel Regarding Hospice, Non-pain Symptom  Date of Admission  12/12/16  Date first seen by Palliative Care  12/13/16  # of days Palliative referral response time  1 Day(s)  # of days IP prior to Palliative referral  0  Clinical Assessment  Palliative Performance Scale Score  20%  Anxiety Max Last 24 Hours  8  Anxiety Min Last 24 Hours  4  Psychosocial & Spiritual Assessment  Palliative Care Outcomes  Patient/Family meeting held?  Yes  Who was at the meeting?  patient and husband  [name]  Palliative Care Outcomes  Improved non-pain symptom therapy, Clarified goals of care, Counseled regarding hospice      Patient Active Problem List   Diagnosis Date Noted  . Cancer ([place])   . COPD (chronic obstructive pulmonary disease) ([place])   . Diabetes mellitus without complication ([place])   . Severe sepsis ([place])   . MRSA bacteremia   . Acute respiratory failure with hypoxia and hypercapnia ([place]) 12/12/2016  . Dysphagia 04/21/2016  . COPD exacerbation ([place]) 04/17/2016  . Tremor 06/13/2015  . Depression 05/15/2015  . Anxiety 02/13/2015  . Dermatitis, eczematoid 02/13/2015  . Hypokalemia 02/13/2015  . Lower extremity weakness 02/13/2015  . Leukocytosis 02/13/2015  . Magnesium deficiency 02/13/2015  . Symptomatic menopausal or female climacteric states 02/13/2015  . Panic disorder 02/13/2015  . Bed sore on buttock 02/08/2015  . COPD with emphysema ([place]) 11/28/2014  . Chronic respiratory failure with hypoxia ([place]) 11/28/2014  . GERD (gastroesophageal reflux disease) 11/28/2014  . Anemia of chronic disease 11/28/2014  . Pneumonia 11/23/2014  . Generalized anxiety disorder 11/23/2014  . Injury of kidney 10/31/2014  . Personal history of perinatal problems 10/24/2014  . Chronic pain associated with significant psychosocial dysfunction 02/21/2010  . Anemia, iron deficiency 01/29/2010  . Allergic rhinitis 12/01/2009  . DDD (degenerative disc disease), cervical 12/01/2009  . Major depression 06/30/2009  . Abdominal pain, generalized 03/21/2009  . Vitamin D deficiency 07/22/2008  . Hypercholesterolemia without hypertriglyceridemia 03/11/2007  . Essential and other specified forms of tremor 07/22/1998    Palliative Care Plan    Recommendations/Plan:  Two sons [name] and Truddie Crumble. to come from [place] to see their mother.  PMT will meet with family.  To arrange a time to move forward with extubation and full comfort.  No tracheostomy.  Goals of Care and Additional  Recommendations:  Limitations on Scope of Treatment: No Tracheostomy, Do not escalate care.  Family understands that she is at high risk of an acute event even on life support.  Code Status:  DNR  Prognosis:   Hours - Days   Discharge Planning:  Anticipated Hospital Death  Care plan was discussed with bedside RN, husband and extended family  Thank you for allowing the Palliative Medicine Team to assist in the care of this patient.  Total time spent:  35 min.     Greater than 50%  of this time was spent counseling and coordinating care related to the above assessment and plan.  Imogene Burn, PA-C Palliative Medicine  Please contact Palliative MedicineTeam phone at [phone] for questions and concerns between 7 am - 7 pm.   Please see AMION for individual provider pager numbers.

## 2016-12-16 NOTE — Progress Notes (Signed)
Patient BP and MAP below parameters Hinton Dyer NP made aware NS boluses ordered and complete, sedation titrated with little change. Propofol paused patient alert but agitated BP and MAP improved. Propofol restarted d/t agitation and biting ETT. Attempts to keep propofol at a lower rate unsuccessful. Patient sinus brady in the low 50's at this time Hinton Dyer NP made aware. Levophed infusing as BP and MAP started trending down again. Will attempt to slowly titrate propofol while keeping patient calm. Husband at bedside throughout events explained BP and MAP goals as well as effects of medication on vital signs. Husband agreed with starting levophed prior to medication being started.

## 2016-12-16 NOTE — Progress Notes (Signed)
Pharmacy Antibiotic Note/Electrolyte Monitoring  Tiffany Mclean is a 66 y.o. female with a h/o oxygen-dependient COPD admitted on 12/11/2016 with acute respiratory failure.  Pharmacy has been consulted for meropenem dosing as well as electrolyte monitoring and replacement.   Plan: 1. ABX:   5/27: Restart Vancomycin per MD: WBC increased on 5/25, temp 99.5 , no significant improvement. Reculture. Will continue Vancomycin 1 gram IV q12h.  Will check trough prior to 4th dose on 5/28  -Continue meropenem 1 g iv q 8 hours.   2.Electrolytes:  K 5.1, Na , Phos 3.2.  Will recheck electrolytes with am labs.    Height: 5\' 6"  (167.6 cm) Weight: 132 lb 0.9 oz (59.9 kg) IBW/kg (Calculated) : 59.3  Temp (24hrs), Avg:98.3 F (36.8 C), Min:97 F (36.1 C), Max:100 F (37.8 C)   Recent Labs Lab 12/11/16 2252 12/12/16 0303 12/12/16 0705 12/12/16 2126 12/13/16 0545 12/14/16 0245 12/15/16 0433 12/16/16 0429  WBC 11.7* 10.2  --   --  18.2*  --   --   --   CREATININE  --  0.68  --   --  0.70 0.51 0.41* 0.43*  LATICACIDVEN 3.5* 5.1* 6.2* 1.1 1.1  --   --   --     Estimated Creatinine Clearance: 63.9 mL/min (A) (by C-G formula based on SCr of 0.43 mg/dL (L)).    BMP Latest Ref Rng & Units 12/16/2016 12/15/2016 12/14/2016  Glucose 65 - 99 mg/dL 146(H) 141(H) 118(H)  BUN 6 - 20 mg/dL 25(H) 28(H) 31(H)  Creatinine 0.44 - 1.00 mg/dL 0.43(L) 0.41(L) 0.51  BUN/Creat Ratio 12 - 28 - - -  Sodium 135 - 145 mmol/L 141 147(H) 142  Potassium 3.5 - 5.1 mmol/L 5.1 4.9 4.5  Chloride 101 - 111 mmol/L 100(L) 107 105  CO2 22 - 32 mmol/L 34(H) 35(H) 33(H)  Calcium 8.9 - 10.3 mg/dL 8.7(L) 8.5(L) 8.7(L)   Magnesium (mg/dL)  Date Value  12/13/2016 2.0  11/18/2014 1.6 (L)   Phosphorus (mg/dL)  Date Value  12/16/2016 3.2  11/18/2014 2.5   Albumin (g/dL)  Date Value  12/12/2016 3.2 (L)  06/21/2016 3.8  11/10/2014 2.9 (L)     Allergies  Allergen Reactions  . Asa [Aspirin]   . Avelox  [Moxifloxacin]   . Cephalexin   . Codeine   . Erythromycin Ethylsuccinate   . Flagyl [Metronidazole]   . Latex   . Levaquin [Levofloxacin] Hives  . Meloxicam   . Shellfish Allergy Nausea And Vomiting and Other (See Comments)    Dizzy, fainting  . Sulfa Antibiotics   . Topiramate   . Doxycycline Rash  . Tetracyclines & Related Rash    Antimicrobials this admission: meropenem 5/24 >>  vancomycin 5/24 >> 5/25 Vancomycin 5/27 >>  Dose adjustments this admission:   Microbiology results: 5/23 BCx: CNS / diptheroids 2/4 5/23 UCx: NG  Sputum: sent  5/24 MRSA PCR: negative  Thank you for allowing pharmacy to be a part of this patient's care.  [name] 12/16/2016 7:32 AM

## 2016-12-16 NOTE — Progress Notes (Signed)
PULMONARY / CRITICAL CARE MEDICINE   Name: Tiffany Mclean MRN: [id number] DOB: 05-10-1951    ADMISSION DATE:  12/11/2016    Synopsis:   66 yo female admitted 05/23 with acute on chronic hypercapnic hypoxic respiratory failure secondary to AECOPD, acute encephalopathy, lactic acidosis, and ?sepsis requiring mechanical intubation  REVIEW OF SYSTEMS:   Unable to obtain as patient is currently intubated  SUBJECTIVE:  Pt remain intubated; became anxious/agitated with weaning of sedation this am.   VITAL SIGNS: BP (!) 92/55   Pulse 62   Temp 98.1 F (36.7 C)   Resp 15   Ht 5\' 6"  (1.676 m)   Wt 132 lb 0.9 oz (59.9 kg)   SpO2 97%   BMI 21.31 kg/m   HEMODYNAMICS:    VENTILATOR SETTINGS: Vent Mode: PRVC FiO2 (%):  [28 %] 28 % Set Rate:  [15 bmp] 15 bmp Vt Set:  [450 mL] 450 mL PEEP:  [5 cmH20] 5 cmH20  INTAKE / OUTPUT: I/O last 3 completed shifts: In: 7085 [I.V.:3214; Other:10; NG/GT:3061.1; IV Piggyback:800] Out: 2790 [Urine:2615; Emesis/NG output:175]  PHYSICAL EXAMINATION: General: acutely ill appearing Caucasian female, NAD mechanically intubated  Neuro: sedated, moves upper and lower extremities, withdraws from painful stimulation, PERRL HEENT: trachea midline, no JVD Cardiovascular: nsr, s1s2, no M/R/G Lungs: Diminished breath sounds throughout, no wheezes or rhonchi, even non labored  Abdomen: Soft, nontender, nondistended, +BS x4 Musculoskeletal:  No joint deformities, no edema Skin: Warm and dry  LABS:  BMET  Recent Labs Lab 12/14/16 0245 12/15/16 0433 12/16/16 0429  NA 142 147* 141  K 4.5 4.9 5.1  CL 105 107 100*  CO2 33* 35* 34*  BUN 31* 28* 25*  CREATININE 0.51 0.41* 0.43*  GLUCOSE 118* 141* 146*    Electrolytes  Recent Labs Lab 12/11/16 2251  12/12/16 0303 12/13/16 0545 12/14/16 0245 12/15/16 0433 12/16/16 0429  CALCIUM 9.8  --  8.6* 8.6* 8.7* 8.5* 8.7*  MG 1.8  --  2.2 2.0  --   --   --   PHOS  --   < > 2.1* 1.1* 2.4* 2.1*  3.2  < > = values in this interval not displayed.  CBC  Recent Labs Lab 12/11/16 2252 12/12/16 0303 12/13/16 0545  WBC 11.7* 10.2 18.2*  HGB 11.2* 10.2* 8.3*  HCT 35.5 32.8* 26.7*  PLT 215 177 172    Coag's  Recent Labs Lab 12/12/16 0303  APTT <24*  INR 0.98    Sepsis Markers  Recent Labs Lab 12/12/16 0303 12/12/16 0705 12/12/16 2126 12/13/16 0545 12/14/16 0245  LATICACIDVEN 5.1* 6.2* 1.1 1.1  --   PROCALCITON <0.10  --   --  <0.10 <0.10    ABG  Recent Labs Lab 12/12/16 0105 12/12/16 0458 12/15/16 0830  PHART 7.00* 7.40 7.39  PCO2ART 120* 55* 66*  PO2ART 156* 263* 68*    Liver Enzymes  Recent Labs Lab 12/11/16 2251 12/12/16 0303  AST 58* 64*  ALT 31 32  ALKPHOS 100 86  BILITOT 0.9 0.8  ALBUMIN 3.9 3.2*    Cardiac Enzymes No results for input(s): TROPONINI, PROBNP in the last 168 hours.  Glucose  Recent Labs Lab 12/15/16 1606 12/15/16 1956 12/16/16 0037 12/16/16 0414 12/16/16 0718 12/16/16 1128  GLUCAP 133* 128* 87 128* 137* 146*    Imaging No results found.  STUDIES:  CT Angio Chest 05/24>>No evidence of pulmonary embolus. Bilateral emphysema noted. Minimal left basilar atelectasis or scarring noted.  CULTURES: Blood cultures 2 05/23>>negative x1  bottle, staph species x1 bottle likely contaminate  Urine culture 05/23>>negative  Sputum>>  ANTIBIOTICS: Vancomycin 05/241 Meropenem 05/24 >>   SIGNIFICANT EVENTS: 12/12/16: Admitted with acute hypercarbic respiratory failure mechanically intubated   LINES/TUBES: Peripheral IVs  DISCUSSION: This is a 66 year old Caucasian female presenting with acute hypercarbic/hypoxic respiratory failure, sepsis, septic shock, acute encephalopathy and lactic acidosis  ASSESSMENT / PLAN:  PULMONARY A: Acute hypercarbic/hypoxic respiratory failure Acute on chronic COPD exacerbation Pulmonary embolism ruled out CXR images personally reviewed; c/w hyperinflation from severe  emphysema.  P:   Full vent support with current settings Maintain O2 sats 88% to 92% Chest x-ray and ABG when necessary Nebulized bronchodilator and steroids VAP Protocol Continue vent settings as below. Unable to wean due to agitation.   Vent Mode: PRVC FiO2 (%):  [28 %] 28 % Set Rate:  [15 bmp] 15 bmp Vt Set:  [450 mL] 450 mL PEEP:  [5 cmH20] 5 cmH20   INFECTIOUS A:   Possible sepsis of unknown etiology although unlikely  P:   Broad-spectrum antibiotics. Follow-up blood and urine cultures Trend pro-calcitonin Trend WBC and monitor fever curve  ENDOCRINE A:   History of type 2 diabetes P:   Blood glucose monitoring with sliding scale insulin coverage  NEUROLOGIC A:   Acute hypercarbic and metabolic encephalopathy P:   RASS goal: -1 to -2 WUA daily  FAMILY  Discussed with husband at bedside. He admits that he wife did not want this level of care, and this is the second time he has rescinded her DNR status. He is starting to feel that it is time to take her off life support and allow her to pass as per her wishes. He wants to discuss this further with palliative care and wants other family members to know her situation as well.   Marda Stalker, M.D.  Pulmonary/Critical Care PCCM Consult Pager [phone] (please enter 7 digits)   12/16/2016   Critical Care Attestation.  I have personally obtained a history, examined the patient, evaluated laboratory and imaging results, formulated the assessment and plan and placed orders. The Patient requires high complexity decision making for assessment and support, frequent evaluation and titration of therapies, application of advanced monitoring technologies and extensive interpretation of multiple databases. The patient has critical illness that could lead imminently to failure of 1 or more organ systems and requires the highest level of physician preparedness to intervene.  Critical Care Time devoted to patient care  services described in this note is 36 minutes and is exclusive of time spent in procedures.

## 2016-12-16 NOTE — Progress Notes (Signed)
[place] [facility]                                                                                                                                                                                  Patient Demographics   Tiffany Mclean, is a 66 y.o. female, DOB - July 28, 1950, YJE:[id number]  Admit date - 12/11/2016   Admitting Physician Harvie Bridge, DO  Outpatient Primary MD for the patient is Fisher, Kirstie Peri, MD   LOS - 4  Subjective: Patient currently sedated and remains on the ventilator.  Review of Systems:   CONSTITUTIONAL: Intubated  Vitals:   Vitals:   12/16/16 0900 12/16/16 1000 12/16/16 1100 12/16/16 1135  BP: (!) 98/52 (!) 93/52 (!) 92/55   Pulse: 65 64 62   Resp: _0 Temp: 98.1 F (36.7 C) 97.9 F (36.6 C) 98.1 F (36.7 C)   TempSrc:      SpO2: 96% 97% 98% 97%  Weight:      Height:        Wt Readings from Last 3 Encounters:  12/12/16 132 lb 0.9 oz (59.9 kg)  05/30/16 132 lb (59.9 kg)  04/21/16 136 lb (61.7 kg)     Intake/Output Summary (Last 24 hours) at 12/16/16 1303 Last data filed at 12/16/16 1100  Gross per 24 hour  Intake          3981.45 ml  Output             1250 ml  Net          2731.45 ml    Physical Exam:   GENERAL: Intubated critically ill HEAD, EYES, EARS, NOSE AND THROAT: Atraumatic, normocephalic. Extraocular muscles are intact. Pupils equal and reactive to light. Sclerae anicteric. No conjunctival injection. No oro-pharyngeal erythema.  NECK: Supple. There is no jugular venous distention. No bruits, no lymphadenopathy, no thyromegaly.  HEART: Regular rate and rhythm,. No murmurs, no rubs, no clicks.  LUNGS: Decreased breath sounds bilaterally on the ventilator ABDOMEN: Soft, flat, nontender, nondistended. Has good bowel sounds. No hepatosplenomegaly appreciated.  EXTREMITIES: No evidence of any cyanosis, clubbing, or peripheral edema.  +2 pedal and radial pulses bilaterally.   NEUROLOGIC: Sedated  SKIN: Moist and warm with no rashes appreciated.  Psych: Sedated LN: No inguinal LN enlargement    Antibiotics   Anti-infectives    Start     Dose/Rate Route Frequency Ordered Stop   12/15/16 0900  vancomycin (VANCOCIN) IVPB 1000 mg/200 mL premix     1,000 mg 200 mL/hr over 60 Minutes Intravenous Every 12 hours 12/15/16 0832     12/13/16 0400  vancomycin (VANCOCIN) IVPB 1000  mg/200 mL premix  Status:  Discontinued     1,000 mg 200 mL/hr over 60 Minutes Intravenous Every 12 hours 12/12/16 1922 12/13/16 1110   12/12/16 2000  vancomycin (VANCOCIN) IVPB 1000 mg/200 mL premix     1,000 mg 200 mL/hr over 60 Minutes Intravenous  Once 12/12/16 1921 12/12/16 2108   12/12/16 1000  vancomycin (VANCOCIN) IVPB 1000 mg/200 mL premix  Status:  Discontinued     1,000 mg 200 mL/hr over 60 Minutes Intravenous Every 12 hours 12/12/16 0330 12/12/16 1219   12/12/16 0115  vancomycin (VANCOCIN) IVPB 1000 mg/200 mL premix     1,000 mg 200 mL/hr over 60 Minutes Intravenous  Once 12/12/16 0110 12/12/16 0336   12/11/16 2338  meropenem (MERREM) 500 MG/50ML IVPB SOLR    Comments:  David, Raquel: cabinet override      12/11/16 2338 12/12/16 1144   12/11/16 2330  meropenem (MERREM) 1 g in sodium chloride 0.9 % 100 mL IVPB     1 g 200 mL/hr over 30 Minutes Intravenous Every 8 hours 12/11/16 2316        Medications   Scheduled Meds: . budesonide (PULMICORT) nebulizer solution  0.25 mg Nebulization Q6H  . chlorhexidine gluconate (MEDLINE KIT)  15 mL Mouth Rinse BID  . enoxaparin (LOVENOX) injection  40 mg Subcutaneous Q24H  . free water  200 mL Per Tube Q8H  . insulin aspart  0-15 Units Subcutaneous Q4H  . ipratropium-albuterol  3 mL Nebulization Q6H  . mouth rinse  15 mL Mouth Rinse QID  . methylPREDNISolone (SOLU-MEDROL) injection  40 mg Intravenous Q12H  . pantoprazole (PROTONIX) IV  40 mg Intravenous Q24H   Continuous Infusions: . dextrose 50 mL/hr at 12/16/16 0630  .  feeding supplement (VITAL AF 1.2 CAL) 1,000 mL (12/16/16 0630)  . fentaNYL infusion INTRAVENOUS 300 mcg/hr (12/16/16 1227)  . meropenem (MERREM) IV 1 g (12/16/16 0615)  . midazolam (VERSED) infusion 2.5 mg/hr (12/16/16 0630)  . norepinephrine (LEVOPHED) Adult infusion 2 mcg/min (12/16/16 0630)  . propofol (DIPRIVAN) infusion 16.138 mcg/kg/min (12/16/16 0630)  . vancomycin Stopped (12/16/16 1033)   PRN Meds:.acetaminophen **OR** acetaminophen, albuterol, bisacodyl, bisacodyl, fentaNYL (SUBLIMAZE) injection, magnesium citrate, midazolam, [DISCONTINUED] ondansetron **OR** ondansetron (ZOFRAN) IV, senna-docusate, sennosides   Data Review:   Micro Results Recent Results (from the past 240 hour(s))  Blood Culture (routine x 2)     Status: Abnormal   Collection Time: 12/11/16 10:52 PM  Result Value Ref Range Status   Specimen Description BLOOD RT HAND  Final   Special Requests   Final    BOTTLES DRAWN AEROBIC AND ANAEROBIC Blood Culture adequate volume   Culture  Setup Time   Final    GRAM POSITIVE COCCI GRAM POSITIVE RODS IN BOTH AEROBIC AND ANAEROBIC BOTTLES CRITICAL RESULT CALLED TO, READ BACK BY AND VERIFIED WITH: JASON ROBBINS AT 6962 12/12/2016 BY TFK. JASON ROBBINS AT 2120 ON 12/12/2016 JJB    Culture (A)  Final    STAPHYLOCOCCUS SPECIES (COAGULASE NEGATIVE) THE SIGNIFICANCE OF ISOLATING THIS ORGANISM FROM A SINGLE SET OF BLOOD CULTURES WHEN MULTIPLE SETS ARE DRAWN IS UNCERTAIN. PLEASE NOTIFY THE MICROBIOLOGY DEPARTMENT WITHIN ONE WEEK IF SPECIATION AND SENSITIVITIES ARE REQUIRED. DIPHTHEROIDS(CORYNEBACTERIUM SPECIES) Standardized susceptibility testing for this organism is not available. Performed at [facility], [place] [address] [place], [place] [address]    Report Status 12/14/2016 FINAL  Final  Blood Culture (routine x 2)     Status: None   Collection Time: 12/11/16 10:52 PM  Result Value Ref Range Status   Specimen Description BLOOD L FA  Final   Special Requests  BOTTLES DRAWN AEROBIC AND ANAEROBIC [place]  Final   Culture NO GROWTH 5 DAYS  Final   Report Status 12/16/2016 FINAL  Final  Urine culture     Status: None   Collection Time: 12/11/16 10:52 PM  Result Value Ref Range Status   Specimen Description URINE, RANDOM  Final   Special Requests NONE  Final   Culture   Final    NO GROWTH Performed at [facility], [place] [address] [place], [place] [address]    Report Status 12/13/2016 FINAL  Final  Blood Culture ID Panel (Reflexed)     Status: Abnormal   Collection Time: 12/11/16 10:52 PM  Result Value Ref Range Status   Enterococcus species NOT DETECTED NOT DETECTED Final   Listeria monocytogenes NOT DETECTED NOT DETECTED Final   Staphylococcus species DETECTED (A) NOT DETECTED Final    Comment: Methicillin (oxacillin) resistant coagulase negative staphylococcus. Possible blood culture contaminant (unless isolated from more than one blood culture draw or clinical case suggests pathogenicity). No antibiotic treatment is indicated for blood  culture contaminants. CRITICAL RESULT CALLED TO, READ BACK BY AND VERIFIED WITH: JASON ROBBINS AT 1808 12/12/2016 BY TFK.    Staphylococcus aureus NOT DETECTED NOT DETECTED Final   Methicillin resistance DETECTED (A) NOT DETECTED Final    Comment: CRITICAL RESULT CALLED TO, READ BACK BY AND VERIFIED WITH: JASON ROBBINS AT 4287 12/12/2016 BY TFK.    Streptococcus species NOT DETECTED NOT DETECTED Final   Streptococcus agalactiae NOT DETECTED NOT DETECTED Final   Streptococcus pneumoniae NOT DETECTED NOT DETECTED Final   Streptococcus pyogenes NOT DETECTED NOT DETECTED Final   Acinetobacter baumannii NOT DETECTED NOT DETECTED Final   Enterobacteriaceae species NOT DETECTED NOT DETECTED Final   Enterobacter cloacae complex NOT DETECTED NOT DETECTED Final   Escherichia coli NOT DETECTED NOT DETECTED Final   Klebsiella oxytoca NOT DETECTED NOT DETECTED Final   Klebsiella pneumoniae NOT DETECTED NOT  DETECTED Final   Proteus species NOT DETECTED NOT DETECTED Final   Serratia marcescens NOT DETECTED NOT DETECTED Final   Haemophilus influenzae NOT DETECTED NOT DETECTED Final   Neisseria meningitidis NOT DETECTED NOT DETECTED Final   Pseudomonas aeruginosa NOT DETECTED NOT DETECTED Final   Candida albicans NOT DETECTED NOT DETECTED Final   Candida glabrata NOT DETECTED NOT DETECTED Final   Candida krusei NOT DETECTED NOT DETECTED Final   Candida parapsilosis NOT DETECTED NOT DETECTED Final   Candida tropicalis NOT DETECTED NOT DETECTED Final  MRSA PCR Screening     Status: None   Collection Time: 12/12/16  2:17 AM  Result Value Ref Range Status   MRSA by PCR NEGATIVE NEGATIVE Final    Comment:        The GeneXpert MRSA Assay (FDA approved for NASAL specimens only), is one component of a comprehensive MRSA colonization surveillance program. It is not intended to diagnose MRSA infection nor to guide or monitor treatment for MRSA infections.   CULTURE, BLOOD (ROUTINE X 2) w Reflex to ID Panel     Status: None (Preliminary result)   Collection Time: 12/15/16  8:19 AM  Result Value Ref Range Status   Specimen Description BLOOD RIGHT WRIST  Final   Special Requests   Final    BOTTLES DRAWN AEROBIC AND ANAEROBIC Blood Culture adequate volume   Culture NO GROWTH < 24 HOURS  Final   Report Status  PENDING  Incomplete  CULTURE, BLOOD (ROUTINE X 2) w Reflex to ID Panel     Status: Abnormal (Preliminary result)   Collection Time: 12/15/16  8:39 AM  Result Value Ref Range Status   Specimen Description BLOOD LEFT HAND  Final   Special Requests AEROCOCCUS SPECIES Blood Culture adequate volume (A)  Final   Culture NO GROWTH < 24 HOURS  Final   Report Status PENDING  Incomplete  Culture, respiratory (NON-Expectorated)     Status: None (Preliminary result)   Collection Time: 12/15/16  8:40 AM  Result Value Ref Range Status   Specimen Description TRACHEAL ASPIRATE  Final   Special Requests  NONE  Final   Gram Stain   Final    RARE WBC PRESENT,BOTH PMN AND MONONUCLEAR FEW GRAM POSITIVE COCCI    Culture   Final    CULTURE REINCUBATED FOR BETTER GROWTH Performed at [facility], [place] [address] [place], [place] [address]    Report Status PENDING  Incomplete    Radiology Reports Dg Abd 1 View  Result Date: 12/12/2016 CLINICAL DATA:  NG tube placement EXAM: ABDOMEN - 1 VIEW COMPARISON:  None. FINDINGS: Enteric tube tip is in the left upper quadrant consistent with location in the upper stomach. Emphysematous changes suggested in the lung bases. IMPRESSION: Enteric tube tip is in the left upper quadrant consistent with location in the upper stomach. Electronically Signed   By: Lucienne Capers M.D.   On: 12/12/2016 03:57   Ct Angio Chest Pe W Or Wo Contrast  Result Date: 12/12/2016 CLINICAL DATA:  Acute onset altered mental status. Confusion. Hypoxia. Initial encounter. EXAM: CT ANGIOGRAPHY CHEST WITH CONTRAST TECHNIQUE: Multidetector CT imaging of the chest was performed using the standard protocol during bolus administration of intravenous contrast. Multiplanar CT image reconstructions and MIPs were obtained to evaluate the vascular anatomy. CONTRAST:  75 mL of Isovue 370 IV contrast COMPARISON:  Chest radiograph performed earlier today at 12:11 a.m., and CTA of the chest performed 08/25/2015 FINDINGS: Cardiovascular:  There is no evidence of pulmonary embolus. The heart is normal in size. The thoracic aorta is grossly unremarkable. The great vessels are within normal limits. Mediastinum/Nodes: The mediastinum is unremarkable in appearance. No mediastinal lymphadenopathy is seen. No pericardial effusion is identified. The patient's endotracheal tube is seen ending 3 cm above the carina. The thyroid gland is unremarkable. No axillary lymphadenopathy is appreciated. Lungs/Pleura: Bilateral emphysema is noted. Minimal left basilar atelectasis or scarring is noted. No pleural effusion  or pneumothorax is seen. No masses are identified. Upper Abdomen: The visualized portions of the liver and spleen are grossly unremarkable. Musculoskeletal: No acute osseous abnormalities are identified. The visualized musculature is unremarkable in appearance. There is suggestion of chronic disruption of the patient left breast implant. Review of the MIP images confirms the above findings. IMPRESSION: 1. No evidence of pulmonary embolus. 2. Bilateral emphysema noted. Minimal left basilar atelectasis or scarring noted. Electronically Signed   By: Garald Balding M.D.   On: 12/12/2016 02:20   Dg Chest Port 1 View  Result Date: 12/15/2016 CLINICAL DATA:  Acute on chronic respiratory failure. EXAM: PORTABLE CHEST 1 VIEW COMPARISON:  Radiographs of Dec 13, 2016. FINDINGS: The heart size and mediastinal contours are within normal limits. Endotracheal and nasogastric tubes are unchanged in position. No pneumothorax or pleural effusion is noted. Emphysematous disease is noted in the upper lobes bilaterally. Hyperexpansion of the lungs is noted. Both lungs are clear. The visualized skeletal structures are unremarkable.  IMPRESSION: Findings consistent with chronic obstructive pulmonary disease. Stable support apparatus. No acute abnormality seen. Electronically Signed   By: Marijo Conception, M.D.   On: 12/15/2016 08:55   Dg Chest Port 1 View  Result Date: 12/13/2016 CLINICAL DATA:  Respiratory failure. EXAM: PORTABLE CHEST 1 VIEW COMPARISON:  CT 12/12/2016. Chest x-ray 12/12/2016, 12/11/2016, 04/17/2016 FINDINGS: Endotracheal tube, NG tube in stable position. Heart size normal. Increased density noted over the lungs most likely related to overlying breast implants. Mild bilateral pleural-parenchymal thickening noted most likely related scarring. COPD . No pleural effusion or pneumothorax. Prior cervical spine fusion . IMPRESSION: 1.  Lines and tubes in stable position. 2. Pleuroparenchymal thickening consistent  scarring. COPD. Similar findings noted on prior exam . No acute abnormality. Electronically Signed   By: [name]  Register   On: 12/13/2016 07:06   Dg Chest Portable 1 View  Result Date: 12/12/2016 CLINICAL DATA:  Post intubation EXAM: PORTABLE CHEST 1 VIEW COMPARISON:  12/11/2016 FINDINGS: Interval placement of an endotracheal tube with tip measuring 5.3 cm above the carina. Heart size and pulmonary vascularity are normal. Emphysematous changes in the lungs with scattered fibrosis. No focal consolidation or airspace disease. No blunting of costophrenic angles. No pneumothorax. Postoperative changes in the cervical spine. IMPRESSION: Endotracheal tube tip measures 5.3 cm above the carina, in good apparent location. Electronically Signed   By: Lucienne Capers M.D.   On: 12/12/2016 00:39   Dg Chest Port 1 View  Result Date: 12/12/2016 CLINICAL DATA:  Altered mental status EXAM: PORTABLE CHEST 1 VIEW COMPARISON:  04/17/2016 FINDINGS: Cervical spine hardware. Hyperinflation with emphysematous disease and bullous change in the right greater than left upper lobes. No acute consolidation or effusion. Stable cardiomediastinal silhouette. No pneumothorax. IMPRESSION: Hyperinflation with emphysematous disease. No acute infiltrate or edema Electronically Signed   By: Donavan Foil M.D.   On: 12/12/2016 00:11     CBC  Recent Labs Lab 12/11/16 2252 12/12/16 0303 12/13/16 0545  WBC 11.7* 10.2 18.2*  HGB 11.2* 10.2* 8.3*  HCT 35.5 32.8* 26.7*  PLT 215 177 172  MCV 89.0 90.7 88.0  MCH 28.1 28.3 27.2  MCHC 31.5* 31.2* 30.9*  RDW 13.6 13.5 13.9  LYMPHSABS 1.3  --   --   MONOABS 0.7  --   --   EOSABS 0.0  --   --   BASOSABS 0.0  --   --     Chemistries   Recent Labs Lab 12/11/16 2251 12/12/16 0303 12/13/16 0545 12/14/16 0245 12/15/16 0433 12/16/16 0429  NA 144 142 141 142 147* 141  K 4.6 4.1 4.1 4.5 4.9 5.1  CL 92* 97* 104 105 107 100*  CO2 43* 35* 34* 33* 35* 34*  GLUCOSE 178* 250* 164*  118* 141* 146*  BUN 33* 27* 36* 31* 28* 25*  CREATININE 0.68 0.68 0.70 0.51 0.41* 0.43*  CALCIUM 9.8 8.6* 8.6* 8.7* 8.5* 8.7*  MG 1.8 2.2 2.0  --   --   --   AST 58* 64*  --   --   --   --   ALT 31 32  --   --   --   --   ALKPHOS 100 86  --   --   --   --   BILITOT 0.9 0.8  --   --   --   --    ------------------------------------------------------------------------------------------------------------------ estimated creatinine clearance is 63.9 mL/min (A) (by C-G formula based on SCr of 0.43 mg/dL (L)). ------------------------------------------------------------------------------------------------------------------ No  results for input(s): HGBA1C in the last 72 hours. ------------------------------------------------------------------------------------------------------------------ No results for input(s): CHOL, HDL, LDLCALC, TRIG, CHOLHDL, LDLDIRECT in the last 72 hours. ------------------------------------------------------------------------------------------------------------------ No results for input(s): TSH, T4TOTAL, T3FREE, THYROIDAB in the last 72 hours.  Invalid input(s): FREET3 ------------------------------------------------------------------------------------------------------------------ No results for input(s): VITAMINB12, FOLATE, FERRITIN, TIBC, IRON, RETICCTPCT in the last 72 hours.  Coagulation profile  Recent Labs Lab 12/12/16 0303  INR 0.98    No results for input(s): DDIMER in the last 72 hours.  Cardiac Enzymes No results for input(s): CKMB, TROPONINI, MYOGLOBIN in the last 168 hours.  Invalid input(s): CK ------------------------------------------------------------------------------------------------------------------ Invalid input(s): [place]   This is a 66 y.o. 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 Acute respiratory failure with hypoxia and hypercapnia, in a patient with home-O2 dependent COPD.   CT scan of the chest shows no pulmonary embolism Continue Mechanical ventilation per pulmonary. Wean as toleratedAs per pulmonary note husband may be leaning towards terminal weaning palliative care consult has been obtained,     #. Sepsis, with positive blood cultures due to coag-negative staph continue anabiotic, blood culture showed contamination - Continue IV antibiotics: meropenum  #. History of fibromyalgia/depression -Hold Cymbalta - Chronic opiate use  #. History of hyperlipidemia - hold Lipitor  #. History of GERD - hold AcipHex  Updated husband on the condition and status     Code Status Orders        Start     Ordered   12/12/16 1202  Do not attempt resuscitation (DNR)  Continuous    Question Answer Comment  In the event of cardiac or respiratory ARREST Do not call a "code blue"   In the event of cardiac or respiratory ARREST Do not perform Intubation, CPR, defibrillation or ACLS   In the event of cardiac or respiratory ARREST Use medication by any route, position, wound care, and other measures to relive pain and suffering. May use oxygen, suction and manual treatment of airway obstruction as needed for comfort.      12/12/16 1219    Code Status History    Date Active Date Inactive Code Status Order ID Comments User Context   12/12/2016  2:18 AM 12/12/2016 12:19 PM Full Code [id number]  HugelmeyerUbaldo Glassing, DO Inpatient   04/17/2016 11:13 PM 04/21/2016  6:10 PM Full Code [id number]  Holley Raring, NP ED    Advance Directive Documentation     Most Recent Value  Type of Advance Directive  Living will  Pre-existing out of facility DNR order (yellow form or pink MOST form)  -  "MOST" Form in Place?  -           Consults  Intensivists DVT Prophylaxis  Lovenox   Lab Results  Component Value Date   PLT 172 12/13/2016     Time  Spent in minutes 81mn spent  Greater than 50% of time spent in care coordination and counseling patient regarding the condition and plan of care.   PDustin FlockM.D on 12/16/2016 at 1:03 PM  Between 7am to 6pm - Pager - [phone]  After 6pm go to [email or web address] - password [email or web address]   Office  [phone]

## 2016-12-16 NOTE — Care Management (Signed)
Case discussed with Dr. Juanell Fairly. LTAC screening only requested from [place] with [place] and [place] with [facility]. RNCM will continue to follow.

## 2016-12-16 NOTE — Progress Notes (Signed)
Pharmacy Antibiotic Note/Electrolyte Monitoring  Tiffany Mclean is a 66 y.o. female with a h/o oxygen-dependient COPD admitted on 12/11/2016 with acute respiratory failure.  Pharmacy has been consulted for meropenem dosing as well as electrolyte monitoring and replacement.   Plan: 1. ABX:   5/27: Restart Vancomycin per MD: WBC increased on 5/25, temp 99.5 , no significant improvement. Reculture. Will continue Vancomycin 1 gram IV q12h.  Will check trough prior to 4th dose on 5/28  5/28:  VT @ 20:15 = 17 mcg/mL Will continue this pt on current dose of 1 gm IV Q12H.   -Continue meropenem 1 g iv q 8 hours.   2.Electrolytes:  K 5.1, Na , Phos 3.2.  Will recheck electrolytes with am labs.    Height: 5\' 6"  (167.6 cm) Weight: 132 lb 0.9 oz (59.9 kg) IBW/kg (Calculated) : 59.3  Temp (24hrs), Avg:97.8 F (36.6 C), Min:97 F (36.1 C), Max:99.3 F (37.4 C)   Recent Labs Lab 12/11/16 2252 12/12/16 0303 12/12/16 0705 12/12/16 2126 12/13/16 0545 12/14/16 0245 12/15/16 0433 12/16/16 0429 12/16/16 2015  WBC 11.7* 10.2  --   --  18.2*  --   --   --   --   CREATININE  --  0.68  --   --  0.70 0.51 0.41* 0.43*  --   LATICACIDVEN 3.5* 5.1* 6.2* 1.1 1.1  --   --   --   --   VANCOTROUGH  --   --   --   --   --   --   --   --  17    Estimated Creatinine Clearance: 63.9 mL/min (A) (by C-G formula based on SCr of 0.43 mg/dL (L)).    BMP Latest Ref Rng & Units 12/16/2016 12/15/2016 12/14/2016  Glucose 65 - 99 mg/dL 146(H) 141(H) 118(H)  BUN 6 - 20 mg/dL 25(H) 28(H) 31(H)  Creatinine 0.44 - 1.00 mg/dL 0.43(L) 0.41(L) 0.51  BUN/Creat Ratio 12 - 28 - - -  Sodium 135 - 145 mmol/L 141 147(H) 142  Potassium 3.5 - 5.1 mmol/L 5.1 4.9 4.5  Chloride 101 - 111 mmol/L 100(L) 107 105  CO2 22 - 32 mmol/L 34(H) 35(H) 33(H)  Calcium 8.9 - 10.3 mg/dL 8.7(L) 8.5(L) 8.7(L)   Magnesium (mg/dL)  Date Value  12/13/2016 2.0  11/18/2014 1.6 (L)   Phosphorus (mg/dL)  Date Value  12/16/2016 3.2   11/18/2014 2.5   Albumin (g/dL)  Date Value  12/12/2016 3.2 (L)  06/21/2016 3.8  11/10/2014 2.9 (L)     Allergies  Allergen Reactions  . Asa [Aspirin]   . Avelox [Moxifloxacin]   . Cephalexin   . Codeine   . Erythromycin Ethylsuccinate   . Flagyl [Metronidazole]   . Latex   . Levaquin [Levofloxacin] Hives  . Meloxicam   . Shellfish Allergy Nausea And Vomiting and Other (See Comments)    Dizzy, fainting  . Sulfa Antibiotics   . Topiramate   . Doxycycline Rash  . Tetracyclines & Related Rash    Antimicrobials this admission: meropenem 5/24 >>  vancomycin 5/24 >> 5/25 Vancomycin 5/27 >>  Dose adjustments this admission:   Microbiology results: 5/23 BCx: CNS / diptheroids 2/4 5/23 UCx: NG  Sputum: sent  5/24 MRSA PCR: negative  Thank you for allowing pharmacy to be a part of this patient's care.  Thekla Colborn D 12/16/2016 9:25 PM

## 2016-12-17 DIAGNOSIS — G9341 Metabolic encephalopathy: Secondary | ICD-10-CM | POA: Diagnosis not present

## 2016-12-17 DIAGNOSIS — Z515 Encounter for palliative care: Secondary | ICD-10-CM | POA: Diagnosis not present

## 2016-12-17 DIAGNOSIS — Z7189 Other specified counseling: Secondary | ICD-10-CM | POA: Diagnosis not present

## 2016-12-17 DIAGNOSIS — J441 Chronic obstructive pulmonary disease with (acute) exacerbation: Secondary | ICD-10-CM | POA: Diagnosis not present

## 2016-12-17 DIAGNOSIS — Z9911 Dependence on respirator [ventilator] status: Secondary | ICD-10-CM | POA: Diagnosis not present

## 2016-12-17 LAB — GLUCOSE, CAPILLARY
Glucose-Capillary: 136 mg/dL — ABNORMAL HIGH (ref 65–99)
Glucose-Capillary: 137 mg/dL — ABNORMAL HIGH (ref 65–99)
Glucose-Capillary: 137 mg/dL — ABNORMAL HIGH (ref 65–99)
Glucose-Capillary: 148 mg/dL — ABNORMAL HIGH (ref 65–99)
Glucose-Capillary: 150 mg/dL — ABNORMAL HIGH (ref 65–99)

## 2016-12-17 MED ORDER — MIDAZOLAM HCL 2 MG/2ML IJ SOLN: 2.0000 mg | INTRAMUSCULAR | Status: DC | PRN

## 2016-12-17 MED ORDER — FENTANYL CITRATE (PF) 100 MCG/2ML IJ SOLN: 50.0000 ug | INTRAMUSCULAR | Status: DC | PRN

## 2016-12-17 MED ORDER — VITAL HIGH PROTEIN PO LIQD
1000.0000 mL | ORAL | Status: DC
  Administered 2016-12-17: 1000 mL

## 2016-12-17 MED ORDER — VITAL HIGH PROTEIN PO LIQD: 1000.0000 mL | ORAL | Status: DC

## 2016-12-17 NOTE — Progress Notes (Signed)
[place] at [facility]                                                                                                                                                                                  Patient Demographics   Tiffany Mclean, is a 66 y.o. female, DOB - 02-20-1951, KDX:[id number]  Admit date - 12/11/2016   Admitting Physician Harvie Bridge, DO  Outpatient Primary MD for the patient is Fisher, Kirstie Peri, MD   LOS - 5  Subjective: Patient currently sedated and remains on the ventilator.And is on Levophed.  Review of Systems:   CONSTITUTIONAL: Intubated  Vitals:   Vitals:   12/17/16 1030 12/17/16 1100 12/17/16 1129 12/17/16 1130  BP: (!) 98/55 (!) 100/58  (!) 101/55  Pulse: (!) 58 (!) 59  60  Resp: _0 Temp: 98.4 F (36.9 C) 98.6 F (37 C)  98.6 F (37 C)  TempSrc:      SpO2: 97% 97% 97% 97%  Weight:      Height:        Wt Readings from Last 3 Encounters:  12/12/16 59.9 kg (132 lb 0.9 oz)  05/30/16 59.9 kg (132 lb)  04/21/16 61.7 kg (136 lb)     Intake/Output Summary (Last 24 hours) at 12/17/16 1341 Last data filed at 12/17/16 1100  Gross per 24 hour  Intake          4135.73 ml  Output             3550 ml  Net           585.73 ml    Physical Exam:   GENERAL: Intubated critically ill HEAD, EYES, EARS, NOSE AND THROAT: Atraumatic, normocephalic. Extraocular muscles are intact. Pupils equal and reactive to light. Sclerae anicteric. No conjunctival injection. No oro-pharyngeal erythema.  NECK: Supple. There is no jugular venous distention. No bruits, no lymphadenopathy, no thyromegaly.  HEART: Regular rate and rhythm,. No murmurs, no rubs, no clicks.  LUNGS: Decreased breath sounds bilaterally on the ventilator ABDOMEN: Soft, flat, nontender, nondistended. Has good bowel sounds. No hepatosplenomegaly appreciated.  EXTREMITIES: No evidence of any cyanosis, clubbing, or peripheral edema.  +2 pedal and radial pulses  bilaterally.  NEUROLOGIC: Sedated  SKIN: Moist and warm with no rashes appreciated.  Psych: Sedated LN: No inguinal LN enlargement    Antibiotics   Anti-infectives    Start     Dose/Rate Route Frequency Ordered Stop   12/15/16 0900  vancomycin (VANCOCIN) IVPB 1000 mg/200 mL premix  Status:  Discontinued     1,000 mg 200 mL/hr over 60 Minutes Intravenous Every 12 hours 12/15/16 0832 12/17/16  1127   12/13/16 0400  vancomycin (VANCOCIN) IVPB 1000 mg/200 mL premix  Status:  Discontinued     1,000 mg 200 mL/hr over 60 Minutes Intravenous Every 12 hours 12/12/16 1922 12/13/16 1110   12/12/16 2000  vancomycin (VANCOCIN) IVPB 1000 mg/200 mL premix     1,000 mg 200 mL/hr over 60 Minutes Intravenous  Once 12/12/16 1921 12/12/16 2108   12/12/16 1000  vancomycin (VANCOCIN) IVPB 1000 mg/200 mL premix  Status:  Discontinued     1,000 mg 200 mL/hr over 60 Minutes Intravenous Every 12 hours 12/12/16 0330 12/12/16 1219   12/12/16 0115  vancomycin (VANCOCIN) IVPB 1000 mg/200 mL premix     1,000 mg 200 mL/hr over 60 Minutes Intravenous  Once 12/12/16 0110 12/12/16 0336   12/11/16 2338  meropenem (MERREM) 500 MG/50ML IVPB SOLR    Comments:  David, Raquel: cabinet override      12/11/16 2338 12/12/16 1144   12/11/16 2330  meropenem (MERREM) 1 g in sodium chloride 0.9 % 100 mL IVPB     1 g 200 mL/hr over 30 Minutes Intravenous Every 8 hours 12/11/16 2316        Medications   Scheduled Meds: . budesonide (PULMICORT) nebulizer solution  0.25 mg Nebulization Q6H  . chlorhexidine gluconate (MEDLINE KIT)  15 mL Mouth Rinse BID  . enoxaparin (LOVENOX) injection  40 mg Subcutaneous Q24H  . free water  200 mL Per Tube Q8H  . insulin aspart  0-15 Units Subcutaneous Q4H  . ipratropium-albuterol  3 mL Nebulization Q6H  . mouth rinse  15 mL Mouth Rinse QID  . methylPREDNISolone (SOLU-MEDROL) injection  40 mg Intravenous Q12H  . pantoprazole (PROTONIX) IV  40 mg Intravenous Q24H   Continuous  Infusions: . dextrose 50 mL/hr at 12/17/16 1100  . feeding supplement (VITAL HIGH PROTEIN)    . fentaNYL infusion INTRAVENOUS 300 mcg/hr (12/17/16 1100)  . meropenem (MERREM) IV Stopped (12/17/16 0617)  . midazolam (VERSED) infusion 2.5 mg/hr (12/17/16 1100)  . norepinephrine (LEVOPHED) Adult infusion 4 mcg/min (12/17/16 1100)  . propofol (DIPRIVAN) infusion 20.033 mcg/kg/min (12/17/16 1100)   PRN Meds:.acetaminophen **OR** acetaminophen, albuterol, bisacodyl, bisacodyl, fentaNYL (SUBLIMAZE) injection, magnesium citrate, midazolam, [DISCONTINUED] ondansetron **OR** ondansetron (ZOFRAN) IV, senna-docusate, sennosides   Data Review:   Micro Results Recent Results (from the past 240 hour(s))  Blood Culture (routine x 2)     Status: Abnormal   Collection Time: 12/11/16 10:52 PM  Result Value Ref Range Status   Specimen Description BLOOD RT HAND  Final   Special Requests   Final    BOTTLES DRAWN AEROBIC AND ANAEROBIC Blood Culture adequate volume   Culture  Setup Time   Final    GRAM POSITIVE COCCI GRAM POSITIVE RODS IN BOTH AEROBIC AND ANAEROBIC BOTTLES CRITICAL RESULT CALLED TO, READ BACK BY AND VERIFIED WITH: JASON ROBBINS AT 1808 12/12/2016 BY TFK. JASON ROBBINS AT 2120 ON 12/12/2016 JJB    Culture (A)  Final    STAPHYLOCOCCUS SPECIES (COAGULASE NEGATIVE) THE SIGNIFICANCE OF ISOLATING THIS ORGANISM FROM A SINGLE SET OF BLOOD CULTURES WHEN MULTIPLE SETS ARE DRAWN IS UNCERTAIN. PLEASE NOTIFY THE MICROBIOLOGY DEPARTMENT WITHIN ONE WEEK IF SPECIATION AND SENSITIVITIES ARE REQUIRED. DIPHTHEROIDS(CORYNEBACTERIUM SPECIES) Standardized susceptibility testing for this organism is not available. Performed at [facility], [place] [address], [place], [place] [address]    Report Status 12/14/2016 FINAL  Final  Blood Culture (routine x 2)     Status: None   Collection Time: 12/11/16 10:52 PM  Result Value Ref Range Status   Specimen Description BLOOD L FA  Final   Special Requests  BOTTLES DRAWN AEROBIC AND ANAEROBIC [place]  Final   Culture NO GROWTH 5 DAYS  Final   Report Status 12/16/2016 FINAL  Final  Urine culture     Status: None   Collection Time: 12/11/16 10:52 PM  Result Value Ref Range Status   Specimen Description URINE, RANDOM  Final   Special Requests NONE  Final   Culture   Final    NO GROWTH Performed at [facility], [place] [address] [place], [place] [address]    Report Status 12/13/2016 FINAL  Final  Blood Culture ID Panel (Reflexed)     Status: Abnormal   Collection Time: 12/11/16 10:52 PM  Result Value Ref Range Status   Enterococcus species NOT DETECTED NOT DETECTED Final   Listeria monocytogenes NOT DETECTED NOT DETECTED Final   Staphylococcus species DETECTED (A) NOT DETECTED Final    Comment: Methicillin (oxacillin) resistant coagulase negative staphylococcus. Possible blood culture contaminant (unless isolated from more than one blood culture draw or clinical case suggests pathogenicity). No antibiotic treatment is indicated for blood  culture contaminants. CRITICAL RESULT CALLED TO, READ BACK BY AND VERIFIED WITH: JASON ROBBINS AT 1808 12/12/2016 BY TFK.    Staphylococcus aureus NOT DETECTED NOT DETECTED Final   Methicillin resistance DETECTED (A) NOT DETECTED Final    Comment: CRITICAL RESULT CALLED TO, READ BACK BY AND VERIFIED WITH: JASON ROBBINS AT 5597 12/12/2016 BY TFK.    Streptococcus species NOT DETECTED NOT DETECTED Final   Streptococcus agalactiae NOT DETECTED NOT DETECTED Final   Streptococcus pneumoniae NOT DETECTED NOT DETECTED Final   Streptococcus pyogenes NOT DETECTED NOT DETECTED Final   Acinetobacter baumannii NOT DETECTED NOT DETECTED Final   Enterobacteriaceae species NOT DETECTED NOT DETECTED Final   Enterobacter cloacae complex NOT DETECTED NOT DETECTED Final   Escherichia coli NOT DETECTED NOT DETECTED Final   Klebsiella oxytoca NOT DETECTED NOT DETECTED Final   Klebsiella pneumoniae NOT DETECTED NOT  DETECTED Final   Proteus species NOT DETECTED NOT DETECTED Final   Serratia marcescens NOT DETECTED NOT DETECTED Final   Haemophilus influenzae NOT DETECTED NOT DETECTED Final   Neisseria meningitidis NOT DETECTED NOT DETECTED Final   Pseudomonas aeruginosa NOT DETECTED NOT DETECTED Final   Candida albicans NOT DETECTED NOT DETECTED Final   Candida glabrata NOT DETECTED NOT DETECTED Final   Candida krusei NOT DETECTED NOT DETECTED Final   Candida parapsilosis NOT DETECTED NOT DETECTED Final   Candida tropicalis NOT DETECTED NOT DETECTED Final  MRSA PCR Screening     Status: None   Collection Time: 12/12/16  2:17 AM  Result Value Ref Range Status   MRSA by PCR NEGATIVE NEGATIVE Final    Comment:        The GeneXpert MRSA Assay (FDA approved for NASAL specimens only), is one component of a comprehensive MRSA colonization surveillance program. It is not intended to diagnose MRSA infection nor to guide or monitor treatment for MRSA infections.   CULTURE, BLOOD (ROUTINE X 2) w Reflex to ID Panel     Status: None (Preliminary result)   Collection Time: 12/15/16  8:19 AM  Result Value Ref Range Status   Specimen Description BLOOD RIGHT WRIST  Final   Special Requests   Final    BOTTLES DRAWN AEROBIC AND ANAEROBIC Blood Culture adequate volume   Culture NO GROWTH 2 DAYS  Final   Report Status PENDING  Incomplete  CULTURE, BLOOD (ROUTINE X 2) w Reflex to ID Panel     Status: Abnormal (Preliminary result)   Collection Time: 12/15/16  8:39 AM  Result Value Ref Range Status   Specimen Description BLOOD LEFT HAND  Final   Special Requests AEROCOCCUS SPECIES Blood Culture adequate volume (A)  Final   Culture NO GROWTH 2 DAYS  Final   Report Status PENDING  Incomplete  Culture, respiratory (NON-Expectorated)     Status: None (Preliminary result)   Collection Time: 12/15/16  8:40 AM  Result Value Ref Range Status   Specimen Description TRACHEAL ASPIRATE  Final   Special Requests NONE   Final   Gram Stain   Final    RARE WBC PRESENT,BOTH PMN AND MONONUCLEAR FEW GRAM POSITIVE COCCI    Culture   Final    MODERATE STAPHYLOCOCCUS EPIDERMIDIS SUSCEPTIBILITIES TO FOLLOW Performed at [facility], [place] [address], [place], [place] [address]    Report Status PENDING  Incomplete    Radiology Reports Dg Abd 1 View  Result Date: 12/12/2016 CLINICAL DATA:  NG tube placement EXAM: ABDOMEN - 1 VIEW COMPARISON:  None. FINDINGS: Enteric tube tip is in the left upper quadrant consistent with location in the upper stomach. Emphysematous changes suggested in the lung bases. IMPRESSION: Enteric tube tip is in the left upper quadrant consistent with location in the upper stomach. Electronically Signed   By: Lucienne Capers M.D.   On: 12/12/2016 03:57   Ct Angio Chest Pe W Or Wo Contrast  Result Date: 12/12/2016 CLINICAL DATA:  Acute onset altered mental status. Confusion. Hypoxia. Initial encounter. EXAM: CT ANGIOGRAPHY CHEST WITH CONTRAST TECHNIQUE: Multidetector CT imaging of the chest was performed using the standard protocol during bolus administration of intravenous contrast. Multiplanar CT image reconstructions and MIPs were obtained to evaluate the vascular anatomy. CONTRAST:  75 mL of Isovue 370 IV contrast COMPARISON:  Chest radiograph performed earlier today at 12:11 a.m., and CTA of the chest performed 08/25/2015 FINDINGS: Cardiovascular:  There is no evidence of pulmonary embolus. The heart is normal in size. The thoracic aorta is grossly unremarkable. The great vessels are within normal limits. Mediastinum/Nodes: The mediastinum is unremarkable in appearance. No mediastinal lymphadenopathy is seen. No pericardial effusion is identified. The patient's endotracheal tube is seen ending 3 cm above the carina. The thyroid gland is unremarkable. No axillary lymphadenopathy is appreciated. Lungs/Pleura: Bilateral emphysema is noted. Minimal left basilar atelectasis or scarring is noted.  No pleural effusion or pneumothorax is seen. No masses are identified. Upper Abdomen: The visualized portions of the liver and spleen are grossly unremarkable. Musculoskeletal: No acute osseous abnormalities are identified. The visualized musculature is unremarkable in appearance. There is suggestion of chronic disruption of the patient left breast implant. Review of the MIP images confirms the above findings. IMPRESSION: 1. No evidence of pulmonary embolus. 2. Bilateral emphysema noted. Minimal left basilar atelectasis or scarring noted. Electronically Signed   By: Garald Balding M.D.   On: 12/12/2016 02:20   Dg Chest Port 1 View  Result Date: 12/15/2016 CLINICAL DATA:  Acute on chronic respiratory failure. EXAM: PORTABLE CHEST 1 VIEW COMPARISON:  Radiographs of Dec 13, 2016. FINDINGS: The heart size and mediastinal contours are within normal limits. Endotracheal and nasogastric tubes are unchanged in position. No pneumothorax or pleural effusion is noted. Emphysematous disease is noted in the upper lobes bilaterally. Hyperexpansion of the lungs is noted. Both lungs are clear. The visualized skeletal structures are unremarkable. IMPRESSION: Findings  consistent with chronic obstructive pulmonary disease. Stable support apparatus. No acute abnormality seen. Electronically Signed   By: Marijo Conception, M.D.   On: 12/15/2016 08:55   Dg Chest Port 1 View  Result Date: 12/13/2016 CLINICAL DATA:  Respiratory failure. EXAM: PORTABLE CHEST 1 VIEW COMPARISON:  CT 12/12/2016. Chest x-ray 12/12/2016, 12/11/2016, 04/17/2016 FINDINGS: Endotracheal tube, NG tube in stable position. Heart size normal. Increased density noted over the lungs most likely related to overlying breast implants. Mild bilateral pleural-parenchymal thickening noted most likely related scarring. COPD . No pleural effusion or pneumothorax. Prior cervical spine fusion . IMPRESSION: 1.  Lines and tubes in stable position. 2. Pleuroparenchymal  thickening consistent scarring. COPD. Similar findings noted on prior exam . No acute abnormality. Electronically Signed   By: [name]  Register   On: 12/13/2016 07:06   Dg Chest Portable 1 View  Result Date: 12/12/2016 CLINICAL DATA:  Post intubation EXAM: PORTABLE CHEST 1 VIEW COMPARISON:  12/11/2016 FINDINGS: Interval placement of an endotracheal tube with tip measuring 5.3 cm above the carina. Heart size and pulmonary vascularity are normal. Emphysematous changes in the lungs with scattered fibrosis. No focal consolidation or airspace disease. No blunting of costophrenic angles. No pneumothorax. Postoperative changes in the cervical spine. IMPRESSION: Endotracheal tube tip measures 5.3 cm above the carina, in good apparent location. Electronically Signed   By: Lucienne Capers M.D.   On: 12/12/2016 00:39   Dg Chest Port 1 View  Result Date: 12/12/2016 CLINICAL DATA:  Altered mental status EXAM: PORTABLE CHEST 1 VIEW COMPARISON:  04/17/2016 FINDINGS: Cervical spine hardware. Hyperinflation with emphysematous disease and bullous change in the right greater than left upper lobes. No acute consolidation or effusion. Stable cardiomediastinal silhouette. No pneumothorax. IMPRESSION: Hyperinflation with emphysematous disease. No acute infiltrate or edema Electronically Signed   By: Donavan Foil M.D.   On: 12/12/2016 00:11     CBC  Recent Labs Lab 12/11/16 2252 12/12/16 0303 12/13/16 0545  WBC 11.7* 10.2 18.2*  HGB 11.2* 10.2* 8.3*  HCT 35.5 32.8* 26.7*  PLT 215 177 172  MCV 89.0 90.7 88.0  MCH 28.1 28.3 27.2  MCHC 31.5* 31.2* 30.9*  RDW 13.6 13.5 13.9  LYMPHSABS 1.3  --   --   MONOABS 0.7  --   --   EOSABS 0.0  --   --   BASOSABS 0.0  --   --     Chemistries   Recent Labs Lab 12/11/16 2251 12/12/16 0303 12/13/16 0545 12/14/16 0245 12/15/16 0433 12/16/16 0429  NA 144 142 141 142 147* 141  K 4.6 4.1 4.1 4.5 4.9 5.1  CL 92* 97* 104 105 107 100*  CO2 43* 35* 34* 33* 35* 34*   GLUCOSE 178* 250* 164* 118* 141* 146*  BUN 33* 27* 36* 31* 28* 25*  CREATININE 0.68 0.68 0.70 0.51 0.41* 0.43*  CALCIUM 9.8 8.6* 8.6* 8.7* 8.5* 8.7*  MG 1.8 2.2 2.0  --   --   --   AST 58* 64*  --   --   --   --   ALT 31 32  --   --   --   --   ALKPHOS 100 86  --   --   --   --   BILITOT 0.9 0.8  --   --   --   --    ------------------------------------------------------------------------------------------------------------------ estimated creatinine clearance is 63.9 mL/min (A) (by C-G formula based on SCr of 0.43 mg/dL (L)). ------------------------------------------------------------------------------------------------------------------ No results for  input(s): HGBA1C in the last 72 hours. ------------------------------------------------------------------------------------------------------------------ No results for input(s): CHOL, HDL, LDLCALC, TRIG, CHOLHDL, LDLDIRECT in the last 72 hours. ------------------------------------------------------------------------------------------------------------------ No results for input(s): TSH, T4TOTAL, T3FREE, THYROIDAB in the last 72 hours.  Invalid input(s): FREET3 ------------------------------------------------------------------------------------------------------------------ No results for input(s): VITAMINB12, FOLATE, FERRITIN, TIBC, IRON, RETICCTPCT in the last 72 hours.  Coagulation profile  Recent Labs Lab 12/12/16 0303  INR 0.98    No results for input(s): DDIMER in the last 72 hours.  Cardiac Enzymes No results for input(s): CKMB, TROPONINI, MYOGLOBIN in the last 168 hours.  Invalid input(s): CK ------------------------------------------------------------------------------------------------------------------ Invalid input(s): [place]   This is a 66 y.o. 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 Acute respiratory failure with hypoxia and hypercapnia, in a patient with home-O2 dependent COPD.   CT scan of the chest shows no pulmonary embolism Continue Mechanical ventilation per pulmonary. Wean as toleratedAs per pulmonary note husband may be leaning towards terminal weaning palliative care consult has been obtained,  Discussed with patient's husband, he is waiting for his sons to come from [place] were expected to come tomorrow for terminal  extubation.   #. Sepsis, with positive blood cultures due to coag-negative staph continue anabiotic, blood culture showed contamination - Continue IV antibiotics: meropenum  #. History of fibromyalgia/depression -Hold Cymbalta - Chronic opiate use  #. History of hyperlipidemia - hold Lipitor  #. History of GERD - hold AcipHex  Updated husband on the condition and status Prognosis really poor.     Code Status Orders        Start     Ordered   12/12/16 1202  Do not attempt resuscitation (DNR)  Continuous    Question Answer Comment  In the event of cardiac or respiratory ARREST Do not call a "code blue"   In the event of cardiac or respiratory ARREST Do not perform Intubation, CPR, defibrillation or ACLS   In the event of cardiac or respiratory ARREST Use medication by any route, position, wound care, and other measures to relive pain and suffering. May use oxygen, suction and manual treatment of airway obstruction as needed for comfort.      12/12/16 1219    Code Status History    Date Active Date Inactive Code Status Order ID Comments User Context   12/12/2016  2:18 AM 12/12/2016 12:19 PM Full Code [id number]  HugelmeyerUbaldo Glassing, DO Inpatient   04/17/2016 11:13 PM 04/21/2016  6:10 PM Full Code [id number]  Holley Raring, NP ED    Advance Directive Documentation     Most Recent Value  Type of Advance Directive  Living will  Pre-existing out of facility DNR order (yellow form or  pink MOST form)  -  "MOST" Form in Place?  -           Consults  Intensivists DVT Prophylaxis  Lovenox   Lab Results  Component Value Date   PLT 172 12/13/2016     Time Spent in minutes 110mn spent  Greater than 50% of time spent in care coordination and counseling patient regarding the condition and plan of care.   KEpifanio LeschesM.D on 12/17/2016 at 1:41 PM  Between 7am to 6pm - Pager - [phone]  After 6pm go to [email or web address] - password [email or web address]   Office  [phone]

## 2016-12-17 NOTE — Progress Notes (Signed)
Nutrition Follow-up  DOCUMENTATION CODES:   Not applicable  INTERVENTION:  Switch to Vital High Protein @ 69mL/hr. With propofol @ 7.30mL/hr and 200cc free water Q8H, provides 1390 calories, 105gm protein, 1603cc free water Recommend daily weights  NUTRITION DIAGNOSIS:   Inadequate oral intake related to inability to eat as evidenced by NPO status. -ongoing  GOAL:   Patient will meet greater than or equal to 90% of their needs -meeting w/ TF  MONITOR:   Labs, Weight trends, I & O's, Vent status, TF tolerance  ASSESSMENT:   This is a 66 year old Caucasian female presenting with acute hypercarbic/hypoxic respiratory failure, sepsis, septic shock, acute encephalopathy and lactic acidosis  Patient is currently intubated on ventilator support MV: 6.8 L/min Temp (24hrs), Avg:98.8 F (37.1 C), Min:97.9 F (36.6 C), Max:99.7 F (37.6 C) Propofol: 7.2 ml/hr --> 190 calories Needs re-estimated Discussed in Rounds Being considered for LTAC  Labs and medications reviewed: Solumedrol D5 @ 66mL/hr --> 204 calories Fentanyl gtt, Versed gtt, Levo gtt  Diet Order:  Diet NPO time specified  Skin:  Reviewed, no issues  Last BM:  12/11/2016  Height:   Ht Readings from Last 1 Encounters:  12/12/16 5\' 6"  (1.676 m)    Weight:   Wt Readings from Last 1 Encounters:  12/12/16 132 lb 0.9 oz (59.9 kg)    Ideal Body Weight:  59.09 kg  BMI:  Body mass index is 21.31 kg/m.  Estimated Nutritional Needs:   Kcal:  1387 calories  Protein:  72-90 gm protein  Fluid:  >/= 1.6L  EDUCATION NEEDS:   Education needs no appropriate at this time  Satira Anis. Nancylee Gaines, MS, RD LDN Inpatient Clinical Dietitian Pager [phone]

## 2016-12-17 NOTE — Progress Notes (Signed)
PULMONARY / CRITICAL CARE MEDICINE   Name: Tiffany Mclean MRN: [id number] DOB: 1951-03-05    ADMISSION DATE:  12/11/2016    Synopsis:   66 yo female admitted 05/23 with acute on chronic hypercapnic hypoxic respiratory failure secondary to AECOPD, acute encephalopathy, lactic acidosis, and ?sepsis requiring mechanical intubation  REVIEW OF SYSTEMS:   Unable to obtain as patient is currently intubated  SUBJECTIVE:  Pt remain intubated.   VITAL SIGNS: BP (!) 101/55   Pulse 60   Temp 98.6 F (37 C)   Resp 15   Ht 5\' 6"  (1.676 m)   Wt 132 lb 0.9 oz (59.9 kg)   SpO2 97%   BMI 21.31 kg/m   HEMODYNAMICS:    VENTILATOR SETTINGS: Vent Mode: PRVC FiO2 (%):  [28 %] 28 % Set Rate:  [15 bmp] 15 bmp Vt Set:  [450 mL] 450 mL PEEP:  [5 cmH20] 5 cmH20  INTAKE / OUTPUT: I/O last 3 completed shifts: In: 6726.7 [I.V.:3581.7; Other:10; NG/GT:2635; IV Piggyback:500] Out: 4200 [Urine:4200]  PHYSICAL EXAMINATION: General:NAD mechanically intubated  Neuro: sedated, moves upper and lower extremities, still withdraws from painful stimulation, PERRL HEENT: trachea midline, no JVD Cardiovascular: nsr, s1s2, no M/R/G Lungs:decreased breath sounds throughout, no wheezes or rhonchi, even non labored  Abdomen: Soft, nontender, nondistended, +BS x4 Musculoskeletal:  No joint deformities, no edema Skin: Warm and dry  LABS:  BMET  Recent Labs Lab 12/14/16 0245 12/15/16 0433 12/16/16 0429  NA 142 147* 141  K 4.5 4.9 5.1  CL 105 107 100*  CO2 33* 35* 34*  BUN 31* 28* 25*  CREATININE 0.51 0.41* 0.43*  GLUCOSE 118* 141* 146*    Electrolytes  Recent Labs Lab 12/11/16 2251  12/12/16 0303 12/13/16 0545 12/14/16 0245 12/15/16 0433 12/16/16 0429  CALCIUM 9.8  --  8.6* 8.6* 8.7* 8.5* 8.7*  MG 1.8  --  2.2 2.0  --   --   --   PHOS  --   < > 2.1* 1.1* 2.4* 2.1* 3.2  < > = values in this interval not displayed.  CBC  Recent Labs Lab 12/11/16 2252 12/12/16 0303  12/13/16 0545  WBC 11.7* 10.2 18.2*  HGB 11.2* 10.2* 8.3*  HCT 35.5 32.8* 26.7*  PLT 215 177 172    Coag's  Recent Labs Lab 12/12/16 0303  APTT <24*  INR 0.98    Sepsis Markers  Recent Labs Lab 12/12/16 0303 12/12/16 0705 12/12/16 2126 12/13/16 0545 12/14/16 0245  LATICACIDVEN 5.1* 6.2* 1.1 1.1  --   PROCALCITON <0.10  --   --  <0.10 <0.10    ABG  Recent Labs Lab 12/12/16 0105 12/12/16 0458 12/15/16 0830  PHART 7.00* 7.40 7.39  PCO2ART 120* 55* 66*  PO2ART 156* 263* 68*    Liver Enzymes  Recent Labs Lab 12/11/16 2251 12/12/16 0303  AST 58* 64*  ALT 31 32  ALKPHOS 100 86  BILITOT 0.9 0.8  ALBUMIN 3.9 3.2*    Cardiac Enzymes No results for input(s): TROPONINI, PROBNP in the last 168 hours.  Glucose  Recent Labs Lab 12/16/16 1611 12/16/16 2016 12/17/16 0001 12/17/16 0357 12/17/16 0755 12/17/16 1201  GLUCAP 153* 136* 137* 150* 148* 137*    Imaging No results found.  STUDIES:  CT Angio Chest 05/24>>No evidence of pulmonary embolus. Bilateral emphysema noted. Minimal left basilar atelectasis or scarring noted.  CULTURES: Blood cultures 2 05/23>>negative x1 bottle, staph species x1 bottle likely contaminate  Urine culture 05/23>>negative  Sputum>>  ANTIBIOTICS: Vancomycin  05/241 Meropenem 05/24 >>   SIGNIFICANT EVENTS: 12/12/16: Admitted with acute hypercarbic respiratory failure mechanically intubated   LINES/TUBES: Peripheral IVs  DISCUSSION: This is a 66 year old Caucasian female presenting with acute hypercarbic/hypoxic respiratory failure, sepsis, septic shock, acute encephalopathy and lactic acidosis  ASSESSMENT / PLAN:  PULMONARY A: Acute hypercarbic/hypoxic respiratory failure Acute on chronic COPD exacerbation CXR images personally reviewed; continued hyperinflation from severe emphysema.   P:   One way extubation planned later today or tomorrow morning.   Vent Mode: PRVC FiO2 (%):  [28 %] 28 % Set Rate:   [15 bmp] 15 bmp Vt Set:  [450 mL] 450 mL PEEP:  [5 cmH20] 5 cmH20   INFECTIOUS A:   Possible sepsis of unknown etiology although unlikely  P:   Broad-spectrum antibiotics.   ENDOCRINE A:   History of type 2 diabetes P:   Blood glucose monitoring with sliding scale insulin coverage  NEUROLOGIC A:   Acute hypercarbic and metabolic encephalopathy P:   RASS goal: -1 to -2 WUA daily  FAMILY  Discussed with husband at bedside again today. He is now understanding and accepting of the patient's imminent passing. He has called his family to present to the hospital, he is waiting for their arrival, plan for withdrawal of care related to their early tomorrow morning.  Marda Stalker, M.D.  Pulmonary/Critical Care PCCM Consult Pager [phone] (please enter 7 digits)   12/17/2016

## 2016-12-17 NOTE — Progress Notes (Signed)
Pt Oxygen levels dropped to 85 % and maintained at this level. ETT suctioned, connections checked. Pt FiO2 increased to 40%. Pt now sustaining 93-94%.  1430 Pt experienced one episode of increased agitation. Pt Hr increased to 115 and then decreased to 44 before returning to 58. Pt bolused with PRN versed and Fentanyl per MAR. Pt now maintaining at 55-60 bpm in Sinus Rhythm.   Family refusing turning patient and patients mouth care due to patient becoming aggitated with each occurrence. Pt family also requested no more CBG finger sticks. This information was relayed to the Palliative care, NP. Will continue to assess.

## 2016-12-17 NOTE — Progress Notes (Signed)
Daily Progress Note   Patient Name: Tiffany Mclean       Date: 12/17/2016 DOB: 07-Nov-1950  Age: 66 y.o. MRN#: [id number] Attending Physician: Epifanio Lesches, MD Primary Care Physician: Birdie Sons, MD Admit Date: 12/11/2016  Reason for Consultation/Follow-up: Establishing goals of care, Extended family meeting  Subjective: Tiffany Mclean is resting comfortably on vent. Family at bedside.   Length of Stay: 5  Current Medications: Scheduled Meds:  . budesonide (PULMICORT) nebulizer solution  0.25 mg Nebulization Q6H  . chlorhexidine gluconate (MEDLINE KIT)  15 mL Mouth Rinse BID  . enoxaparin (LOVENOX) injection  40 mg Subcutaneous Q24H  . free water  200 mL Per Tube Q8H  . insulin aspart  0-15 Units Subcutaneous Q4H  . ipratropium-albuterol  3 mL Nebulization Q6H  . mouth rinse  15 mL Mouth Rinse QID  . methylPREDNISolone (SOLU-MEDROL) injection  40 mg Intravenous Q12H  . pantoprazole (PROTONIX) IV  40 mg Intravenous Q24H    Continuous Infusions: . dextrose 50 mL/hr at 12/17/16 1100  . feeding supplement (VITAL HIGH PROTEIN)    . fentaNYL infusion INTRAVENOUS 300 mcg/hr (12/17/16 1100)  . meropenem (MERREM) IV Stopped (12/17/16 0617)  . midazolam (VERSED) infusion 2.5 mg/hr (12/17/16 1100)  . norepinephrine (LEVOPHED) Adult infusion 4 mcg/min (12/17/16 1100)  . propofol (DIPRIVAN) infusion 20.033 mcg/kg/min (12/17/16 1100)    PRN Meds: acetaminophen **OR** acetaminophen, albuterol, bisacodyl, bisacodyl, fentaNYL (SUBLIMAZE) injection, magnesium citrate, midazolam, [DISCONTINUED] ondansetron **OR** ondansetron (ZOFRAN) IV, senna-docusate, sennosides  Physical Exam  Constitutional: She appears well-developed and well-nourished. She is intubated.  Cardiovascular: Regular  rhythm.  Bradycardia present.   Pulmonary/Chest: No accessory muscle usage. No tachypnea and no bradypnea. She is intubated. No respiratory distress.  Nursing note and vitals reviewed.           Vital Signs: BP (!) 101/55   Pulse 60   Temp 98.6 F (37 C)   Resp 15   Ht 5' 6"  (1.676 m)   Wt 59.9 kg (132 lb 0.9 oz)   SpO2 97%   BMI 21.31 kg/m  SpO2: SpO2: 97 % O2 Device: O2 Device: Ventilator O2 Flow Rate: O2 Flow Rate (L/min): 5 L/min  Intake/output summary:  Intake/Output Summary (Last 24 hours) at 12/17/16 1339 Last data filed at 12/17/16 1100  Gross per 24 hour  Intake  4135.73 ml  Output             3550 ml  Net           585.73 ml   LBM: Last BM Date: 12/11/16 Baseline Weight: Weight: 59.9 kg (132 lb) Most recent weight: Weight: 59.9 kg (132 lb 0.9 oz)       Palliative Assessment/Data:    Flowsheet Rows     Most Recent Value  Intake Tab  Referral Department  Critical care  Unit at Time of Referral  ICU  Palliative Care Primary Diagnosis  Pulmonary  Date Notified  12/12/16  Palliative Care Type  New Palliative care  Reason for referral  Clarify Goals of Care, Counsel Regarding Hospice, Non-pain Symptom  Date of Admission  12/12/16  Date first seen by Palliative Care  12/13/16  # of days Palliative referral response time  1 Day(s)  # of days IP prior to Palliative referral  0  Clinical Assessment  Palliative Performance Scale Score  20%  Anxiety Max Last 24 Hours  8  Anxiety Min Last 24 Hours  4  Psychosocial & Spiritual Assessment  Palliative Care Outcomes  Patient/Family meeting held?  Yes  Who was at the meeting?  patient and husband [name]  Palliative Care Outcomes  Improved non-pain symptom therapy, Clarified goals of care, Counseled regarding hospice      Patient Active Problem List   Diagnosis Date Noted  . Acute metabolic encephalopathy   . Goals of care, counseling/discussion   . Palliative care encounter   . Cancer ([place])   . COPD  (chronic obstructive pulmonary disease) ([place])   . Diabetes mellitus without complication ([place])   . Severe sepsis ([place])   . MRSA bacteremia   . Respiratory failure ([place]) 12/12/2016  . Dysphagia 04/21/2016  . COPD exacerbation ([place]) 04/17/2016  . Tremor 06/13/2015  . Depression 05/15/2015  . Anxiety 02/13/2015  . Dermatitis, eczematoid 02/13/2015  . Hypokalemia 02/13/2015  . Lower extremity weakness 02/13/2015  . Leukocytosis 02/13/2015  . Magnesium deficiency 02/13/2015  . Symptomatic menopausal or female climacteric states 02/13/2015  . Panic disorder 02/13/2015  . Bed sore on buttock 02/08/2015  . COPD with emphysema ([place]) 11/28/2014  . Chronic respiratory failure with hypoxia ([place]) 11/28/2014  . GERD (gastroesophageal reflux disease) 11/28/2014  . Anemia of chronic disease 11/28/2014  . Pneumonia 11/23/2014  . Generalized anxiety disorder 11/23/2014  . Injury of kidney 10/31/2014  . Personal history of perinatal problems 10/24/2014  . Chronic pain associated with significant psychosocial dysfunction 02/21/2010  . Anemia, iron deficiency 01/29/2010  . Allergic rhinitis 12/01/2009  . DDD (degenerative disc disease), cervical 12/01/2009  . Major depression 06/30/2009  . Abdominal pain, generalized 03/21/2009  . Vitamin D deficiency 07/22/2008  . Hypercholesterolemia without hypertriglyceridemia 03/11/2007  . Essential and other specified forms of tremor 07/22/1998    Palliative Care Assessment & Plan   HPI: 66 y.o.femalewith past medical history of COPD on home oxygen, anxiety, hx of vaginal cancer,bedbound for the last 2+ yearswho was admitted on 5/23/2018with altered mental status and acute on chronic hypoxic respiratory failure. She was intubated and is being treated in the ICU. Blood cultures are positive for aerococcus bacteremia but thought to be a contaminate. Continues to have episodes of agitation on vent even with fentanyl, versed, and propofol.   Of note,  Mrs. Welborn, "Tiffany Mclean" has completed Advanced Directives 2x in the past 2 years. She was adamant that she did not want to be intubated or  have ACLS. Her paperwork is in EPIC   Assessment: I met today with daughter, Raquel Sarna, at bedside. Discussed their decision for extubation and discussed process. Raquel Sarna says her brothers are driving from [place] today but she is unsure what time they will arrive. If they arrive early enough to spend time family might be ready for extubation today. Therapeutic listening and emotional support provided.   I came back later that day and met with husband, [name], and sister at bedside. [name] is endorsing desire for comfort and stopping any interventions that could be causing her discomfort. He struggles with extubation and providing medication for comfort at that time and that this may not "be giving her a chance." We discussed her tolerance of medication and alertness on vent even with high doses of medication and that I do not believe this will change the outcome for her. He understands as his main goal is that she is comfortable and does not suffer - understands that she will need medication to achieve comfort. We also discussed expectations after extubation and signs at EOL. Questions/concerns addressed. Therapeutic listening and emotional support provided.   Recommendations/Plan:  Pain/dyspnea: Continue propofol gtt while intubation. Fentanyl infusion will be continued at extubation and increased fentanyl bolus 50-200 mcg every hour prn.   Anxiety: Continue versed infusion and increased bolus 2-4 mg every hour prn. Will continue this at time of extubation as well.   Goals of Care and Additional Recommendations:  Limitations on Scope of Treatment: Minimize Medications  Code Status:  DNR  Prognosis:   Prognosis very poor and likely hours to days after extubation.   Discharge Planning:  Likely hospital death.    Thank you for allowing the Palliative Medicine  Team to assist in the care of this patient.   Total Time 0900-0930, 1600-1730 174mn Prolonged Time Billed  yes       Greater than 50%  of this time was spent counseling and coordinating care related to the above assessment and plan.  AVinie Sill NP Palliative Medicine Team Pager # [phone](M-F 8a-5p) Team Phone # [phone](Nights/Weekends)

## 2016-12-17 NOTE — Progress Notes (Signed)
Pharmacy Antibiotic Note/Electrolyte Monitoring  Tiffany Mclean is a 66 y.o. female with a h/o oxygen-dependient COPD admitted on 12/11/2016 with acute respiratory failure.  Pharmacy has been consulted for meropenem dosing as well as electrolyte monitoring and replacement.   Plan is for terminal extubation 5/30.   Plan: 1. ABX:  After discussion with Dr. Ashby Dawes, will d/c vancomycin.   -Continue meropenem 1 g iv q 8 hours.   2.Electrolytes: No electrolytes ordered today. Will not order any further electrolytes with plans for terminal extubation tomorrow.   Height: 5\' 6"  (167.6 cm) Weight: 132 lb 0.9 oz (59.9 kg) IBW/kg (Calculated) : 59.3  Temp (24hrs), Avg:99 F (37.2 C), Min:97.9 F (36.6 C), Max:99.9 F (37.7 C)   Recent Labs Lab 12/11/16 2252 12/12/16 0303 12/12/16 0705 12/12/16 2126 12/13/16 0545 12/14/16 0245 12/15/16 0433 12/16/16 0429 12/16/16 2015  WBC 11.7* 10.2  --   --  18.2*  --   --   --   --   CREATININE  --  0.68  --   --  0.70 0.51 0.41* 0.43*  --   LATICACIDVEN 3.5* 5.1* 6.2* 1.1 1.1  --   --   --   --   VANCOTROUGH  --   --   --   --   --   --   --   --  17    Estimated Creatinine Clearance: 63.9 mL/min (A) (by C-G formula based on SCr of 0.43 mg/dL (L)).    BMP Latest Ref Rng & Units 12/16/2016 12/15/2016 12/14/2016  Glucose 65 - 99 mg/dL 146(H) 141(H) 118(H)  BUN 6 - 20 mg/dL 25(H) 28(H) 31(H)  Creatinine 0.44 - 1.00 mg/dL 0.43(L) 0.41(L) 0.51  BUN/Creat Ratio 12 - 28 - - -  Sodium 135 - 145 mmol/L 141 147(H) 142  Potassium 3.5 - 5.1 mmol/L 5.1 4.9 4.5  Chloride 101 - 111 mmol/L 100(L) 107 105  CO2 22 - 32 mmol/L 34(H) 35(H) 33(H)  Calcium 8.9 - 10.3 mg/dL 8.7(L) 8.5(L) 8.7(L)   Magnesium (mg/dL)  Date Value  12/13/2016 2.0  11/18/2014 1.6 (L)   Phosphorus (mg/dL)  Date Value  12/16/2016 3.2  11/18/2014 2.5   Albumin (g/dL)  Date Value  12/12/2016 3.2 (L)  06/21/2016 3.8  11/10/2014 2.9 (L)     Allergies  Allergen  Reactions  . Asa [Aspirin]   . Avelox [Moxifloxacin]   . Cephalexin   . Codeine   . Erythromycin Ethylsuccinate   . Flagyl [Metronidazole]   . Latex   . Levaquin [Levofloxacin] Hives  . Meloxicam   . Shellfish Allergy Nausea And Vomiting and Other (See Comments)    Dizzy, fainting  . Sulfa Antibiotics   . Topiramate   . Doxycycline Rash  . Tetracyclines & Related Rash    Antimicrobials this admission: meropenem 5/24 >>  vancomycin 5/24 >> 5/25 Vancomycin 5/27 >> 5/29  Dose adjustments this admission:   Microbiology results: 5/23 BCx: CNS, diphtheroids 1/2  5/23 UCx: NG  Sputum: sent  5/24 MRSA PCR: negative  Thank you for allowing pharmacy to be a part of this patient's care.  Ulice Dash D 12/17/2016 2:01 PM

## 2016-12-18 DIAGNOSIS — Z9911 Dependence on respirator [ventilator] status: Secondary | ICD-10-CM | POA: Diagnosis not present

## 2016-12-18 DIAGNOSIS — J441 Chronic obstructive pulmonary disease with (acute) exacerbation: Secondary | ICD-10-CM | POA: Diagnosis not present

## 2016-12-18 DIAGNOSIS — Z515 Encounter for palliative care: Secondary | ICD-10-CM | POA: Diagnosis not present

## 2016-12-18 DIAGNOSIS — Z7189 Other specified counseling: Secondary | ICD-10-CM | POA: Diagnosis not present

## 2016-12-18 MED ORDER — IPRATROPIUM-ALBUTEROL 0.5-2.5 (3) MG/3ML IN SOLN
3.0000 mL | RESPIRATORY_TRACT | Status: DC | PRN
  Administered 2016-12-18 – 2016-12-25 (×12): 3 mL via RESPIRATORY_TRACT
  Filled 2016-12-18 (×12): qty 3

## 2016-12-18 MED ORDER — FENTANYL BOLUS VIA INFUSION
50.0000 ug | INTRAVENOUS | Status: DC | PRN
  Administered 2016-12-18: 100 ug via INTRAVENOUS
  Administered 2016-12-18: 50 ug via INTRAVENOUS
  Administered 2016-12-18 – 2016-12-19 (×3): 100 ug via INTRAVENOUS
  Filled 2016-12-18: qty 200

## 2016-12-18 MED ORDER — GLYCOPYRROLATE 0.2 MG/ML IJ SOLN
0.4000 mg | Freq: Once | INTRAMUSCULAR | Status: AC
  Administered 2016-12-18: 0.4 mg via INTRAVENOUS
  Filled 2016-12-18: qty 2

## 2016-12-18 MED ORDER — POLYVINYL ALCOHOL 1.4 % OP SOLN
1.0000 [drp] | Freq: Four times a day (QID) | OPHTHALMIC | Status: DC
  Administered 2016-12-18: 1 [drp] via OPHTHALMIC
  Filled 2016-12-18: qty 15

## 2016-12-18 MED ORDER — GLYCOPYRROLATE 0.2 MG/ML IJ SOLN
0.2000 mg | INTRAMUSCULAR | Status: DC
  Administered 2016-12-18 – 2016-12-19 (×5): 0.2 mg via INTRAVENOUS
  Filled 2016-12-18 (×5): qty 1

## 2016-12-18 MED ORDER — MIDAZOLAM BOLUS VIA INFUSION
2.0000 mg | INTRAVENOUS | Status: DC | PRN
  Administered 2016-12-18 (×2): 2 mg via INTRAVENOUS
  Filled 2016-12-18: qty 4

## 2016-12-18 MED ORDER — POLYVINYL ALCOHOL 1.4 % OP SOLN
1.0000 [drp] | OPHTHALMIC | Status: DC
  Administered 2016-12-18 – 2016-12-23 (×26): 1 [drp] via OPHTHALMIC
  Filled 2016-12-18: qty 15

## 2016-12-18 MED ORDER — POLYVINYL ALCOHOL 1.4 % OP SOLN
1.0000 [drp] | OPHTHALMIC | Status: DC | PRN
  Filled 2016-12-18: qty 15

## 2016-12-18 MED ORDER — BIOTENE DRY MOUTH MT LIQD: 15.0000 mL | OROMUCOSAL | Status: DC | PRN

## 2016-12-18 NOTE — Progress Notes (Signed)
Pt. exubated and placed on 4 lnc,pallative care at bedside.

## 2016-12-18 NOTE — Progress Notes (Signed)
[place] responded to an OR for End of Life. Pt had recently been extubated and family was bedside. [place] provided ministry of presence, active listening, emotional support, EOL education, hospitality (cart of food present). [place] observed family was stable and is available as needed.

## 2016-12-18 NOTE — Progress Notes (Signed)
Daughter stated that her mother asked for her inhaler and daughter is asking for patient to have an inhaler.  RN made Vinie Sill, NP aware who gave order for PRN duonebs.

## 2016-12-18 NOTE — Progress Notes (Signed)
   12/18/16 1100  Clinical Encounter Type  Visited With Patient;Patient and family together;Health care provider  Visit Type Initial;Spiritual support;Critical Care;Patient actively dying  Referral From Nurse  Spiritual Encounters  Spiritual Needs Prayer;Emotional;Grief support  Stress Factors  Patient Stress Factors Health changes;Loss;Major life changes  Family Stress Factors Exhausted;Health changes;Loss;Major life changes

## 2016-12-18 NOTE — Progress Notes (Signed)
Daily Progress Note   Patient Name: Tiffany Mclean       Date: 12/18/2016 DOB: 23-Feb-1951  Age: 66 y.o. MRN#: [id number] Attending Physician: Epifanio Lesches, MD Primary Care Physician: Birdie Sons, MD Admit Date: 12/11/2016  Reason for Consultation/Follow-up: Establishing goals of care, Extended family meeting, terminal care  Subjective: Tiffany Mclean is resting comfortably this morning. Family gathering at bedside in preparation for one way extubation.   Length of Stay: 6  Current Medications: Scheduled Meds:  . budesonide (PULMICORT) nebulizer solution  0.25 mg Nebulization Q6H  . chlorhexidine gluconate (MEDLINE KIT)  15 mL Mouth Rinse BID  . free water  200 mL Per Tube Q8H  . glycopyrrolate  0.2 mg Intravenous Q4H  . ipratropium-albuterol  3 mL Nebulization Q6H  . mouth rinse  15 mL Mouth Rinse QID  . methylPREDNISolone (SOLU-MEDROL) injection  40 mg Intravenous Q12H  . pantoprazole (PROTONIX) IV  40 mg Intravenous Q24H    Continuous Infusions: . dextrose 10 mL/hr at 12/18/16 0840  . fentaNYL infusion INTRAVENOUS 350 mcg/hr (12/18/16 0153)  . meropenem (MERREM) IV Stopped (12/18/16 1700)  . midazolam (VERSED) infusion 4.5 mg/hr (12/18/16 0059)  . norepinephrine (LEVOPHED) Adult infusion 5 mcg/min (12/18/16 1749)  . propofol (DIPRIVAN) infusion 40 mcg/kg/min (12/18/16 0652)    PRN Meds: acetaminophen **OR** acetaminophen, albuterol, bisacodyl, bisacodyl, fentaNYL (SUBLIMAZE) injection, magnesium citrate, midazolam, [DISCONTINUED] ondansetron **OR** ondansetron (ZOFRAN) IV, senna-docusate, sennosides  Physical Exam  Constitutional: She appears well-developed and well-nourished. She is intubated.  Cardiovascular: Regular rhythm.  Bradycardia present.   Pulmonary/Chest:  No accessory muscle usage. No tachypnea and no bradypnea. She is intubated. No respiratory distress.  Nursing note and vitals reviewed.           Vital Signs: BP 107/68   Pulse (!) 49   Temp 97.9 F (36.6 C)   Resp 15   Ht 5' 6"  (1.676 m)   Wt 59.9 kg (132 lb 0.9 oz)   SpO2 99%   BMI 21.31 kg/m  SpO2: SpO2: 99 % O2 Device: O2 Device: Ventilator O2 Flow Rate: O2 Flow Rate (L/min): 5 L/min  Intake/output summary:   Intake/Output Summary (Last 24 hours) at 12/18/16 0855 Last data filed at 12/18/16 0800  Gross per 24 hour  Intake          4302.02  ml  Output             4450 ml  Net          -147.98 ml   LBM: Last BM Date: 12/11/16 Baseline Weight: Weight: 59.9 kg (132 lb) Most recent weight: Weight: 59.9 kg (132 lb 0.9 oz)       Palliative Assessment/Data:    Flowsheet Rows     Most Recent Value  Intake Tab  Referral Department  Critical care  Unit at Time of Referral  ICU  Palliative Care Primary Diagnosis  Pulmonary  Date Notified  12/12/16  Palliative Care Type  New Palliative care  Reason for referral  Clarify Goals of Care, Counsel Regarding Hospice, Non-pain Symptom  Date of Admission  12/12/16  Date first seen by Palliative Care  12/13/16  # of days Palliative referral response time  1 Day(s)  # of days IP prior to Palliative referral  0  Clinical Assessment  Palliative Performance Scale Score  20%  Anxiety Max Last 24 Hours  8  Anxiety Min Last 24 Hours  4  Psychosocial & Spiritual Assessment  Palliative Care Outcomes  Patient/Family meeting held?  Yes  Who was at the meeting?  patient and husband [name]  Palliative Care Outcomes  Improved non-pain symptom therapy, Clarified goals of care, Counseled regarding hospice      Patient Active Problem List   Diagnosis Date Noted  . Ventilator dependence ([place])   . Acute metabolic encephalopathy   . Goals of care, counseling/discussion   . Palliative care encounter   . Cancer ([place])   . COPD (chronic  obstructive pulmonary disease) ([place])   . Diabetes mellitus without complication ([place])   . Severe sepsis ([place])   . MRSA bacteremia   . Respiratory failure ([place]) 12/12/2016  . Dysphagia 04/21/2016  . COPD exacerbation ([place]) 04/17/2016  . Tremor 06/13/2015  . Depression 05/15/2015  . Anxiety 02/13/2015  . Dermatitis, eczematoid 02/13/2015  . Hypokalemia 02/13/2015  . Lower extremity weakness 02/13/2015  . Leukocytosis 02/13/2015  . Magnesium deficiency 02/13/2015  . Symptomatic menopausal or female climacteric states 02/13/2015  . Panic disorder 02/13/2015  . Bed sore on buttock 02/08/2015  . COPD with emphysema ([place]) 11/28/2014  . Chronic respiratory failure with hypoxia ([place]) 11/28/2014  . GERD (gastroesophageal reflux disease) 11/28/2014  . Anemia of chronic disease 11/28/2014  . Pneumonia 11/23/2014  . Generalized anxiety disorder 11/23/2014  . Injury of kidney 10/31/2014  . Personal history of perinatal problems 10/24/2014  . Chronic pain associated with significant psychosocial dysfunction 02/21/2010  . Anemia, iron deficiency 01/29/2010  . Allergic rhinitis 12/01/2009  . DDD (degenerative disc disease), cervical 12/01/2009  . Major depression 06/30/2009  . Abdominal pain, generalized 03/21/2009  . Vitamin D deficiency 07/22/2008  . Hypercholesterolemia without hypertriglyceridemia 03/11/2007  . Essential and other specified forms of tremor 07/22/1998    Palliative Care Assessment & Plan   HPI: 66 y.o.femalewith past medical history of COPD on home oxygen, anxiety, hx of vaginal cancer,bedbound for the last 2+ yearswho was admitted on 5/23/2018with altered mental status and acute on chronic hypoxic respiratory failure. She was intubated and is being treated in the ICU. Blood cultures are positive for aerococcus bacteremia but thought to be a contaminate. Continues to have episodes of agitation on vent even with fentanyl, versed, and propofol.   Of note, Mrs.  Tiffany Mclean, "Tiffany Mclean" has completed Advanced Directives 2x in the past 2 years. She was adamant that she did not want  to be intubated or have ACLS. Her paperwork is in EPIC   Assessment: I met this morning again with [name] and Raquel Sarna and questions answered regarding preparation of extubation planned for 1000 am today. Discussed medications for comfort (family needs much reassurance with this) as well as expectations after extubation and signs at EOL including what are signs of discomfort vs within the expectations of EOL (i.e. changes in breathing pattern). All question answered and updated RN, RT, and Dr. Ashby Dawes to plan.   I came back later and Tiffany Mclean was extubated with multiple family members at bedside. She remains on fentanyl and versed infusion. She does not appear completely comfortable but is able to communicate a little with family (able to call out for her brother and voice 1-2 words with great effort). Family happy with her alertness and therefor resistant to more medication for comfort. Struggling with goal for comfort vs "giving her a chance." Multiple questions and concerns from family and much reassurance and emotional support needed. I remained at bedside to help with these concerns.   Recommendations/Plan:  Pain/dyspnea: Fentanyl infusion @ 200 mcg/hr with 50-200 mcg bolus every 30 min prn.   Anxiety: Versed infusion @ 2 mg/hr and bolus 2-4 mg every 30 min prn.   Goals of Care and Additional Recommendations:  Limitations on Scope of Treatment: Minimize Medications  Code Status:  DNR  Prognosis:   Prognosis very poor and likely hours to days after extubation.   Discharge Planning:  Likely hospital death.    Thank you for allowing the Palliative Medicine Team to assist in the care of this patient.   Total Time 0800-0830, 1000-1200,  1400-1430  179mn Prolonged Time Billed  yes       Greater than 50%  of this time was spent counseling and coordinating care related to  the above assessment and plan.  AVinie Sill NP Palliative Medicine Team Pager # [phone](M-F 8a-5p) Team Phone # [phone](Nights/Weekends)

## 2016-12-18 NOTE — Progress Notes (Signed)
Patient becoming more coherent during shift but confused conversations.  Family at bedside throughout shift.  Patient intermittently has labored breathing but family does not want patient to have anymore medication to help with shortness of breath at this time.  Report given to [facility], RN who is now taking over patient's care.

## 2016-12-18 NOTE — Progress Notes (Signed)
Discussed with palliative care, and ICU RN. Plan for terminal wean from ventilator this morning. DO NOT RESUSCITATE. No further recommendations.  Marda Stalker, M.D.  12/18/2016

## 2016-12-18 NOTE — Progress Notes (Signed)
Extubated at 1038 by Joanne Chars, RRT.  This RN and Palliative, NP at bedside.  Versed and fentanyl drips infusing for comfort, pain and shortness of breath.  Will continue to monitor.  Chaplain coming to be present with family.

## 2016-12-19 DIAGNOSIS — J441 Chronic obstructive pulmonary disease with (acute) exacerbation: Secondary | ICD-10-CM | POA: Diagnosis not present

## 2016-12-19 DIAGNOSIS — G9341 Metabolic encephalopathy: Secondary | ICD-10-CM | POA: Diagnosis not present

## 2016-12-19 DIAGNOSIS — Z7189 Other specified counseling: Secondary | ICD-10-CM | POA: Diagnosis not present

## 2016-12-19 DIAGNOSIS — Z515 Encounter for palliative care: Secondary | ICD-10-CM | POA: Diagnosis not present

## 2016-12-19 MED ORDER — METHYLPREDNISOLONE SODIUM SUCC 40 MG IJ SOLR
40.0000 mg | Freq: Two times a day (BID) | INTRAMUSCULAR | Status: DC
  Administered 2016-12-19 – 2016-12-23 (×9): 40 mg via INTRAVENOUS
  Filled 2016-12-19 (×9): qty 1

## 2016-12-19 MED ORDER — SODIUM CHLORIDE 0.9 % IV SOLN: 10.0000 mg/h | INTRAVENOUS | Status: DC

## 2016-12-19 MED ORDER — MIDAZOLAM BOLUS VIA INFUSION: 1.0000 mg | INTRAVENOUS | Status: DC | PRN

## 2016-12-19 MED ORDER — MORPHINE BOLUS VIA INFUSION
4.0000 mg | INTRAVENOUS | Status: DC | PRN
  Administered 2016-12-19 – 2016-12-25 (×13): 4 mg via INTRAVENOUS
  Filled 2016-12-19: qty 4

## 2016-12-19 MED ORDER — LORAZEPAM 2 MG/ML IJ SOLN
0.5000 mg | Freq: Three times a day (TID) | INTRAMUSCULAR | Status: DC | PRN
  Administered 2016-12-19 – 2016-12-20 (×2): 0.5 mg via INTRAVENOUS
  Filled 2016-12-19 (×2): qty 1

## 2016-12-19 MED ORDER — ORAL CARE MOUTH RINSE: 15.0000 mL | Freq: Four times a day (QID) | OROMUCOSAL | Status: DC

## 2016-12-19 MED ORDER — MORPHINE 100MG IN NS 100ML (1MG/ML) PREMIX INFUSION
6.0000 mg/h | INTRAVENOUS | Status: DC
  Administered 2016-12-19 – 2016-12-21 (×5): 10 mg/h via INTRAVENOUS
  Administered 2016-12-22 (×2): 8 mg/h via INTRAVENOUS
  Administered 2016-12-23: 6 mg/h via INTRAVENOUS
  Administered 2016-12-23: 06:00:00 8 mg/h via INTRAVENOUS
  Administered 2016-12-23: 21:00:00 6 mg/h via INTRAVENOUS
  Filled 2016-12-19 (×11): qty 100

## 2016-12-19 MED ORDER — CHLORHEXIDINE GLUCONATE 0.12% ORAL RINSE (MEDLINE KIT): 15.0000 mL | Freq: Two times a day (BID) | OROMUCOSAL | Status: DC

## 2016-12-19 MED ORDER — GLYCOPYRROLATE 0.2 MG/ML IJ SOLN
0.2000 mg | INTRAMUSCULAR | Status: DC | PRN
  Administered 2016-12-20 – 2016-12-21 (×5): 0.2 mg via INTRAVENOUS
  Filled 2016-12-19 (×6): qty 1

## 2016-12-19 MED ORDER — MIDAZOLAM HCL 2 MG/2ML IJ SOLN: 1.0000 mg | INTRAMUSCULAR | Status: DC | PRN

## 2016-12-19 MED ORDER — FENTANYL BOLUS VIA INFUSION
50.0000 ug | INTRAVENOUS | Status: DC | PRN
  Filled 2016-12-19: qty 200

## 2016-12-19 NOTE — Progress Notes (Signed)
Called by nursing staff to come and talk with the family. Went into room where I observed the family debating her care and voicing feelings of powerlessness and frustrations regarding mixed messages with care. I encouraged the husband to leave and talk with me. He declined... explaining he didn't want to leave her side. Patient's son,Tiffany Mclean. introduced himself explaining that he arrived from [place] and what the family concerns were. He also voiced concerns to the rest of the family about them yelling at each other and nursing staff and not working together to do what is best for his mother. Family responded to him. Daughter, husband and son all saying that they want to honor their mothers wishes about DNR and no ACLS yet they are not sure why other orders were discontinued for example steroids, and antibiotics.  I talked with them about how comfort care typically works. The husband states, "But she is awake and talking to [place]. If these things buy [place] some time with her then we want them."  Husband assures me that he wants to follow her wishes as noted in her advance care directives. Pt. fighting sleep describing that she is scared to go to sleep right now out of fear of not waking up.  I talked with Dr. Jannifer Franklin about their concerns and request. Talked with nurse [place] Miners about orders and followed up with family for clarification. Family is requesting to speak with palliative care again tomorrow to clarify.

## 2016-12-19 NOTE — Progress Notes (Signed)
[place] at [facility]                                                                                                                                                                                  Patient Demographics   Tiffany Mclean, is a 66 y.o. female, DOB - January 09, 1951, RCV:[id number]  Admit date - 12/11/2016   Admitting Physician Harvie Bridge, DO  Outpatient Primary MD for the patient is Fisher, Kirstie Peri, MD   LOS - 7  Subjective: Patient extubated yesterday. On morphine drip, able to have a conversation with family.  Review of Systems:   CONSTITUTIONAL: Intubated  Vitals:   Vitals:   12/19/16 0504 12/19/16 0800 12/19/16 0855 12/19/16 0900  BP: 137/64  (!) 147/61   Pulse: (!) 118 (!) 111 95 90  Resp: 10 (!) 23 10 18   Temp: 99.7 F (37.6 C) 99.7 F (37.6 C) 99.5 F (37.5 C)   TempSrc: Core (Comment)     SpO2: 93% 97%  94%  Weight:      Height:        Wt Readings from Last 3 Encounters:  12/12/16 59.9 kg (132 lb 0.9 oz)  05/30/16 59.9 kg (132 lb)  04/21/16 61.7 kg (136 lb)     Intake/Output Summary (Last 24 hours) at 12/19/16 1301 Last data filed at 12/19/16 1000  Gross per 24 hour  Intake           509.56 ml  Output             5450 ml  Net         -4940.44 ml      Antibiotics   Anti-infectives    Start     Dose/Rate Route Frequency Ordered Stop   12/15/16 0900  vancomycin (VANCOCIN) IVPB 1000 mg/200 mL premix  Status:  Discontinued     1,000 mg 200 mL/hr over 60 Minutes Intravenous Every 12 hours 12/15/16 0832 12/17/16 1127   12/13/16 0400  vancomycin (VANCOCIN) IVPB 1000 mg/200 mL premix  Status:  Discontinued     1,000 mg 200 mL/hr over 60 Minutes Intravenous Every 12 hours 12/12/16 1922 12/13/16 1110   12/12/16 2000  vancomycin (VANCOCIN) IVPB 1000 mg/200 mL premix     1,000 mg 200 mL/hr over 60 Minutes Intravenous  Once 12/12/16 1921 12/12/16 2108   12/12/16 1000  vancomycin (VANCOCIN) IVPB 1000 mg/200 mL  premix  Status:  Discontinued     1,000 mg 200 mL/hr over 60 Minutes Intravenous Every 12 hours 12/12/16 0330 12/12/16 1219   12/12/16 0115  vancomycin (VANCOCIN) IVPB 1000 mg/200 mL premix     1,000 mg 200  mL/hr over 60 Minutes Intravenous  Once 12/12/16 0110 12/12/16 0336   12/11/16 2338  meropenem (MERREM) 500 MG/50ML IVPB SOLR    Comments:  David, Raquel: cabinet override      12/11/16 2338 12/12/16 1144   12/11/16 2330  meropenem (MERREM) 1 g in sodium chloride 0.9 % 100 mL IVPB  Status:  Discontinued     1 g 200 mL/hr over 30 Minutes Intravenous Every 8 hours 12/11/16 2316 12/19/16 0957      Medications   Scheduled Meds: . chlorhexidine gluconate (MEDLINE KIT)  15 mL Mouth Rinse BID  . mouth rinse  15 mL Mouth Rinse QID  . polyvinyl alcohol  1 drop Both Eyes Q2H   Continuous Infusions: . dextrose 10 mL/hr at 12/18/16 0840  . fentaNYL infusion INTRAVENOUS 250 mcg/hr (12/19/16 0558)  . morphine     PRN Meds:.acetaminophen **OR** acetaminophen, antiseptic oral rinse, bisacodyl, fentaNYL, glycopyrrolate, ipratropium-albuterol, midazolam, morphine, [DISCONTINUED] ondansetron **OR** ondansetron (ZOFRAN) IV, polyvinyl alcohol   Data Review:   Micro Results Recent Results (from the past 240 hour(s))  Blood Culture (routine x 2)     Status: Abnormal   Collection Time: 12/11/16 10:52 PM  Result Value Ref Range Status   Specimen Description BLOOD RT HAND  Final   Special Requests   Final    BOTTLES DRAWN AEROBIC AND ANAEROBIC Blood Culture adequate volume   Culture  Setup Time   Final    GRAM POSITIVE COCCI GRAM POSITIVE RODS IN BOTH AEROBIC AND ANAEROBIC BOTTLES CRITICAL RESULT CALLED TO, READ BACK BY AND VERIFIED WITH: JASON ROBBINS AT 1808 12/12/2016 BY TFK. JASON ROBBINS AT 2120 ON 12/12/2016 JJB    Culture (A)  Final    STAPHYLOCOCCUS SPECIES (COAGULASE NEGATIVE) THE SIGNIFICANCE OF ISOLATING THIS ORGANISM FROM A SINGLE SET OF BLOOD CULTURES WHEN MULTIPLE SETS ARE  DRAWN IS UNCERTAIN. PLEASE NOTIFY THE MICROBIOLOGY DEPARTMENT WITHIN ONE WEEK IF SPECIATION AND SENSITIVITIES ARE REQUIRED. DIPHTHEROIDS(CORYNEBACTERIUM SPECIES) Standardized susceptibility testing for this organism is not available. Performed at [facility], [place] [address], [place], [place] [address]    Report Status 12/14/2016 FINAL  Final  Blood Culture (routine x 2)     Status: None   Collection Time: 12/11/16 10:52 PM  Result Value Ref Range Status   Specimen Description BLOOD L FA  Final   Special Requests BOTTLES DRAWN AEROBIC AND ANAEROBIC [place]  Final   Culture NO GROWTH 5 DAYS  Final   Report Status 12/16/2016 FINAL  Final  Urine culture     Status: None   Collection Time: 12/11/16 10:52 PM  Result Value Ref Range Status   Specimen Description URINE, RANDOM  Final   Special Requests NONE  Final   Culture   Final    NO GROWTH Performed at [facility], [place] [address], [place],  [address]    Report Status 12/13/2016 FINAL  Final  Blood Culture ID Panel (Reflexed)     Status: Abnormal   Collection Time: 12/11/16 10:52 PM  Result Value Ref Range Status   Enterococcus species NOT DETECTED NOT DETECTED Final   Listeria monocytogenes NOT DETECTED NOT DETECTED Final   Staphylococcus species DETECTED (A) NOT DETECTED Final    Comment: Methicillin (oxacillin) resistant coagulase negative staphylococcus. Possible blood culture contaminant (unless isolated from more than one blood culture draw or clinical case suggests pathogenicity). No antibiotic treatment is indicated for blood  culture contaminants. CRITICAL RESULT CALLED TO, READ BACK BY AND VERIFIED WITH:  JASON ROBBINS AT 9024 12/12/2016 BY TFK.    Staphylococcus aureus NOT DETECTED NOT DETECTED Final   Methicillin resistance DETECTED (A) NOT DETECTED Final    Comment: CRITICAL RESULT CALLED TO, READ BACK BY AND VERIFIED WITH: JASON ROBBINS AT 0973 12/12/2016 BY TFK.    Streptococcus species NOT DETECTED  NOT DETECTED Final   Streptococcus agalactiae NOT DETECTED NOT DETECTED Final   Streptococcus pneumoniae NOT DETECTED NOT DETECTED Final   Streptococcus pyogenes NOT DETECTED NOT DETECTED Final   Acinetobacter baumannii NOT DETECTED NOT DETECTED Final   Enterobacteriaceae species NOT DETECTED NOT DETECTED Final   Enterobacter cloacae complex NOT DETECTED NOT DETECTED Final   Escherichia coli NOT DETECTED NOT DETECTED Final   Klebsiella oxytoca NOT DETECTED NOT DETECTED Final   Klebsiella pneumoniae NOT DETECTED NOT DETECTED Final   Proteus species NOT DETECTED NOT DETECTED Final   Serratia marcescens NOT DETECTED NOT DETECTED Final   Haemophilus influenzae NOT DETECTED NOT DETECTED Final   Neisseria meningitidis NOT DETECTED NOT DETECTED Final   Pseudomonas aeruginosa NOT DETECTED NOT DETECTED Final   Candida albicans NOT DETECTED NOT DETECTED Final   Candida glabrata NOT DETECTED NOT DETECTED Final   Candida krusei NOT DETECTED NOT DETECTED Final   Candida parapsilosis NOT DETECTED NOT DETECTED Final   Candida tropicalis NOT DETECTED NOT DETECTED Final  MRSA PCR Screening     Status: None   Collection Time: 12/12/16  2:17 AM  Result Value Ref Range Status   MRSA by PCR NEGATIVE NEGATIVE Final    Comment:        The GeneXpert MRSA Assay (FDA approved for NASAL specimens only), is one component of a comprehensive MRSA colonization surveillance program. It is not intended to diagnose MRSA infection nor to guide or monitor treatment for MRSA infections.   CULTURE, BLOOD (ROUTINE X 2) w Reflex to ID Panel     Status: None (Preliminary result)   Collection Time: 12/15/16  8:19 AM  Result Value Ref Range Status   Specimen Description BLOOD RIGHT WRIST  Final   Special Requests   Final    BOTTLES DRAWN AEROBIC AND ANAEROBIC Blood Culture adequate volume   Culture NO GROWTH 4 DAYS  Final   Report Status PENDING  Incomplete  CULTURE, BLOOD (ROUTINE X 2) w Reflex to ID Panel      Status: Abnormal (Preliminary result)   Collection Time: 12/15/16  8:39 AM  Result Value Ref Range Status   Specimen Description BLOOD LEFT HAND  Final   Special Requests AEROCOCCUS SPECIES Blood Culture adequate volume (A)  Final   Culture NO GROWTH 4 DAYS  Final   Report Status PENDING  Incomplete  Culture, respiratory (NON-Expectorated)     Status: None (Preliminary result)   Collection Time: 12/15/16  8:40 AM  Result Value Ref Range Status   Specimen Description TRACHEAL ASPIRATE  Final   Special Requests NONE  Final   Gram Stain   Final    RARE WBC PRESENT,BOTH PMN AND MONONUCLEAR FEW GRAM POSITIVE COCCI    Culture   Final    MODERATE STAPHYLOCOCCUS EPIDERMIDIS Sent to [place] for further susceptibility testing. Performed at [facility], [place] [address], [place], [place] [address]    Report Status PENDING  Incomplete    Radiology Reports Dg Abd 1 View  Result Date: 12/12/2016 CLINICAL DATA:  NG tube placement EXAM: ABDOMEN - 1 VIEW COMPARISON:  None. FINDINGS: Enteric tube tip is in the left upper quadrant consistent  with location in the upper stomach. Emphysematous changes suggested in the lung bases. IMPRESSION: Enteric tube tip is in the left upper quadrant consistent with location in the upper stomach. Electronically Signed   By: Lucienne Capers M.D.   On: 12/12/2016 03:57   Ct Angio Chest Pe W Or Wo Contrast  Result Date: 12/12/2016 CLINICAL DATA:  Acute onset altered mental status. Confusion. Hypoxia. Initial encounter. EXAM: CT ANGIOGRAPHY CHEST WITH CONTRAST TECHNIQUE: Multidetector CT imaging of the chest was performed using the standard protocol during bolus administration of intravenous contrast. Multiplanar CT image reconstructions and MIPs were obtained to evaluate the vascular anatomy. CONTRAST:  75 mL of Isovue 370 IV contrast COMPARISON:  Chest radiograph performed earlier today at 12:11 a.m., and CTA of the chest performed 08/25/2015 FINDINGS:  Cardiovascular:  There is no evidence of pulmonary embolus. The heart is normal in size. The thoracic aorta is grossly unremarkable. The great vessels are within normal limits. Mediastinum/Nodes: The mediastinum is unremarkable in appearance. No mediastinal lymphadenopathy is seen. No pericardial effusion is identified. The patient's endotracheal tube is seen ending 3 cm above the carina. The thyroid gland is unremarkable. No axillary lymphadenopathy is appreciated. Lungs/Pleura: Bilateral emphysema is noted. Minimal left basilar atelectasis or scarring is noted. No pleural effusion or pneumothorax is seen. No masses are identified. Upper Abdomen: The visualized portions of the liver and spleen are grossly unremarkable. Musculoskeletal: No acute osseous abnormalities are identified. The visualized musculature is unremarkable in appearance. There is suggestion of chronic disruption of the patient left breast implant. Review of the MIP images confirms the above findings. IMPRESSION: 1. No evidence of pulmonary embolus. 2. Bilateral emphysema noted. Minimal left basilar atelectasis or scarring noted. Electronically Signed   By: Garald Balding M.D.   On: 12/12/2016 02:20   Dg Chest Port 1 View  Result Date: 12/15/2016 CLINICAL DATA:  Acute on chronic respiratory failure. EXAM: PORTABLE CHEST 1 VIEW COMPARISON:  Radiographs of Dec 13, 2016. FINDINGS: The heart size and mediastinal contours are within normal limits. Endotracheal and nasogastric tubes are unchanged in position. No pneumothorax or pleural effusion is noted. Emphysematous disease is noted in the upper lobes bilaterally. Hyperexpansion of the lungs is noted. Both lungs are clear. The visualized skeletal structures are unremarkable. IMPRESSION: Findings consistent with chronic obstructive pulmonary disease. Stable support apparatus. No acute abnormality seen. Electronically Signed   By: Marijo Conception, M.D.   On: 12/15/2016 08:55   Dg Chest Port 1  View  Result Date: 12/13/2016 CLINICAL DATA:  Respiratory failure. EXAM: PORTABLE CHEST 1 VIEW COMPARISON:  CT 12/12/2016. Chest x-ray 12/12/2016, 12/11/2016, 04/17/2016 FINDINGS: Endotracheal tube, NG tube in stable position. Heart size normal. Increased density noted over the lungs most likely related to overlying breast implants. Mild bilateral pleural-parenchymal thickening noted most likely related scarring. COPD . No pleural effusion or pneumothorax. Prior cervical spine fusion . IMPRESSION: 1.  Lines and tubes in stable position. 2. Pleuroparenchymal thickening consistent scarring. COPD. Similar findings noted on prior exam . No acute abnormality. Electronically Signed   By: [name]  Register   On: 12/13/2016 07:06   Dg Chest Portable 1 View  Result Date: 12/12/2016 CLINICAL DATA:  Post intubation EXAM: PORTABLE CHEST 1 VIEW COMPARISON:  12/11/2016 FINDINGS: Interval placement of an endotracheal tube with tip measuring 5.3 cm above the carina. Heart size and pulmonary vascularity are normal. Emphysematous changes in the lungs with scattered fibrosis. No focal consolidation or airspace disease. No blunting of costophrenic angles. No pneumothorax.  Postoperative changes in the cervical spine. IMPRESSION: Endotracheal tube tip measures 5.3 cm above the carina, in good apparent location. Electronically Signed   By: Lucienne Capers M.D.   On: 12/12/2016 00:39   Dg Chest Port 1 View  Result Date: 12/12/2016 CLINICAL DATA:  Altered mental status EXAM: PORTABLE CHEST 1 VIEW COMPARISON:  04/17/2016 FINDINGS: Cervical spine hardware. Hyperinflation with emphysematous disease and bullous change in the right greater than left upper lobes. No acute consolidation or effusion. Stable cardiomediastinal silhouette. No pneumothorax. IMPRESSION: Hyperinflation with emphysematous disease. No acute infiltrate or edema Electronically Signed   By: Donavan Foil M.D.   On: 12/12/2016 00:11     CBC  Recent Labs Lab  12/13/16 0545  WBC 18.2*  HGB 8.3*  HCT 26.7*  PLT 172  MCV 88.0  MCH 27.2  MCHC 30.9*  RDW 13.9    Chemistries   Recent Labs Lab 12/13/16 0545 12/14/16 0245 12/15/16 0433 12/16/16 0429  NA 141 142 147* 141  K 4.1 4.5 4.9 5.1  CL 104 105 107 100*  CO2 34* 33* 35* 34*  GLUCOSE 164* 118* 141* 146*  BUN 36* 31* 28* 25*  CREATININE 0.70 0.51 0.41* 0.43*  CALCIUM 8.6* 8.7* 8.5* 8.7*  MG 2.0  --   --   --    ------------------------------------------------------------------------------------------------------------------ estimated creatinine clearance is 63.9 mL/min (A) (by C-G formula based on SCr of 0.43 mg/dL (L)). ------------------------------------------------------------------------------------------------------------------ No results for input(s): HGBA1C in the last 72 hours. ------------------------------------------------------------------------------------------------------------------ No results for input(s): CHOL, HDL, LDLCALC, TRIG, CHOLHDL, LDLDIRECT in the last 72 hours. ------------------------------------------------------------------------------------------------------------------ No results for input(s): TSH, T4TOTAL, T3FREE, THYROIDAB in the last 72 hours.  Invalid input(s): FREET3 ------------------------------------------------------------------------------------------------------------------ No results for input(s): VITAMINB12, FOLATE, FERRITIN, TIBC, IRON, RETICCTPCT in the last 72 hours.  Coagulation profile No results for input(s): INR, PROTIME in the last 168 hours.  No results for input(s): DDIMER in the last 72 hours.  Cardiac Enzymes No results for input(s): CKMB, TROPONINI, MYOGLOBIN in the last 168 hours.  Invalid input(s): CK ------------------------------------------------------------------------------------------------------------------ Invalid input(s): [place]   This is a 66 y.o. 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 Acute respiratory failure with hypoxia and hypercapnia, in a patient with home-O2 dependent COPD.   CT scan of the chest shows no pulmonary embolism And had extubation yesterday and  She is on comfort measures. #. Sepsis, with positive blood cultures due to coag-negative staph continue anabiotic, blood culture showed contamination Was on antibiotics, now on comfort care, family does not want blood work but wants IV hydration, patient will be moved to 1 cc and if she does well plan to see if she can go home with morphine drip and home hospice, discuss with palliative care nurse.   #. History of fibromyalgia/depression -On morphine drip as per patient's wishes. Husband appears to be still somewhat concerned about patient's status, not sure if he is completely into comfort care. Appreciate palliative care following the patient . Patient and family are agreed for comfort care and extubation but now they want IV hydration. Continue morphine drip, IV hydration but no blood work.  #. History of hyperlipidemia - hold Lipitor  #. History of GERD - Updated husband on the condition and status Prognosis really poor.     Code Status Orders        Start  Ordered   12/12/16 1202  Do not attempt resuscitation (DNR)  Continuous    Question Answer Comment  In the event of cardiac or respiratory ARREST Do not call a "code blue"   In the event of cardiac or respiratory ARREST Do not perform Intubation, CPR, defibrillation or ACLS   In the event of cardiac or respiratory ARREST Use medication by any route, position, wound care, and other measures to relive pain and suffering. May use oxygen, suction and manual treatment of airway obstruction as needed for comfort.      12/12/16 1219    Code Status History    Date Active Date Inactive Code  Status Order ID Comments User Context   12/12/2016  2:18 AM 12/12/2016 12:19 PM Full Code [id number]  HugelmeyerUbaldo Glassing, DO Inpatient   04/17/2016 11:13 PM 04/21/2016  6:10 PM Full Code [id number]  Holley Raring, NP ED    Advance Directive Documentation     Most Recent Value  Type of Advance Directive  Living will  Pre-existing out of facility DNR order (yellow form or pink MOST form)  -  "MOST" Form in Place?  -           Consults  Intensivists DVT Prophylaxis  Lovenox   Lab Results  Component Value Date   PLT 172 12/13/2016     Time Spent in minutes 48mn spent  Greater than 50% of time spent in care coordination and counseling patient regarding the condition and plan of care.   KEpifanio LeschesM.D on 12/19/2016 at 1:01 PM  Between 7am to 6pm - Pager - [phone]  After 6pm go to [email or web address] - password [email or web address] [email or web address]   Office  [phone]

## 2016-12-19 NOTE — Progress Notes (Signed)
RN referral, follow-up from visit. Tiffany Mclean (husband) was leaving room. [facility] offered emotional support and services when needed. [name] reports good memories from today and family may need emotional support as active dying progresses. [facility] is available if needed.     12/19/16 1700  Clinical Encounter Type  Visited With Family  Visit Type Follow-up;Patient actively dying  Referral From Nurse  Spiritual Encounters  Spiritual Needs Grief support

## 2016-12-19 NOTE — Progress Notes (Signed)
Report called to [facility] RN on 1C

## 2016-12-19 NOTE — Progress Notes (Signed)
Daily Progress Note   Patient Name: Tiffany Mclean       Date: 12/19/2016 DOB: March 17, 1951  Age: 66 y.o. MRN#: [id number] Attending Physician: Epifanio Lesches, MD Primary Care Physician: Birdie Sons, MD Admit Date: 12/11/2016  Reason for Consultation/Follow-up: Establishing goals of care, Extended family meeting, terminal care  Subjective: Tiffany Mclean continues to be alert and interactive, family at bedside.   Length of Stay: 7  Current Medications: Scheduled Meds:  . chlorhexidine gluconate (MEDLINE KIT)  15 mL Mouth Rinse BID  . mouth rinse  15 mL Mouth Rinse QID  . polyvinyl alcohol  1 drop Both Eyes Q2H    Continuous Infusions: . dextrose 10 mL/hr at 12/18/16 0840  . fentaNYL infusion INTRAVENOUS 250 mcg/hr (12/19/16 0558)    PRN Meds: acetaminophen **OR** acetaminophen, antiseptic oral rinse, bisacodyl, fentaNYL, glycopyrrolate, ipratropium-albuterol, midazolam, [DISCONTINUED] ondansetron **OR** ondansetron (ZOFRAN) IV, polyvinyl alcohol  Physical Exam  Constitutional: She appears well-developed and well-nourished.  Cardiovascular: Normal rate and regular rhythm.   Pulmonary/Chest: No accessory muscle usage. No tachypnea and no bradypnea. No respiratory distress. She has decreased breath sounds. She has no wheezes. She has no rhonchi. She has no rales.  Abdominal: Soft. Normal appearance. She exhibits no distension and no ascites.  Neurological: She is alert.  Somewhat oriented. Asking to go home. Answers questions more consistently today than yesterday.  Still appears restless/uncomfortable but says she is comfortable.   Nursing note and vitals reviewed.           Vital Signs: BP (!) 147/61   Pulse 90   Temp 99.5 F (37.5 C)   Resp 18   Ht 5' 6" (1.676 m)   Wt  59.9 kg (132 lb 0.9 oz)   SpO2 94%   BMI 21.31 kg/m  SpO2: SpO2: 94 % O2 Device: O2 Device: Nasal Cannula O2 Flow Rate: O2 Flow Rate (L/min): 2 L/min  Intake/output summary:   Intake/Output Summary (Last 24 hours) at 12/19/16 1044 Last data filed at 12/19/16 1000  Gross per 24 hour  Intake           585.23 ml  Output             5450 ml  Net         -4864.77 ml   LBM: Last BM Date: 12/11/16  Baseline Weight: Weight: 59.9 kg (132 lb) Most recent weight: Weight: 59.9 kg (132 lb 0.9 oz)       Palliative Assessment/Data:    Flowsheet Rows     Most Recent Value  Intake Tab  Referral Department  Critical care  Unit at Time of Referral  ICU  Palliative Care Primary Diagnosis  Pulmonary  Date Notified  12/12/16  Palliative Care Type  New Palliative care  Reason for referral  Clarify Goals of Care, Counsel Regarding Hospice, Non-pain Symptom  Date of Admission  12/12/16  Date first seen by Palliative Care  12/13/16  # of days Palliative referral response time  1 Day(s)  # of days IP prior to Palliative referral  0  Clinical Assessment  Palliative Performance Scale Score  20%  Anxiety Max Last 24 Hours  8  Anxiety Min Last 24 Hours  4  Psychosocial & Spiritual Assessment  Palliative Care Outcomes  Patient/Family meeting held?  Yes  Who was at the meeting?  patient and husband [name]  Palliative Care Outcomes  Improved non-pain symptom therapy, Clarified goals of care, Counseled regarding hospice      Patient Active Problem List   Diagnosis Date Noted  . Ventilator dependence ([place])   . Acute metabolic encephalopathy   . Goals of care, counseling/discussion   . Palliative care encounter   . Cancer ([place])   . COPD (chronic obstructive pulmonary disease) ([place])   . Diabetes mellitus without complication ([place])   . Severe sepsis ([place])   . MRSA bacteremia   . Respiratory failure ([place]) 12/12/2016  . Dysphagia 04/21/2016  . COPD exacerbation ([place]) 04/17/2016  . Tremor  06/13/2015  . Depression 05/15/2015  . Anxiety 02/13/2015  . Dermatitis, eczematoid 02/13/2015  . Hypokalemia 02/13/2015  . Lower extremity weakness 02/13/2015  . Leukocytosis 02/13/2015  . Magnesium deficiency 02/13/2015  . Symptomatic menopausal or female climacteric states 02/13/2015  . Panic disorder 02/13/2015  . Bed sore on buttock 02/08/2015  . COPD with emphysema ([place]) 11/28/2014  . Chronic respiratory failure with hypoxia ([place]) 11/28/2014  . GERD (gastroesophageal reflux disease) 11/28/2014  . Anemia of chronic disease 11/28/2014  . Pneumonia 11/23/2014  . Generalized anxiety disorder 11/23/2014  . Injury of kidney 10/31/2014  . Personal history of perinatal problems 10/24/2014  . Chronic pain associated with significant psychosocial dysfunction 02/21/2010  . Anemia, iron deficiency 01/29/2010  . Allergic rhinitis 12/01/2009  . DDD (degenerative disc disease), cervical 12/01/2009  . Major depression 06/30/2009  . Abdominal pain, generalized 03/21/2009  . Vitamin D deficiency 07/22/2008  . Hypercholesterolemia without hypertriglyceridemia 03/11/2007  . Essential and other specified forms of tremor 07/22/1998    Palliative Care Assessment & Plan   HPI: 66 y.o.femalewith past medical history of COPD on home oxygen, anxiety, hx of vaginal cancer,bedbound for the last 2+ yearswho was admitted on 5/23/2018with altered mental status and acute on chronic hypoxic respiratory failure. She was intubated and is being treated in the ICU. Blood cultures are positive for aerococcus bacteremia but thought to be a contaminate. Continues to have episodes of agitation on vent even with fentanyl, versed, and propofol. One way extubation 12/18/16.   Of note, Tiffany Mclean, "Tiffany Mclean" has completed Advanced Directives 2x in the past 2 years. She was adamant that she did not want to be intubated or have ACLS. Her paperwork is in EPIC   Assessment: I continue to meet and discuss with family  to help guide them with interventions. They are exhausted and emotional  and having a very difficult time. Versed has been stopped and she will go to med surg unit today. Family is at the point of wanting supportive care to prolong her life as she is able to have enjoyable interactions and time with family. BUT at the same time they also want her to be comfortable and not to suffer. Many questions throughout the day regarding comfort care and interventions. Much time spent explaining and offering emotional support to family.   Recommendations/Plan:  Pain/dyspnea: Fentanyl infusion @ 250 mcg/hr with 50-200 mcg bolus every 30 min prn.   Anxiety: Versed 1-2 mg every 30 min prn.   Secretions improved and Robinul changed from scheduled to prn.   IVF restarted given families wishes and desire and with improved secretions today.   Family still desires goal of comfort priority (no labs/CBGs and increased medication as needed) but also hoping for prolonged time with supportive care (IVF, antibiotics, steroids) as time since extubation has been enjoyable and important to patient and family.   Goals of Care and Additional Recommendations:  Limitations on Scope of Treatment: Minimize Medications  Code Status:  DNR  Prognosis:   Prognosis very poor and likely hours to days after extubation.   Discharge Planning:  Likely hospital death.    Thank you for allowing the Palliative Medicine Team to assist in the care of this patient.   Total Time 0930-0950, 1300-1345, 1031-5945 61mn Prolonged Time Billed  yes      Greater than 50%  of this time was spent counseling and coordinating care related to the above assessment and plan.  AVinie Sill NP Palliative Medicine Team Pager # [phone](M-F 8a-5p) Team Phone # [phone](Nights/Weekends)

## 2016-12-19 NOTE — Progress Notes (Signed)
SLP Cancellation Note  Patient Details Name: Tiffany Mclean MRN: [id number] DOB: [birth year]   Cancelled treatment:       Reason Eval/Treat Not Completed: Patient declined, no reason specified (chart reviewed; consulted MD. ) MD stated pt was comfort care at this time and stated ST to hold on performing BSE to lessen stress on pt. Versed and fentanyl drips infusing for comfort, pain and shortness of breath per notes. Pt currently does not have a diet order; receives oral care when awake.  ST services will be available for assessment while pt is admitted per request. MD agreed.    [name], MS, CCC-SLP [name],[name] 12/19/2016, 12:00 PM

## 2016-12-19 NOTE — Progress Notes (Signed)
PULMONARY / CRITICAL CARE MEDICINE   Name: Tiffany Mclean MRN: [id number] DOB: 04-Jan-1951    ADMISSION DATE:  12/11/2016    Synopsis:   66 yo female admitted 05/23 with acute on chronic hypercapnic hypoxic respiratory failure secondary to AECOPD, acute encephalopathy, lactic acidosis, and ?sepsis requiring mechanical intubation One way extubation performed on 5/30  REVIEW OF SYSTEMS:   Unable to obtain as patient is currently sedated.  SUBJECTIVE:  Pt remain intubated.   VITAL SIGNS: BP (!) 147/61   Pulse 90   Temp 99.5 F (37.5 C)   Resp 18   Ht 5\' 6"  (1.676 m)   Wt 132 lb 0.9 oz (59.9 kg)   SpO2 94%   BMI 21.31 kg/m   HEMODYNAMICS:    VENTILATOR SETTINGS:    INTAKE / OUTPUT: I/O last 3 completed shifts: In: 3808 [I.V.:2357.2; NG/GT:1150.8; IV Piggyback:300] Out: 6600 [Urine:6600]  PHYSICAL EXAMINATION: General:NAD  Neuro: sedated, moves upper and lower extremities, still withdraws from painful stimulation, PERRL HEENT: trachea midline, no JVD Cardiovascular: nsr, s1s2, no M/R/G Lungs:decreased breath sounds throughout, no wheezes or rhonchi, even non labored  Abdomen: Soft, nontender, nondistended, +BS x4 Musculoskeletal:  No joint deformities, no edema Skin: Warm and dry  LABS:  BMET  Recent Labs Lab 12/14/16 0245 12/15/16 0433 12/16/16 0429  NA 142 147* 141  K 4.5 4.9 5.1  CL 105 107 100*  CO2 33* 35* 34*  BUN 31* 28* 25*  CREATININE 0.51 0.41* 0.43*  GLUCOSE 118* 141* 146*    Electrolytes  Recent Labs Lab 12/13/16 0545 12/14/16 0245 12/15/16 0433 12/16/16 0429  CALCIUM 8.6* 8.7* 8.5* 8.7*  MG 2.0  --   --   --   PHOS 1.1* 2.4* 2.1* 3.2    CBC  Recent Labs Lab 12/13/16 0545  WBC 18.2*  HGB 8.3*  HCT 26.7*  PLT 172    Coag's No results for input(s): APTT, INR in the last 168 hours.  Sepsis Markers  Recent Labs Lab 12/12/16 2126 12/13/16 0545 12/14/16 0245  LATICACIDVEN 1.1 1.1  --   PROCALCITON  --  <0.10  <0.10    ABG  Recent Labs Lab 12/15/16 0830  PHART 7.39  PCO2ART 66*  PO2ART 68*    Liver Enzymes No results for input(s): AST, ALT, ALKPHOS, BILITOT, ALBUMIN in the last 168 hours.  Cardiac Enzymes No results for input(s): TROPONINI, PROBNP in the last 168 hours.  Glucose  Recent Labs Lab 12/16/16 1611 12/16/16 2016 12/17/16 0001 12/17/16 0357 12/17/16 0755 12/17/16 1201  GLUCAP 153* 136* 137* 150* 148* 137*    Imaging No results found.   DISCUSSION: This is a 66 year old Caucasian female presenting with acute hypercarbic/hypoxic respiratory failure, sepsis, septic shock, acute encephalopathy and lactic acidosis  ASSESSMENT / PLAN:  PULMONARY A: Acute hypercarbic/hypoxic respiratory failure Acute on chronic COPD exacerbation, Now on comfort measures   P:   One way extubation performed on 5/30. Continue comfort measures. Patient's husband appears to be still somewhat concerned about the patient's status, not sure that he is completely into and with the comfort cares/hospice philosophy, however, we'll continue to have these discussions with him. --Pt has completed course of abx; will discontinue.  DNR.      Marda Stalker, M.D.  Pulmonary/Critical Care PCCM Consult Pager [phone] (please enter 7 digits)   12/19/2016

## 2016-12-20 LAB — CULTURE, BLOOD (ROUTINE X 2)
CULTURE: NO GROWTH
Culture: NO GROWTH
SPECIAL REQUESTS: ADEQUATE — AB
Special Requests: ADEQUATE

## 2016-12-20 LAB — CBC
HEMATOCRIT: 34 % — AB (ref 35.0–47.0)
HEMOGLOBIN: 10.6 g/dL — AB (ref 12.0–16.0)
MCH: 28 pg (ref 26.0–34.0)
MCHC: 31.1 g/dL — ABNORMAL LOW (ref 32.0–36.0)
MCV: 89.8 fL (ref 80.0–100.0)
Platelets: 459 10*3/uL — ABNORMAL HIGH (ref 150–440)
RBC: 3.78 MIL/uL — AB (ref 3.80–5.20)
RDW: 14.3 % (ref 11.5–14.5)
WBC: 13.7 10*3/uL — AB (ref 3.6–11.0)

## 2016-12-20 MED ORDER — LORAZEPAM 2 MG/ML IJ SOLN
0.5000 mg | INTRAMUSCULAR | Status: DC | PRN
  Administered 2016-12-20 – 2016-12-25 (×9): 0.5 mg via INTRAVENOUS
  Filled 2016-12-20 (×9): qty 1

## 2016-12-20 NOTE — Progress Notes (Signed)
Nutrition Follow-up  DOCUMENTATION CODES:   Not applicable  INTERVENTION:  No further nutrition intervention warranted at this time. Patient is NPO per SLP and only taking supervised bites of ice chips and ice cream for comfort.   Will continue to monitor discussions regarding goals of care.  NUTRITION DIAGNOSIS:   Inadequate oral intake related to inability to eat as evidenced by NPO status.  Ongoing.  GOAL:   Patient will meet greater than or equal to 90% of their needs  Not met.  MONITOR:   Labs, Weight trends, I & O's, Vent status, TF tolerance  REASON FOR ASSESSMENT:   Ventilator    ASSESSMENT:   This is a 66 year old Caucasian female presenting with acute hypercarbic/hypoxic respiratory failure, sepsis, septic shock, acute encephalopathy and lactic acidosis  -Pt s/p one way extubation on 12/18/2016. NGT removed and TFs stopped. -Being followed by PMT. Goal is comfort but patient is not comfort care. IV fluids were restarted and family wants her to continue on antibiotics and steroids. Per PMT note prognosis hours to days.  Discussed with RN. NPO per SLP. Only having ice chips and bites of ice cream. No plans to restart TF.   Medications reviewed and include: methylprednisolone 40 mg Q12hrs, morphine gtt, D5 @ 50 ml/hr (60 grams dextrose, 204 kcal).  Labs reviewed: Platelets 459.  Diet Order:  Diet NPO time specified Except for: Ice Chips  Skin:  Reviewed, no issues  Last BM:  Unknown  Height:   Ht Readings from Last 1 Encounters:  12/19/16 5' 4"  (1.626 m)    Weight:   Wt Readings from Last 1 Encounters:  12/19/16 169 lb 3.2 oz (76.7 kg)    Ideal Body Weight:  59.09 kg  BMI:  Body mass index is 29.04 kg/m.  Estimated Nutritional Needs:   Kcal:  1387 calories  Protein:  72-90 gm protein  Fluid:  >/= 1.6L  EDUCATION NEEDS:   Education needs no appropriate at this time  Willey Blade, MS, RD, LDN Pager: [phone] After Hours Pager:  [phone]

## 2016-12-20 NOTE — Progress Notes (Signed)
SLP Cancellation Note  Patient Details Name: Tiffany Mclean MRN: [id number] DOB: April 03, 1951   Cancelled treatment:       Reason Eval/Treat Not Completed:  (chart reviewed; consulted Palliative Care re: pt). Spoke w/ Palliative Care yesterday afternoon re: initiation of ice chips and bites of applesauce for pleasure w/ 100% NSG supervision, aspiration precautions(order placed). This morning, Palliative Care stated to hold on formal BSE at this time. ST services will be available for assessment as indicated/needed.  NSG updated.     [name], MS, CCC-SLP Tiffany Mclean 12/20/2016, 10:05 AM

## 2016-12-20 NOTE — Progress Notes (Signed)
Family members were concerned about pt care at the beginning of the shift. Family members voiced concern about pt O2 sats. The husband and daughter were not on the same page regarding care and nursing supervisor was called into the room. Clarification were made about family's wishes by the son Tiffany Mclean and the MD was consulted and new orders were placed and medication administered. Pt rested for about 5 hours after ativan was ate a vanilla ice-cream during shift. Pt is currently resting with daughter at the bedside. Pt was weaned down to 3L during shift but it was increased back to 4L because of WOB. Pt is currently comfortable with no signs of distress noted. Will continue to monitor.

## 2016-12-20 NOTE — Progress Notes (Signed)
Patients husband and daughter at bedside with patient, attentive and appropriate behavior for situation. Educated patient and family regarding plan of care. Foley care provided, oxygen decreased to 3L, assessment completed. Patient given glycopyrrolate and ativan. Morphine gtt and D5 infusing as ordered. Emotional support provided. All questions answered. Clyde Canterbury, NT assisted with care.   All needs met before leaving bedside. Instructed patient and family to call if any needs arise. Explained how to call staff directly.   Madlyn Frankel, RN

## 2016-12-20 NOTE — Progress Notes (Signed)
Patient oxygen sat dropped to 78%. When arriving to bedside, patient was attempting to sit up and uncover. When talking to patient, she stated it was too warm and "I don't understand why I am breathing like this."   Uncovered patient, heat turned off, cool cloth applied to forehead. Ativan and glycopyrrolate given. Oxygen increased from 2.5L to 4L [place].   Remained with patient approximately 10 min. Patient became calm and work of breathing lessened.    5:53 PM Patient is sat 100% on 4L with no signs/symptoms of discomfort.   Family educated regarding possible reasoning for event. Patients husband and daughter remain at bedside.  Madlyn Frankel, RN

## 2016-12-20 NOTE — Progress Notes (Signed)
CH was paged by nurse about patient's family and help needed to comfort them. Patient is in final stages of what nurse described as a terminal illness. CH provided encouragement, prayer, and grief support.   12/20/16 1945  Clinical Encounter Type  Visited With Patient and family together  Visit Type Follow-up;Spiritual support  Referral From Nurse  Consult/Referral To Chaplain  Spiritual Encounters  Spiritual Needs Prayer;Emotional;Grief support

## 2016-12-20 NOTE — Progress Notes (Signed)
Patient resting comfortably, stating "I am just tired." Patients husband and daughter remain at bedside, intermittently resting. Emotional support provided. Madlyn Frankel, RN

## 2016-12-20 NOTE — Progress Notes (Signed)
Daily Progress Note   Patient Name: Tiffany Mclean       Date: 12/20/2016 DOB: Jul 02, 1951  Age: 66 y.o. MRN#: [id number] Attending Physician: Epifanio Lesches, MD Primary Care Physician: Birdie Sons, MD Admit Date: 12/11/2016  Reason for Consultation/Follow-up: Establishing goals of care, Extended family meeting, terminal care  Subjective: Tiffany Mclean is sleeping today - much needed rest. Husband and daughter at bedside.   Length of Stay: 8  Current Medications: Scheduled Meds:  . chlorhexidine gluconate (MEDLINE KIT)  15 mL Mouth Rinse BID  . mouth rinse  15 mL Mouth Rinse QID  . methylPREDNISolone (SOLU-MEDROL) injection  40 mg Intravenous Q12H  . polyvinyl alcohol  1 drop Both Eyes Q2H    Continuous Infusions: . dextrose 50 mL/hr at 12/20/16 0725  . morphine 10 mg/hr (12/20/16 0725)    PRN Meds: acetaminophen **OR** acetaminophen, antiseptic oral rinse, bisacodyl, glycopyrrolate, ipratropium-albuterol, LORazepam, morphine, [DISCONTINUED] ondansetron **OR** ondansetron (ZOFRAN) IV, polyvinyl alcohol  Physical Exam  Constitutional: She appears well-developed and well-nourished.  Cardiovascular: Normal rate and regular rhythm.   Pulmonary/Chest: No accessory muscle usage. No tachypnea and no bradypnea. No respiratory distress. She has decreased breath sounds. She has no wheezes. She has no rhonchi. She has no rales.  Abdominal: Soft. Normal appearance. She exhibits no distension and no ascites.  Neurological:  Sleeping today.   Nursing note and vitals reviewed.           Vital Signs: BP 114/66 (BP Location: Left Arm)   Pulse 79   Temp 97.6 F (36.4 C) (Oral)   Resp 16   Ht 5' 4"  (1.626 m)   Wt 76.7 kg (169 lb 3.2 oz)   SpO2 99%   BMI 29.04 kg/m  SpO2: SpO2: 99  % O2 Device: O2 Device: Nasal Cannula O2 Flow Rate: O2 Flow Rate (L/min): 2.5 L/min  Intake/output summary:   Intake/Output Summary (Last 24 hours) at 12/20/16 1021 Last data filed at 12/20/16 0725  Gross per 24 hour  Intake           1285.5 ml  Output             3700 ml  Net          -2414.5 ml   LBM: Last BM Date:  (last bowel movement unknown) Baseline Weight: Weight:  59.9 kg (132 lb) Most recent weight: Weight: 76.7 kg (169 lb 3.2 oz)       Palliative Assessment/Data: PPS: 10%    Flowsheet Rows     Most Recent Value  Intake Tab  Referral Department  Critical care  Unit at Time of Referral  ICU  Palliative Care Primary Diagnosis  Pulmonary  Date Notified  12/12/16  Palliative Care Type  New Palliative care  Reason for referral  Clarify Goals of Care, Counsel Regarding Hospice, Non-pain Symptom  Date of Admission  12/12/16  Date first seen by Palliative Care  12/13/16  # of days Palliative referral response time  1 Day(s)  # of days IP prior to Palliative referral  0  Clinical Assessment  Palliative Performance Scale Score  20%  Anxiety Max Last 24 Hours  8  Anxiety Min Last 24 Hours  4  Psychosocial & Spiritual Assessment  Palliative Care Outcomes  Patient/Family meeting held?  Yes  Who was at the meeting?  patient and husband Tiffany Mclean  Palliative Care Outcomes  Improved non-pain symptom therapy, Clarified goals of care, Counseled regarding hospice      Patient Active Problem List   Diagnosis Date Noted  . Ventilator dependence ([place])   . Acute metabolic encephalopathy   . Goals of care, counseling/discussion   . Palliative care encounter   . Cancer ([place])   . COPD (chronic obstructive pulmonary disease) ([place])   . Diabetes mellitus without complication ([place])   . Severe sepsis ([place])   . MRSA bacteremia   . Respiratory failure ([place]) 12/12/2016  . Dysphagia 04/21/2016  . COPD exacerbation ([place]) 04/17/2016  . Tremor 06/13/2015  . Depression 05/15/2015  .  Anxiety 02/13/2015  . Dermatitis, eczematoid 02/13/2015  . Hypokalemia 02/13/2015  . Lower extremity weakness 02/13/2015  . Leukocytosis 02/13/2015  . Magnesium deficiency 02/13/2015  . Symptomatic menopausal or female climacteric states 02/13/2015  . Panic disorder 02/13/2015  . Bed sore on buttock 02/08/2015  . COPD with emphysema ([place]) 11/28/2014  . Chronic respiratory failure with hypoxia ([place]) 11/28/2014  . GERD (gastroesophageal reflux disease) 11/28/2014  . Anemia of chronic disease 11/28/2014  . Pneumonia 11/23/2014  . Generalized anxiety disorder 11/23/2014  . Injury of kidney 10/31/2014  . Personal history of perinatal problems 10/24/2014  . Chronic pain associated with significant psychosocial dysfunction 02/21/2010  . Anemia, iron deficiency 01/29/2010  . Allergic rhinitis 12/01/2009  . DDD (degenerative disc disease), cervical 12/01/2009  . Major depression 06/30/2009  . Abdominal pain, generalized 03/21/2009  . Vitamin D deficiency 07/22/2008  . Hypercholesterolemia without hypertriglyceridemia 03/11/2007  . Essential and other specified forms of tremor 07/22/1998    Palliative Care Assessment & Plan   HPI: 66 y.o.femalewith past medical history of COPD on home oxygen, anxiety, hx of vaginal cancer,bedbound for the last 2+ yearswho was admitted on 5/23/2018with altered mental status and acute on chronic hypoxic respiratory failure. She was intubated and is being treated in the ICU. Blood cultures are positive for aerococcus bacteremia but thought to be a contaminate. Continues to have episodes of agitation on vent even with fentanyl, versed, and propofol. One way extubation 12/18/16.   Of note, Tiffany Mclean, "Tiffany Mclean" has completed Advanced Directives 2x in the past 2 years. She was adamant that she did not want to be intubated or have ACLS. Her paperwork is in EPIC   Assessment: I have discussed with Tiffany Mclean today and he says he and family have rested and  comfortable with our current plan.  We have discussed home with hospice and they are considering. We also discussed current interventions and I educated that when she is not having meaningful interactions anymore these interventions are not indicated. Tiffany Mclean says he understands. I am sure they will need further guidance for this.   Recommendations/Plan:  Pain/dyspnea: Morphine at 10 g/hr with 4 mg bolus every 30 min prn.   Anxiety: Ativan 0.5 mg every 4 hours prn.   Secretions improved and Robinul changed from scheduled to prn.   IVF restarted given families wishes and desire and with improved secretions.   Family still desires goal of comfort priority (limit labs/no CBGs and increased medication for comfort as needed) but also hoping for prolonged time with supportive care (IVF, antibiotics as indicated, steroids) as time since extubation has been enjoyable and important to patient and family.   They do want her monitored for infection (okay for CBC) and given antibiotics if indicated.   Titrate oxygen to keep sats 90% (do not place on BiPAP or high flow machine). DO NOT TRANSFER TO ICU/SD/TELE.   Goals of Care and Additional Recommendations:  Limitations on Scope of Treatment: Minimize Medications  Code Status:  DNR  Prognosis:   Prognosis very poor and continues to be likely days.   Discharge Planning:  Family considering hospice options.    Thank you for allowing the Palliative Medicine Team to assist in the care of this patient.   Total Time 0900-0920, 1620-1700 Prolonged Time Billed  48mn      Greater than 50%  of this time was spent counseling and coordinating care related to the above assessment and plan.  AVinie Sill NP Palliative Medicine Team Pager # [phone](M-F 8a-5p) Team Phone # [phone](Nights/Weekends)

## 2016-12-20 NOTE — Progress Notes (Signed)
[place] at [facility]                                                                                                                                                                                  Patient Demographics   Tiffany Mclean, is a 66 y.o. female, DOB - February 19, 1951, QBV:[id number]  Admit date - 12/11/2016   Admitting Physician Harvie Bridge, DO  Outpatient Primary MD for the patient is Birdie Sons, MD   LOS - 8  Subjective: Extubated and transferred to 1c.overnight events reviewed. Review of Systems:   CONSTITUTIONAL: resting now.  Vitals:   Vitals:   12/20/16 0600 12/20/16 0803 12/20/16 0940 12/20/16 1239  BP: 114/66   (!) 113/45  Pulse: 79   84  Resp: 16   16  Temp: 97.6 F (36.4 C)   98.7 F (37.1 C)  TempSrc: Oral   Oral  SpO2: 96% 95% 99% 92%  Weight:      Height:        Wt Readings from Last 3 Encounters:  12/19/16 76.7 kg (169 lb 3.2 oz)  05/30/16 59.9 kg (132 lb)  04/21/16 61.7 kg (136 lb)     Intake/Output Summary (Last 24 hours) at 12/20/16 1400 Last data filed at 12/20/16 0725  Gross per 24 hour  Intake           1285.5 ml  Output             3700 ml  Net          -2414.5 ml      Antibiotics   Anti-infectives    Start     Dose/Rate Route Frequency Ordered Stop   12/15/16 0900  vancomycin (VANCOCIN) IVPB 1000 mg/200 mL premix  Status:  Discontinued     1,000 mg 200 mL/hr over 60 Minutes Intravenous Every 12 hours 12/15/16 0832 12/17/16 1127   12/13/16 0400  vancomycin (VANCOCIN) IVPB 1000 mg/200 mL premix  Status:  Discontinued     1,000 mg 200 mL/hr over 60 Minutes Intravenous Every 12 hours 12/12/16 1922 12/13/16 1110   12/12/16 2000  vancomycin (VANCOCIN) IVPB 1000 mg/200 mL premix     1,000 mg 200 mL/hr over 60 Minutes Intravenous  Once 12/12/16 1921 12/12/16 2108   12/12/16 1000  vancomycin (VANCOCIN) IVPB 1000 mg/200 mL premix  Status:  Discontinued     1,000 mg 200 mL/hr over 60 Minutes  Intravenous Every 12 hours 12/12/16 0330 12/12/16 1219   12/12/16 0115  vancomycin (VANCOCIN) IVPB 1000 mg/200 mL premix     1,000 mg 200 mL/hr over 60 Minutes Intravenous  Once 12/12/16 0110 12/12/16 0336  12/11/16 2338  meropenem (MERREM) 500 MG/50ML IVPB SOLR    Comments:  David, Raquel: cabinet override      12/11/16 2338 12/12/16 1144   12/11/16 2330  meropenem (MERREM) 1 g in sodium chloride 0.9 % 100 mL IVPB  Status:  Discontinued     1 g 200 mL/hr over 30 Minutes Intravenous Every 8 hours 12/11/16 2316 12/19/16 0957      Medications   Scheduled Meds: . chlorhexidine gluconate (MEDLINE KIT)  15 mL Mouth Rinse BID  . mouth rinse  15 mL Mouth Rinse QID  . methylPREDNISolone (SOLU-MEDROL) injection  40 mg Intravenous Q12H  . polyvinyl alcohol  1 drop Both Eyes Q2H   Continuous Infusions: . dextrose 50 mL/hr at 12/20/16 0725  . morphine 10 mg/hr (12/20/16 0725)   PRN Meds:.acetaminophen **OR** acetaminophen, antiseptic oral rinse, bisacodyl, glycopyrrolate, ipratropium-albuterol, LORazepam, morphine, [DISCONTINUED] ondansetron **OR** ondansetron (ZOFRAN) IV, polyvinyl alcohol   Data Review:   Micro Results Recent Results (from the past 240 hour(s))  Blood Culture (routine x 2)     Status: Abnormal   Collection Time: 12/11/16 10:52 PM  Result Value Ref Range Status   Specimen Description BLOOD RT HAND  Final   Special Requests   Final    BOTTLES DRAWN AEROBIC AND ANAEROBIC Blood Culture adequate volume   Culture  Setup Time   Final    GRAM POSITIVE COCCI GRAM POSITIVE RODS IN BOTH AEROBIC AND ANAEROBIC BOTTLES CRITICAL RESULT CALLED TO, READ BACK BY AND VERIFIED WITH: JASON ROBBINS AT 1808 12/12/2016 BY TFK. JASON ROBBINS AT 2120 ON 12/12/2016 JJB    Culture (A)  Final    STAPHYLOCOCCUS SPECIES (COAGULASE NEGATIVE) THE SIGNIFICANCE OF ISOLATING THIS ORGANISM FROM A SINGLE SET OF BLOOD CULTURES WHEN MULTIPLE SETS ARE DRAWN IS UNCERTAIN. PLEASE NOTIFY THE MICROBIOLOGY  DEPARTMENT WITHIN ONE WEEK IF SPECIATION AND SENSITIVITIES ARE REQUIRED. DIPHTHEROIDS(CORYNEBACTERIUM SPECIES) Standardized susceptibility testing for this organism is not available. Performed at [facility], [place] [address] [place], [place] [address]    Report Status 12/14/2016 FINAL  Final  Blood Culture (routine x 2)     Status: None   Collection Time: 12/11/16 10:52 PM  Result Value Ref Range Status   Specimen Description BLOOD L FA  Final   Special Requests BOTTLES DRAWN AEROBIC AND ANAEROBIC [place]  Final   Culture NO GROWTH 5 DAYS  Final   Report Status 12/16/2016 FINAL  Final  Urine culture     Status: None   Collection Time: 12/11/16 10:52 PM  Result Value Ref Range Status   Specimen Description URINE, RANDOM  Final   Special Requests NONE  Final   Culture   Final    NO GROWTH Performed at [facility], [place] [address] [place], [place] [address]    Report Status 12/13/2016 FINAL  Final  Blood Culture ID Panel (Reflexed)     Status: Abnormal   Collection Time: 12/11/16 10:52 PM  Result Value Ref Range Status   Enterococcus species NOT DETECTED NOT DETECTED Final   Listeria monocytogenes NOT DETECTED NOT DETECTED Final   Staphylococcus species DETECTED (A) NOT DETECTED Final    Comment: Methicillin (oxacillin) resistant coagulase negative staphylococcus. Possible blood culture contaminant (unless isolated from more than one blood culture draw or clinical case suggests pathogenicity). No antibiotic treatment is indicated for blood  culture contaminants. CRITICAL RESULT CALLED TO, READ BACK BY AND VERIFIED WITH: JASON ROBBINS AT 1808 12/12/2016 BY TFK.    Staphylococcus  aureus NOT DETECTED NOT DETECTED Final   Methicillin resistance DETECTED (A) NOT DETECTED Final    Comment: CRITICAL RESULT CALLED TO, READ BACK BY AND VERIFIED WITH: JASON ROBBINS AT 1808 12/12/2016 BY TFK.    Streptococcus species NOT DETECTED NOT DETECTED Final   Streptococcus agalactiae NOT  DETECTED NOT DETECTED Final   Streptococcus pneumoniae NOT DETECTED NOT DETECTED Final   Streptococcus pyogenes NOT DETECTED NOT DETECTED Final   Acinetobacter baumannii NOT DETECTED NOT DETECTED Final   Enterobacteriaceae species NOT DETECTED NOT DETECTED Final   Enterobacter cloacae complex NOT DETECTED NOT DETECTED Final   Escherichia coli NOT DETECTED NOT DETECTED Final   Klebsiella oxytoca NOT DETECTED NOT DETECTED Final   Klebsiella pneumoniae NOT DETECTED NOT DETECTED Final   Proteus species NOT DETECTED NOT DETECTED Final   Serratia marcescens NOT DETECTED NOT DETECTED Final   Haemophilus influenzae NOT DETECTED NOT DETECTED Final   Neisseria meningitidis NOT DETECTED NOT DETECTED Final   Pseudomonas aeruginosa NOT DETECTED NOT DETECTED Final   Candida albicans NOT DETECTED NOT DETECTED Final   Candida glabrata NOT DETECTED NOT DETECTED Final   Candida krusei NOT DETECTED NOT DETECTED Final   Candida parapsilosis NOT DETECTED NOT DETECTED Final   Candida tropicalis NOT DETECTED NOT DETECTED Final  MRSA PCR Screening     Status: None   Collection Time: 12/12/16  2:17 AM  Result Value Ref Range Status   MRSA by PCR NEGATIVE NEGATIVE Final    Comment:        The GeneXpert MRSA Assay (FDA approved for NASAL specimens only), is one component of a comprehensive MRSA colonization surveillance program. It is not intended to diagnose MRSA infection nor to guide or monitor treatment for MRSA infections.   CULTURE, BLOOD (ROUTINE X 2) w Reflex to ID Panel     Status: None   Collection Time: 12/15/16  8:19 AM  Result Value Ref Range Status   Specimen Description BLOOD RIGHT WRIST  Final   Special Requests   Final    BOTTLES DRAWN AEROBIC AND ANAEROBIC Blood Culture adequate volume   Culture NO GROWTH 5 DAYS  Final   Report Status 12/20/2016 FINAL  Final  CULTURE, BLOOD (ROUTINE X 2) w Reflex to ID Panel     Status: Abnormal   Collection Time: 12/15/16  8:39 AM  Result Value  Ref Range Status   Specimen Description BLOOD LEFT HAND  Final   Special Requests AEROCOCCUS SPECIES Blood Culture adequate volume (A)  Final   Culture NO GROWTH 5 DAYS  Final   Report Status 12/20/2016 FINAL  Final  Culture, respiratory (NON-Expectorated)     Status: None (Preliminary result)   Collection Time: 12/15/16  8:40 AM  Result Value Ref Range Status   Specimen Description TRACHEAL ASPIRATE  Final   Special Requests NONE  Final   Gram Stain   Final    RARE WBC PRESENT,BOTH PMN AND MONONUCLEAR FEW GRAM POSITIVE COCCI    Culture   Final    MODERATE STAPHYLOCOCCUS EPIDERMIDIS Sent to [place] for further susceptibility testing. Performed at [facility], [place] [address], [place], [place] [address]    Report Status PENDING  Incomplete    Radiology Reports Dg Abd 1 View  Result Date: 12/12/2016 CLINICAL DATA:  NG tube placement EXAM: ABDOMEN - 1 VIEW COMPARISON:  None. FINDINGS: Enteric tube tip is in the left upper quadrant consistent with location in the upper stomach. Emphysematous changes suggested in the lung bases.  IMPRESSION: Enteric tube tip is in the left upper quadrant consistent with location in the upper stomach. Electronically Signed   By: Lucienne Capers M.D.   On: 12/12/2016 03:57   Ct Angio Chest Pe W Or Wo Contrast  Result Date: 12/12/2016 CLINICAL DATA:  Acute onset altered mental status. Confusion. Hypoxia. Initial encounter. EXAM: CT ANGIOGRAPHY CHEST WITH CONTRAST TECHNIQUE: Multidetector CT imaging of the chest was performed using the standard protocol during bolus administration of intravenous contrast. Multiplanar CT image reconstructions and MIPs were obtained to evaluate the vascular anatomy. CONTRAST:  75 mL of Isovue 370 IV contrast COMPARISON:  Chest radiograph performed earlier today at 12:11 a.m., and CTA of the chest performed 08/25/2015 FINDINGS: Cardiovascular:  There is no evidence of pulmonary embolus. The heart is normal in size. The  thoracic aorta is grossly unremarkable. The great vessels are within normal limits. Mediastinum/Nodes: The mediastinum is unremarkable in appearance. No mediastinal lymphadenopathy is seen. No pericardial effusion is identified. The patient's endotracheal tube is seen ending 3 cm above the carina. The thyroid gland is unremarkable. No axillary lymphadenopathy is appreciated. Lungs/Pleura: Bilateral emphysema is noted. Minimal left basilar atelectasis or scarring is noted. No pleural effusion or pneumothorax is seen. No masses are identified. Upper Abdomen: The visualized portions of the liver and spleen are grossly unremarkable. Musculoskeletal: No acute osseous abnormalities are identified. The visualized musculature is unremarkable in appearance. There is suggestion of chronic disruption of the patient left breast implant. Review of the MIP images confirms the above findings. IMPRESSION: 1. No evidence of pulmonary embolus. 2. Bilateral emphysema noted. Minimal left basilar atelectasis or scarring noted. Electronically Signed   By: Garald Balding M.D.   On: 12/12/2016 02:20   Dg Chest Port 1 View  Result Date: 12/15/2016 CLINICAL DATA:  Acute on chronic respiratory failure. EXAM: PORTABLE CHEST 1 VIEW COMPARISON:  Radiographs of Dec 13, 2016. FINDINGS: The heart size and mediastinal contours are within normal limits. Endotracheal and nasogastric tubes are unchanged in position. No pneumothorax or pleural effusion is noted. Emphysematous disease is noted in the upper lobes bilaterally. Hyperexpansion of the lungs is noted. Both lungs are clear. The visualized skeletal structures are unremarkable. IMPRESSION: Findings consistent with chronic obstructive pulmonary disease. Stable support apparatus. No acute abnormality seen. Electronically Signed   By: Marijo Conception, M.D.   On: 12/15/2016 08:55   Dg Chest Port 1 View  Result Date: 12/13/2016 CLINICAL DATA:  Respiratory failure. EXAM: PORTABLE CHEST 1 VIEW  COMPARISON:  CT 12/12/2016. Chest x-ray 12/12/2016, 12/11/2016, 04/17/2016 FINDINGS: Endotracheal tube, NG tube in stable position. Heart size normal. Increased density noted over the lungs most likely related to overlying breast implants. Mild bilateral pleural-parenchymal thickening noted most likely related scarring. COPD . No pleural effusion or pneumothorax. Prior cervical spine fusion . IMPRESSION: 1.  Lines and tubes in stable position. 2. Pleuroparenchymal thickening consistent scarring. COPD. Similar findings noted on prior exam . No acute abnormality. Electronically Signed   By: [name]  Register   On: 12/13/2016 07:06   Dg Chest Portable 1 View  Result Date: 12/12/2016 CLINICAL DATA:  Post intubation EXAM: PORTABLE CHEST 1 VIEW COMPARISON:  12/11/2016 FINDINGS: Interval placement of an endotracheal tube with tip measuring 5.3 cm above the carina. Heart size and pulmonary vascularity are normal. Emphysematous changes in the lungs with scattered fibrosis. No focal consolidation or airspace disease. No blunting of costophrenic angles. No pneumothorax. Postoperative changes in the cervical spine. IMPRESSION: Endotracheal tube tip measures 5.3 cm  above the carina, in good apparent location. Electronically Signed   By: Lucienne Capers M.D.   On: 12/12/2016 00:39   Dg Chest Port 1 View  Result Date: 12/12/2016 CLINICAL DATA:  Altered mental status EXAM: PORTABLE CHEST 1 VIEW COMPARISON:  04/17/2016 FINDINGS: Cervical spine hardware. Hyperinflation with emphysematous disease and bullous change in the right greater than left upper lobes. No acute consolidation or effusion. Stable cardiomediastinal silhouette. No pneumothorax. IMPRESSION: Hyperinflation with emphysematous disease. No acute infiltrate or edema Electronically Signed   By: Donavan Foil M.D.   On: 12/12/2016 00:11     CBC  Recent Labs Lab 12/20/16 0548  WBC 13.7*  HGB 10.6*  HCT 34.0*  PLT 459*  MCV 89.8  MCH 28.0  MCHC 31.1*   RDW 14.3    Chemistries   Recent Labs Lab 12/14/16 0245 12/15/16 0433 12/16/16 0429  NA 142 147* 141  K 4.5 4.9 5.1  CL 105 107 100*  CO2 33* 35* 34*  GLUCOSE 118* 141* 146*  BUN 31* 28* 25*  CREATININE 0.51 0.41* 0.43*  CALCIUM 8.7* 8.5* 8.7*   ------------------------------------------------------------------------------------------------------------------ estimated creatinine clearance is 68.4 mL/min (A) (by C-G formula based on SCr of 0.43 mg/dL (L)). ------------------------------------------------------------------------------------------------------------------ No results for input(s): HGBA1C in the last 72 hours. ------------------------------------------------------------------------------------------------------------------ No results for input(s): CHOL, HDL, LDLCALC, TRIG, CHOLHDL, LDLDIRECT in the last 72 hours. ------------------------------------------------------------------------------------------------------------------ No results for input(s): TSH, T4TOTAL, T3FREE, THYROIDAB in the last 72 hours.  Invalid input(s): FREET3 ------------------------------------------------------------------------------------------------------------------ No results for input(s): VITAMINB12, FOLATE, FERRITIN, TIBC, IRON, RETICCTPCT in the last 72 hours.  Coagulation profile No results for input(s): INR, PROTIME in the last 168 hours.  No results for input(s): DDIMER in the last 72 hours.  Cardiac Enzymes No results for input(s): CKMB, TROPONINI, MYOGLOBIN in the last 168 hours.  Invalid input(s): CK ------------------------------------------------------------------------------------------------------------------ Invalid input(s): [place]   This is a 66 y.o. 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 Acute  respiratory failure with hypoxia and hypercapnia, in a patient with home-O2 dependent COPD.   CT scan of the chest shows no pulmonary embolism And had extubation Day before yesterday and  Patient is not on full comfort care. Family wants IV fluids, steroids, nebulizers at the same time wants her on morphine drip and Ativan also. Confirmed with them and discussed with patient's husband and daughter and they  Both confirmed that they want her on morphine drip but also wants to continue IV fluids. And steroids.   #. Sepsis, with positive blood cultures due to coag-negative staph continue anabiotic, blood culture showed contamination Was on antibiotics, , family does not want blood work but wants IV hydration,   #. History of fibromyalgia/depression -On morphine drip as per patient's wishes.   #. History of hyperlipidemia - hold Lipitor  #. History of GERD - Updated husband on the condition and status Prognosis really poor.     Code Status Orders        Start     Ordered   12/12/16 1202  Do not attempt resuscitation (DNR)  Continuous    Question Answer Comment  In the event of cardiac or respiratory ARREST Do not call a "code blue"   In the event of cardiac or respiratory ARREST Do not perform Intubation, CPR, defibrillation or ACLS   In the event of cardiac or respiratory  ARREST Use medication by any route, position, wound care, and other measures to relive pain and suffering. May use oxygen, suction and manual treatment of airway obstruction as needed for comfort.      12/12/16 1219    Code Status History    Date Active Date Inactive Code Status Order ID Comments User Context   12/12/2016  2:18 AM 12/12/2016 12:19 PM Full Code [id number]  HugelmeyerUbaldo Glassing, DO Inpatient   04/17/2016 11:13 PM 04/21/2016  6:10 PM Full Code [id number]  Holley Raring, NP ED    Advance Directive Documentation     Most Recent Value  Type of Advance Directive  Living will  Pre-existing out of  facility DNR order (yellow form or pink MOST form)  -  "MOST" Form in Place?  -           Consults  Intensivists DVT Prophylaxis  Lovenox   Lab Results  Component Value Date   PLT 459 (H) 12/20/2016     Time Spent in minutes 52mn spent  Greater than 50% of time spent in care coordination and counseling patient regarding the condition and plan of care.   KEpifanio LeschesM.D on 12/20/2016 at 2:00 PM  Between 7am to 6pm - Pager - [phone]  After 6pm go to [email or web address] - password [email or web address]   Office  [phone]

## 2016-12-20 NOTE — Progress Notes (Signed)
Patients husband voicing concerns regarding elevated WBC discovered with AM labs. Explained to husband that a slightly elevated WBC does not necessarily warrant initiation of IV antibiotics and that the MD was aware of the results. Husband stated that "I may need to speak with someone of higher authority about this matter later tonight. The communication is not good in this hospital."   Husband has been aware of this information all shift. Husband did not voice concern of this matter until this evening. Dynamics in the room have changed at this time. Focus has shifted from focusing on the comfort of the patient, to various other issues.   7:28 PM Husband is now voicing concern regarding the need to turn her oxygen down because she has been over 100% for a long period of time. Oncoming nurse discussed this concern with the husband.   Madlyn Frankel, RN

## 2016-12-21 LAB — CBC
HCT: 31.2 % — ABNORMAL LOW (ref 35.0–47.0)
Hemoglobin: 9.8 g/dL — ABNORMAL LOW (ref 12.0–16.0)
MCH: 27.8 pg (ref 26.0–34.0)
MCHC: 31.5 g/dL — AB (ref 32.0–36.0)
MCV: 88.2 fL (ref 80.0–100.0)
Platelets: 460 10*3/uL — ABNORMAL HIGH (ref 150–440)
RBC: 3.54 MIL/uL — ABNORMAL LOW (ref 3.80–5.20)
RDW: 13.9 % (ref 11.5–14.5)
WBC: 11.2 10*3/uL — ABNORMAL HIGH (ref 3.6–11.0)

## 2016-12-21 MED ORDER — BISACODYL 5 MG PO TBEC
5.0000 mg | DELAYED_RELEASE_TABLET | Freq: Every day | ORAL | Status: DC | PRN
  Administered 2016-12-22 – 2016-12-24 (×3): 5 mg via ORAL
  Filled 2016-12-21 (×3): qty 1

## 2016-12-21 MED ORDER — DOCUSATE SODIUM 100 MG PO CAPS
100.0000 mg | ORAL_CAPSULE | Freq: Two times a day (BID) | ORAL | Status: DC
  Administered 2016-12-21 – 2016-12-25 (×8): 100 mg via ORAL
  Filled 2016-12-21 (×9): qty 1

## 2016-12-21 NOTE — Progress Notes (Signed)
[place] at [place] NAME: Tiffany Mclean    MR#:  [id number]  DATE OF BIRTH:  1950-10-06  SUBJECTIVE:  CHIEF COMPLAINT:   Chief Complaint  Patient presents with  . Altered Mental Status   SOB and weakness, on O2 [place] 3 L and morphine drip. Drowsy. Hypoxia sometimes per RN. REVIEW OF SYSTEMS:  Review of Systems  Constitutional: Positive for malaise/fatigue. Negative for chills and fever.  HENT: Negative for sore throat.   Eyes: Negative for blurred vision and double vision.  Respiratory: Positive for cough and shortness of breath. Negative for wheezing and stridor.   Cardiovascular: Negative for chest pain and leg swelling.  Gastrointestinal: Positive for constipation. Negative for abdominal pain, blood in stool, diarrhea, melena, nausea and vomiting.  Genitourinary: Negative for dysuria and hematuria.  Musculoskeletal: Negative for back pain.  Skin: Negative for itching and rash.  Neurological: Positive for weakness. Negative for focal weakness and loss of consciousness.    DRUG ALLERGIES:   Allergies  Allergen Reactions  . Asa [Aspirin]   . Avelox [Moxifloxacin]   . Cephalexin   . Codeine   . Erythromycin Ethylsuccinate   . Flagyl [Metronidazole]   . Latex   . Levaquin [Levofloxacin] Hives  . Meloxicam   . Shellfish Allergy Nausea And Vomiting and Other (See Comments)    Dizzy, fainting  . Sulfa Antibiotics   . Topiramate   . Doxycycline Rash  . Tetracyclines & Related Rash   VITALS:  Blood pressure (!) 148/52, pulse 92, temperature 98.3 F (36.8 C), temperature source Oral, resp. rate 18, height 5\' 4"  (1.626 m), weight 169 lb 3.2 oz (76.7 kg), SpO2 (!) 88 %. PHYSICAL EXAMINATION:  Physical Exam  Constitutional: She is well-developed, well-nourished, and in no distress.  HENT:  Mouth/Throat: Oropharynx is clear and moist.  Eyes: Conjunctivae and EOM are normal.  Neck: No JVD present. No tracheal deviation  present.  Cardiovascular: Normal rate, regular rhythm and normal heart sounds.  Exam reveals no gallop.   No murmur heard. Pulmonary/Chest: Effort normal and breath sounds normal. No respiratory distress. She has no wheezes. She has no rales.  Abdominal: Soft. Bowel sounds are normal. She exhibits no distension. There is no tenderness.  Musculoskeletal: She exhibits no edema or tenderness.  Neurological:  Drowsy.  Skin: No rash noted. No erythema.   LABORATORY PANEL:  Female CBC  Recent Labs Lab 12/21/16 0428  WBC 11.2*  HGB 9.8*  HCT 31.2*  PLT 460*   ------------------------------------------------------------------------------------------------------------------ Chemistries   Recent Labs Lab 12/16/16 0429  NA 141  K 5.1  CL 100*  CO2 34*  GLUCOSE 146*  BUN 25*  CREATININE 0.43*  CALCIUM 8.7*   RADIOLOGY:  No results found. 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 Acute respiratory failure with hypoxia and hypercapnia,in a patient with home-O2 dependent COPD.  CT scan of the chest shows no pulmonary embolism And had extubation and  Patient is not on full comfort care. Family wants IV fluids, steroids, nebulizers at the same time wants her on morphine drip and Ativan also. Confirmed with them and discussed with patient's husband and daughter and they  Both confirmed that they want her on morphine drip but also wants to continue IV fluids. And steroids.   #. Sepsis, with positive blood  cultures due to coag-negative staph She was on anabiotics, blood culture showed contamination, family does not want blood work but wants IV hydration,   #. History of fibromyalgia/depression -On morphine drip as per patient's wishes.   #. History of hyperlipidemia - hold Lipitor  #. History of  GERD - Updated husband on the condition and status Prognosis really poor.  All the records are reviewed and case discussed with Care Management/Social Worker. Management plans discussed with the patient, her husband, daughter and son, and they are in agreement.  CODE STATUS: DNR  TOTAL TIME TAKING CARE OF THIS PATIENT: 36 minutes.   More than 50% of the time was spent in counseling/coordination of care: YES  POSSIBLE D/C IN ? DAYS, DEPENDING ON CLINICAL CONDITION.   Demetrios Loll M.D on 12/21/2016 at 2:27 PM  Between 7am to 6pm - Pager - [phone]  After 6pm go to [email or web address] - Proofreader  [facility]  Office  [phone]  CC: Primary care physician; Birdie Sons, MD  Note: This dictation was prepared with Dragon dictation along with smaller phrase technology. Any transcriptional errors that result from this process are unintentional.

## 2016-12-21 NOTE — Evaluation (Signed)
Clinical/Bedside Swallow Evaluation Patient Details  Name: Tiffany Mclean MRN: [id number] Date of Birth: October 08, 1950  Today's Date: 12/21/2016 Time: SLP Start Time (ACUTE ONLY): 0900 SLP Stop Time (ACUTE ONLY): 1000 SLP Time Calculation (min) (ACUTE ONLY): 60 min  Past Medical History:  Past Medical History:  Diagnosis Date  . Cancer (HCC)    squamous cell, vaginal  . COPD (chronic obstructive pulmonary disease) ([place])   . Diabetes mellitus without complication ([place])   . Pneumonia    Past Surgical History:  Past Surgical History:  Procedure Laterality Date  . ABDOMINAL HYSTERECTOMY  1996   BSO. Dr. Ammie Dalton  . BREAST ENHANCEMENT SURGERY     rupture  . BREAST SURGERY  1983   Breast Augmentation  . [place]  . CHOLECYSTECTOMY  12/20/2010   Laparoscopic, Dr. Marina Gravel, [facility]  . EYE SURGERY     1977  . TUBAL LIGATION  1986   HPI:  Pt is a 66 y.o. female with past medical history of COPD on home oxygen, anxiety, hx of vaginal cancer, bedbound for the last 2+ years who was admitted on 12/11/2016 with altered mental status and acute on chronic hypoxic respiratory failure. She was intubated and is being treated in the ICU.  Blood cultures are positive for aerococcus bacteremia but thought to be a contaminate.  Continues to have episodes of agitation on vent even with fentanyl, versed, and propofol. One way extubation 12/18/16. Patient is not on full comfort care. Family wants IV fluids, steroids, nebulizers at the same time wants her on morphine drip and Ativan also at this time; it appears comfort care is still being discussed w/ Palliative Care/MD. Family would like pt to have an oral diet for pleasure but wonder what is best for pt to eat/drink.    Assessment / Plan / Recommendation Clinical Impression  Pt appears at increased risk for aspiration moreso w/ liquids d/t overall weakness, extended illness, pulmonary decline, and drowsiness from continuous sedating  medication being given(Ativan). Pt appeared to manage and tolerate few small trials of thin liquids, ice chips, purees and a softened solid following strict aspiration precautions/strategies to support and only gave a mild throat clearing x1 post thin liquids. Pt's presentation and risks were thoroughly discussed w/ family as were the support strategies and aspiration precautions to help lessen risk for aspiration. Family would like for pt to have an oral diet for her pleasure. This was discussed w/ family in room and MD/NSG; MD gave ok for order. Recommend a dysphagia level 3 for the cut meats and moistened foods - suggested to family that they bring in some of pt's favoriate foods for her. ST services will be available for further education on aspiration precautions and support strategies for pt as needed while she is admitted. Family agreed.  SLP Visit Diagnosis: Dysphagia, oropharyngeal phase (R13.12)    Aspiration Risk  Mild aspiration risk;Moderate aspiration risk    Diet Recommendation  Dysphagia level 3 w/ moistened foods; Thin liquids - recommend via cup. Aspiration precautions; support at all meals to assist and monitor  Medication Administration: Crushed with puree    Other  Recommendations Recommended Consults:  (Palliative Care following) Oral Care Recommendations: Oral care BID;Staff/trained caregiver to provide oral care   Follow up Recommendations None      Frequency and Duration  (TBD)   (TBD)       Prognosis Prognosis for Safe Diet Advancement: Fair Barriers to Reach Goals: Severity of deficits (illness)  Swallow Study   General Date of Onset: 12/11/16 HPI: Pt is a 66 y.o. female with past medical history of COPD on home oxygen, anxiety, hx of vaginal cancer, bedbound for the last 2+ years who was admitted on 12/11/2016 with altered mental status and acute on chronic hypoxic respiratory failure. She was intubated and is being treated in the ICU.  Blood cultures are  positive for aerococcus bacteremia but thought to be a contaminate.  Continues to have episodes of agitation on vent even with fentanyl, versed, and propofol. One way extubation 12/18/16. Patient is not on full comfort care. Family wants IV fluids, steroids, nebulizers at the same time wants her on morphine drip and Ativan also at this time; it appears comfort care is still being discussed w/ Palliative Care/MD. Family would like pt to have an oral diet for pleasure but wonder what is best for pt to eat/drink.  Type of Study: Bedside Swallow Evaluation Previous Swallow Assessment: none Diet Prior to this Study: Regular;Thin liquids (at home; fairly NPO since admission but taking ice chips) Temperature Spikes Noted: No (wbc 11.2) Respiratory Status: Nasal cannula (2.5 liters) History of Recent Intubation: Yes Length of Intubations (days): 7 days Date extubated: 12/18/16 Behavior/Cognition: Alert;Cooperative;Pleasant mood;Distractible;Requires cueing;Lethargic/Drowsy Oral Cavity Assessment: Dry Oral Care Completed by SLP: Recent completion by staff (and family) Oral Cavity - Dentition: Adequate natural dentition Vision: Functional for self-feeding (fair) Self-Feeding Abilities: Needs assist;Needs set up;Total assist Patient Positioning: Upright in bed (use of pillows and towel roll) Baseline Vocal Quality: Low vocal intensity Volitional Cough: Weak Volitional Swallow: Able to elicit    Oral/Motor/Sensory Function Overall Oral Motor/Sensory Function: Within functional limits   Ice Chips Ice chips: Within functional limits Presentation: Spoon (3 trials)   Thin Liquid Thin Liquid: Impaired Presentation: Cup;Self Fed (fully assisted; 3 trials) Oral Phase Impairments:  (wfl) Oral Phase Functional Implications:  (wfl) Pharyngeal  Phase Impairments: Suspected delayed Swallow;Throat Clearing - Delayed (x1; audible swallow) Other Comments: suspect larger sips could be more difficult for pt to manage  oropharyngeally    Nectar Thick Nectar Thick Liquid: Not tested   Honey Thick Honey Thick Liquid: Not tested   Puree Puree: Within functional limits Presentation: Spoon (fed; 4 trials)   Solid   GO   Solid: Impaired (mech soft) Presentation: Spoon (1 trial) Oral Phase Impairments: Poor awareness of bolus (from spoon) Oral Phase Functional Implications: Prolonged oral transit;Impaired mastication;Oral residue (min - oral dryness) Pharyngeal Phase Impairments:  (none) Other Comments: bolus material cleared w/ alternating w/ tsp of applesauce to [place], MS, CCC-SLP [name],[name] 12/21/2016,10:57 AM

## 2016-12-21 NOTE — Progress Notes (Signed)
Called into room by daughter to look at breathing. Pt sating 96% on 2.5 L but appeared to have labored breathing. Repositioned pt from left to right side-arousing the patient, sisters continuously talking and stimulating patient. Sats dropping to 86-88%. Encouraged family to let patient rest and not wake her up so her breathing could stablelize. Sisters upset that I ask them not to wake the patient. Tiffany Mclean the daughter states"I hate for you to be mad but I agree with the nurse"   Pt resting with eyes closed and sats back up to 93%.

## 2016-12-21 NOTE — Progress Notes (Signed)
Daughter at son and bedside-discussed lab work and will compare with this morning lab work.  Son expressed gratitude for all we have done and they are on board with letting their mothers body decide if it will recover or not. They both are very grateful for time spent explaining things to them and apologized for rude behavior from other family members.

## 2016-12-22 NOTE — Plan of Care (Signed)
Problem: Role Relationship: Goal: Familys ability to cope with current situation will improve Outcome: Progressing Pt's spouse verbalized that he would like to take the pt home with hospice either Monday, 6/4 or Tuesday, 12/24/16.  Hospice liason to return to [facility] on Monday, 12/23/16

## 2016-12-22 NOTE — Plan of Care (Signed)
Problem: Pain Management: Goal: General experience of comfort will improve Outcome: Progressing Morphine infusion continues at 8mg /hr

## 2016-12-22 NOTE — Progress Notes (Signed)
[facility] NAME: Tiffany Mclean    MR#:  [id number]  DATE OF BIRTH:  1951-03-16  SUBJECTIVE:  CHIEF COMPLAINT:   Chief Complaint  Patient presents with  . Altered Mental Status   SOB and weakness, on O2 [place] 3 L and morphine drip. Hypoxia while moving or talking. More awake today. REVIEW OF SYSTEMS:  Review of Systems  Constitutional: Positive for malaise/fatigue. Negative for chills and fever.  HENT: Negative for sore throat.   Eyes: Negative for blurred vision and double vision.  Respiratory: Positive for cough and shortness of breath. Negative for wheezing and stridor.   Cardiovascular: Negative for chest pain and leg swelling.  Gastrointestinal: Positive for constipation. Negative for abdominal pain, blood in stool, diarrhea, melena, nausea and vomiting.  Genitourinary: Negative for dysuria and hematuria.  Musculoskeletal: Negative for back pain.  Skin: Negative for itching and rash.  Neurological: Positive for weakness. Negative for focal weakness and loss of consciousness.    DRUG ALLERGIES:   Allergies  Allergen Reactions  . Asa [Aspirin]   . Avelox [Moxifloxacin]   . Cephalexin   . Codeine   . Erythromycin Ethylsuccinate   . Flagyl [Metronidazole]   . Latex   . Levaquin [Levofloxacin] Hives  . Meloxicam   . Shellfish Allergy Nausea And Vomiting and Other (See Comments)    Dizzy, fainting  . Sulfa Antibiotics   . Topiramate   . Doxycycline Rash  . Tetracyclines & Related Rash   VITALS:  Blood pressure (!) 128/58, pulse 77, temperature 98.6 F (37 C), temperature source Oral, resp. rate 16, height 5\' 4"  (1.626 m), weight 169 lb 3.2 oz (76.7 kg), SpO2 92 %. PHYSICAL EXAMINATION:  Physical Exam  Constitutional: She is well-developed, well-nourished, and in no distress.  HENT:  Mouth/Throat: Oropharynx is clear and moist.  Eyes: Conjunctivae and EOM are normal.  Neck: No JVD present. No tracheal  deviation present.  Cardiovascular: Normal rate, regular rhythm and normal heart sounds.  Exam reveals no gallop.   No murmur heard. Pulmonary/Chest: Effort normal. No respiratory distress. She has no wheezes. She has no rales.  very diminished lung SOUNDS.  Abdominal: Soft. Bowel sounds are normal. She exhibits no distension. There is no tenderness.  Musculoskeletal: She exhibits no edema or tenderness.  Skin: No rash noted. No erythema.   LABORATORY PANEL:  Female CBC  Recent Labs Lab 12/21/16 0428  WBC 11.2*  HGB 9.8*  HCT 31.2*  PLT 460*   ------------------------------------------------------------------------------------------------------------------ Chemistries   Recent Labs Lab 12/16/16 0429  NA 141  K 5.1  CL 100*  CO2 34*  GLUCOSE 146*  BUN 25*  CREATININE 0.43*  CALCIUM 8.7*   RADIOLOGY:  No results found. 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 Acute respiratory failure with hypoxia and hypercapnia,in a patient with home-O2 dependent COPD.  CT scan of the chest shows no pulmonary embolism And had extubation and  Patient is not on full comfort care. Family wants IV fluids, steroids, nebulizers at the same time wants her on morphine drip and Ativan also. Confirmed with them and discussed with patient's husband and daughter and they  Both confirmed that they want her on morphine drip but also wants to continue IV fluids. And steroids.   #. Sepsis, with positive blood cultures  due to coag-negative staph She was on anabiotics, blood culture showed contamination, family does not want blood work but wants IV hydration,   #. History of fibromyalgia/depression -On morphine drip as per patient's wishes.   #. History of hyperlipidemia - hold Lipitor  #. History of GERD -  Updated husband on the condition and status Prognosis really poor. Follow up palliative care consult. She may be discharged to home with hospice or hospice home in 2 days. All the records are reviewed and case discussed with Care Management/Social Worker. Management plans discussed with the patient, her husband, and they are in agreement.  CODE STATUS: DNR  TOTAL TIME TAKING CARE OF THIS PATIENT: 28 minutes.   More than 50% of the time was spent in counseling/coordination of care: YES  POSSIBLE D/C IN 2 DAYS, DEPENDING ON CLINICAL CONDITION.   Demetrios Loll M.D on 12/22/2016 at 11:44 AM  Between 7am to 6pm - Pager - [phone]  After 6pm go to [email or web address] - Proofreader  [facility]  Office  [phone]  CC: Primary care physician; Birdie Sons, MD  Note: This dictation was prepared with Dragon dictation along with smaller phrase technology. Any transcriptional errors that result from this process are unintentional.

## 2016-12-22 NOTE — Clinical Social Work Note (Signed)
CSW following due to partial comfort care. Please consult should the family decide to transition to full comfort care.  Santiago Bumpers, MSW, Latanya Presser [phone]

## 2016-12-23 DIAGNOSIS — J441 Chronic obstructive pulmonary disease with (acute) exacerbation: Secondary | ICD-10-CM | POA: Diagnosis not present

## 2016-12-23 DIAGNOSIS — Z7189 Other specified counseling: Secondary | ICD-10-CM | POA: Diagnosis not present

## 2016-12-23 DIAGNOSIS — Z515 Encounter for palliative care: Secondary | ICD-10-CM | POA: Diagnosis not present

## 2016-12-23 MED ORDER — ENSURE ENLIVE PO LIQD
237.0000 mL | Freq: Three times a day (TID) | ORAL | Status: DC
  Administered 2016-12-23 – 2016-12-25 (×7): 237 mL via ORAL

## 2016-12-23 MED ORDER — POLYVINYL ALCOHOL 1.4 % OP SOLN
1.0000 [drp] | Freq: Four times a day (QID) | OPHTHALMIC | Status: DC
  Administered 2016-12-23 – 2016-12-25 (×9): 1 [drp] via OPHTHALMIC
  Filled 2016-12-23: qty 15

## 2016-12-23 NOTE — Progress Notes (Signed)
Daily Progress Note   Patient Name: Tiffany Mclean       Date: 12/23/2016 DOB: 1950/09/26  Age: 66 y.o. MRN#: [id number] Attending Physician: Demetrios Loll, MD Primary Care Physician: Birdie Sons, MD Admit Date: 12/11/2016  Reason for Consultation/Follow-up: Establishing goals of care, Extended family meeting, terminal care  Subjective: Tiffany Mclean is more alert today (just received Ativan recently per husband Tiffany Mclean so dozing off and on throughout conversation). No complaints. Tiffany Mclean said she had a panic attack last night.   Length of Stay: 11  Current Medications: Scheduled Meds:  . chlorhexidine gluconate (MEDLINE KIT)  15 mL Mouth Rinse BID  . docusate sodium  100 mg Oral BID  . mouth rinse  15 mL Mouth Rinse QID  . methylPREDNISolone (SOLU-MEDROL) injection  40 mg Intravenous Q12H  . polyvinyl alcohol  1 drop Both Eyes QID    Continuous Infusions: . dextrose 50 mL/hr at 12/23/16 0909  . morphine 8 mg/hr (12/23/16 0545)    PRN Meds: acetaminophen **OR** acetaminophen, antiseptic oral rinse, bisacodyl, bisacodyl, glycopyrrolate, ipratropium-albuterol, LORazepam, morphine, [DISCONTINUED] ondansetron **OR** ondansetron (ZOFRAN) IV, polyvinyl alcohol  Physical Exam  Constitutional: She appears well-developed and well-nourished.  Cardiovascular: Normal rate and regular rhythm.   Pulmonary/Chest: No accessory muscle usage. No tachypnea and no bradypnea. No respiratory distress. She has decreased breath sounds. She has no wheezes. She has no rhonchi. She has no rales.  Abdominal: Soft. Normal appearance. She exhibits no distension and no ascites.  Neurological: She is alert.  A little confused in conversation.   Nursing note and vitals reviewed.           Vital Signs: BP 123/66 (BP  Location: Left Arm)   Pulse 65   Temp 98.3 F (36.8 C)   Resp 20   Ht 5' 4"  (1.626 m)   Wt 76.7 kg (169 lb 3.2 oz)   SpO2 90%   BMI 29.04 kg/m  SpO2: SpO2: 90 % O2 Device: O2 Device: Nasal Cannula O2 Flow Rate: O2 Flow Rate (L/min): 3 L/min  Intake/output summary:   Intake/Output Summary (Last 24 hours) at 12/23/16 0921 Last data filed at 12/23/16 0400  Gross per 24 hour  Intake          2264.38 ml  Output  3500 ml  Net         -1235.62 ml   LBM: Last BM Date:  (unknown; prior to admission; pt does not remember) Baseline Weight: Weight: 59.9 kg (132 lb) Most recent weight: Weight: 76.7 kg (169 lb 3.2 oz)       Palliative Assessment/Data: PPS: 30%    Flowsheet Rows     Most Recent Value  Intake Tab  Referral Department  Critical care  Unit at Time of Referral  ICU  Palliative Care Primary Diagnosis  Pulmonary  Date Notified  12/12/16  Palliative Care Type  New Palliative care  Reason for referral  Clarify Goals of Care, Counsel Regarding Hospice, Non-pain Symptom  Date of Admission  12/12/16  Date first seen by Palliative Care  12/13/16  # of days Palliative referral response time  1 Day(s)  # of days IP prior to Palliative referral  0  Clinical Assessment  Palliative Performance Scale Score  20%  Anxiety Max Last 24 Hours  8  Anxiety Min Last 24 Hours  4  Psychosocial & Spiritual Assessment  Palliative Care Outcomes  Patient/Family meeting held?  Yes  Who was at the meeting?  patient and husband [name]  Palliative Care Outcomes  Improved non-pain symptom therapy, Clarified goals of care, Counseled regarding hospice      Patient Active Problem List   Diagnosis Date Noted  . Ventilator dependence ([place])   . Acute metabolic encephalopathy   . Goals of care, counseling/discussion   . Palliative care encounter   . Cancer ([place])   . COPD (chronic obstructive pulmonary disease) ([place])   . Diabetes mellitus without complication ([place])   . Severe sepsis  ([place])   . MRSA bacteremia   . Respiratory failure ([place]) 12/12/2016  . Dysphagia 04/21/2016  . COPD exacerbation ([place]) 04/17/2016  . Tremor 06/13/2015  . Depression 05/15/2015  . Anxiety 02/13/2015  . Dermatitis, eczematoid 02/13/2015  . Hypokalemia 02/13/2015  . Lower extremity weakness 02/13/2015  . Leukocytosis 02/13/2015  . Magnesium deficiency 02/13/2015  . Symptomatic menopausal or female climacteric states 02/13/2015  . Panic disorder 02/13/2015  . Bed sore on buttock 02/08/2015  . COPD with emphysema ([place]) 11/28/2014  . Chronic respiratory failure with hypoxia ([place]) 11/28/2014  . GERD (gastroesophageal reflux disease) 11/28/2014  . Anemia of chronic disease 11/28/2014  . Pneumonia 11/23/2014  . Generalized anxiety disorder 11/23/2014  . Injury of kidney 10/31/2014  . Personal history of perinatal problems 10/24/2014  . Chronic pain associated with significant psychosocial dysfunction 02/21/2010  . Anemia, iron deficiency 01/29/2010  . Allergic rhinitis 12/01/2009  . DDD (degenerative disc disease), cervical 12/01/2009  . Major depression 06/30/2009  . Abdominal pain, generalized 03/21/2009  . Vitamin D deficiency 07/22/2008  . Hypercholesterolemia without hypertriglyceridemia 03/11/2007  . Essential and other specified forms of tremor 07/22/1998    Palliative Care Assessment & Plan   HPI: 66 y.o.femalewith past medical history of COPD on home oxygen, anxiety, hx of vaginal cancer,bedbound for the last 2+ yearswho was admitted on 5/23/2018with altered mental status and acute on chronic hypoxic respiratory failure. She was intubated and is being treated in the ICU. Blood cultures are positive for aerococcus bacteremia but thought to be a contaminate. Continues to have episodes of agitation on vent even with fentanyl, versed, and propofol. One way extubation 12/18/16. Has done better than previously expected pot extubation which has made decisions and coping difficult  for family.    Assessment: I met again this morning with  Tiffany Mclean and Tiffany Mclean - no other family at bedside. We tried to discern from Tiffany Mclean her wishes regarding how she would best spend her time but she had difficulty explaining and staying awake. We spoke primarily about home with hospice and what hospice provides and family responsibilities. Also discussed that she could receive morphine infusion as indicated but would not receive any other IV medications or IVF. Also discussed that they would not be doing blood work or aggressive measures. Possible could have antibiotic for UTI IF able to swallow pills. Discussed administration of SL medications for comfort as well. We did discuss that we are in a holding pattern where she is better than anticipated but still at EOL but it is time to think of the next step from the hospital. We also discussed hospice facility which would be full comfort care. [name] plans to discuss further with his family.    I visited again later today and Tiffany Mclean has been discussing with his family and his insurance company for extra assistance for them at home. He has some ideas in the works. If these plans come together he will likely like to discuss more with his daughter and hospice representative tomorrow. Support provided.   Recommendations/Plan:  Pain/dyspnea: Morphine decreased to 6 mg/hr with 4 mg bolus every 30 min prn.   Anxiety: Ativan 0.5 mg every 4 hours prn.   Secretions improved and Robinul changed from scheduled to prn.   IVF restarted given families wishes and desire and with improved secretions.   Family still desires goal of comfort priority (limit labs/no CBGs and increased medication for comfort as needed) but also hoping for prolonged time with supportive care (IVF, antibiotics as indicated, steroids) as time since extubation has been enjoyable and important to patient and family.   They do want her monitored for infection (okay for CBC) and given antibiotics if  indicated.   Titrate oxygen to keep sats 90% (do not place on BiPAP or high flow machine). DO NOT TRANSFER TO ICU/SD/TELE.   Goals of Care and Additional Recommendations:  Limitations on Scope of Treatment: Minimize Medications  Code Status:  DNR  Prognosis:   Prognosis poor but could be weeks.   Discharge Planning:  Family considering hospice options.    Thank you for allowing the Palliative Medicine Team to assist in the care of this patient.   Total Time 0830-0900, 1330-1345 47mn Prolonged Time Billed  no      Greater than 50%  of this time was spent counseling and coordinating care related to the above assessment and plan.  AVinie Sill NP Palliative Medicine Team Pager # [phone](M-F 8a-5p) Team Phone # [phone](Nights/Weekends)

## 2016-12-23 NOTE — Progress Notes (Signed)
Nutrition Follow-up  DOCUMENTATION CODES:   Not applicable  INTERVENTION:  Provide Ensure Enlive po TID between meals, each supplement provides 350 kcal and 20 grams of protein. Patient prefers vanilla.  NUTRITION DIAGNOSIS:   Inadequate oral intake related to poor appetite, other (see comment) (recent extubation, difficulty breathing) as evidenced by per patient/family report, meal completion < 50%.  Ongoing.  GOAL:   Patient will meet greater than or equal to 90% of their needs  Not met.  MONITOR:   PO intake, Supplement acceptance, Labs, Weight trends, I & O's  REASON FOR ASSESSMENT:   Ventilator    ASSESSMENT:   This is a 66 year old Caucasian female presenting with acute hypercarbic/hypoxic respiratory failure, sepsis, septic shock, acute encephalopathy and lactic acidosis  -Pt s/p one way extubation on 12/18/2016. NGT removed and TFs stopped. -Being followed by PMT. Goal is comfort but patient is not comfort care. IV fluids were restarted and family wants her to continue on antibiotics and steroids. Per PMT note prognosis hours to days. -On 6/2 diet was advanced to dysphagia 3 with thin liquids following SLP assessment. Family had requested oral diet for pleasure.  Spoke with patient and husband at bedside. They report her appetite is improving some. She ate better at supper than she has in the past few days. They report she had some of her beef, carrots, and banana pudding. Report she has not been choking with her current diet. Still having difficulty breathing impacting ability to eat. Denies any N/V or abdominal pain. Reports she has not had a BM yet. Patient reports she loves Ensure and would like to drink some here. Usually drinks at home. She enjoys vanilla flavor.  Meal Completion: 0-25%  Medications reviewed and include: Colace, Medline mouth rinse, methylprednisolone 40 mg Q12hrs, morphine, D5 @ 50 ml/hr (60 grams dextrose, 204 kcal daily).  Labs reviewed. No  new labs in past 24 hrs.  Discussed with RN.  Diet Order:  DIET DYS 3 Room service appropriate? Yes with Assist; Fluid consistency: Thin  Skin:  Wound (see comment) (skin tears to back and buttocks)  Last BM:  Unknown  Height:   Ht Readings from Last 1 Encounters:  12/19/16 _0  (1.626 m)    Weight:   Wt Readings from Last 1 Encounters:  12/19/16 169 lb 3.2 oz (76.7 kg)    Ideal Body Weight:  59.09 kg  BMI:  Body mass index is 29.04 kg/m.  Estimated Nutritional Needs:   Kcal:  1550-1810 (MSJ x 1.2-1.4)  Protein:  77-92 grams (1-1.2 grams/kg)  Fluid:  1.5-1.8 L/day  EDUCATION NEEDS:   No education needs identified at this time  Willey Blade, MS, RD, LDN Pager: [phone] After Hours Pager: [phone]

## 2016-12-23 NOTE — Progress Notes (Signed)
[facility] [place] NAME: Tiffany Mclean    MR#:  [id number]  DATE OF BIRTH:  09/27/1950  SUBJECTIVE:  CHIEF COMPLAINT:   Chief Complaint  Patient presents with  . Altered Mental Status   SOB and weakness, on O2 [place] 3 L and morphine drip. Hypoxia while moving or talking. Anxious. REVIEW OF SYSTEMS:  Review of Systems  Constitutional: Positive for malaise/fatigue. Negative for chills and fever.  HENT: Negative for sore throat.   Eyes: Negative for blurred vision and double vision.  Respiratory: Positive for cough and shortness of breath. Negative for wheezing and stridor.   Cardiovascular: Negative for chest pain and leg swelling.  Gastrointestinal: Positive for constipation. Negative for abdominal pain, blood in stool, diarrhea, melena, nausea and vomiting.  Genitourinary: Negative for dysuria and hematuria.  Musculoskeletal: Negative for back pain.  Skin: Negative for itching and rash.  Neurological: Positive for weakness. Negative for focal weakness and loss of consciousness.  Psychiatric/Behavioral: Negative for depression. The patient is nervous/anxious.     DRUG ALLERGIES:   Allergies  Allergen Reactions  . Asa [Aspirin]   . Avelox [Moxifloxacin]   . Cephalexin   . Codeine   . Erythromycin Ethylsuccinate   . Flagyl [Metronidazole]   . Latex   . Levaquin [Levofloxacin] Hives  . Meloxicam   . Shellfish Allergy Nausea And Vomiting and Other (See Comments)    Dizzy, fainting  . Sulfa Antibiotics   . Topiramate   . Doxycycline Rash  . Tetracyclines & Related Rash   VITALS:  Blood pressure (!) 148/98, pulse 86, temperature 98.2 F (36.8 C), temperature source Oral, resp. rate 20, height 5\' 4"  (1.626 m), weight 169 lb 3.2 oz (76.7 kg), SpO2 97 %. PHYSICAL EXAMINATION:  Physical Exam  Constitutional: She is well-developed, well-nourished, and in no distress.  HENT:  Mouth/Throat: Oropharynx is clear and moist.    Eyes: Conjunctivae and EOM are normal.  Neck: No JVD present. No tracheal deviation present.  Cardiovascular: Normal rate, regular rhythm and normal heart sounds.  Exam reveals no gallop.   No murmur heard. Pulmonary/Chest: Effort normal. No respiratory distress. She has no wheezes. She has no rales.  very diminished lung SOUNDS.  Abdominal: Soft. Bowel sounds are normal. She exhibits no distension. There is no tenderness.  Musculoskeletal: She exhibits no edema or tenderness.  Skin: No rash noted. No erythema.   LABORATORY PANEL:  Female CBC  Recent Labs Lab 12/21/16 0428  WBC 11.2*  HGB 9.8*  HCT 31.2*  PLT 460*   ------------------------------------------------------------------------------------------------------------------ Chemistries  No results for input(s): NA, K, CL, CO2, GLUCOSE, BUN, CREATININE, CALCIUM, MG, AST, ALT, ALKPHOS, BILITOT in the last 168 hours.  Invalid input(s): GFRCGP RADIOLOGY:  No results found. 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 Acute respiratory failure with hypoxia and hypercapnia,in a patient with home-O2 dependent COPD.  CT scan of the chest shows no pulmonary embolism And had extubation and  Patient is not on full comfort care. Family wants IV fluids, steroids, nebulizers at the same time wants her on morphine drip and Ativan also. Confirmed with them and discussed with patient's husband and daughter and they  Both confirmed that they want her on morphine drip but also wants to continue IV fluids. And steroids.   #. Sepsis, with  positive blood cultures due to coag-negative staph She was on anabiotics, blood culture showed contamination, family does not want blood work but wants IV hydration,   #. History of fibromyalgia/depression -On morphine drip as  per patient's wishes. May change to morphine bolus prn per palliative care staff.  #. History of hyperlipidemia - hold Lipitor  #. History of GERD - Updated husband on the condition and status Prognosis really poor. I discussed with palliative care staff. She may be discharged to home with hospice or hospice home tomorrow. All the records are reviewed and case discussed with Care Management/Social Worker. Management plans discussed with the patient, her husband, and they are in agreement.  CODE STATUS: DNR  TOTAL TIME TAKING CARE OF THIS PATIENT: 35 minutes.   More than 50% of the time was spent in counseling/coordination of care: YES  POSSIBLE D/C IN 1-2 DAYS, DEPENDING ON CLINICAL CONDITION.   Demetrios Loll M.D on 12/23/2016 at 12:59 PM  Between 7am to 6pm - Pager - [phone]  After 6pm go to [email or web address] - Proofreader  [facility]  Office  [phone]  CC: Primary care physician; Birdie Sons, MD  Note: This dictation was prepared with Dragon dictation along with smaller phrase technology. Any transcriptional errors that result from this process are unintentional.

## 2016-12-24 DIAGNOSIS — Z7189 Other specified counseling: Secondary | ICD-10-CM | POA: Diagnosis not present

## 2016-12-24 DIAGNOSIS — Z515 Encounter for palliative care: Secondary | ICD-10-CM | POA: Diagnosis not present

## 2016-12-24 DIAGNOSIS — J441 Chronic obstructive pulmonary disease with (acute) exacerbation: Secondary | ICD-10-CM | POA: Diagnosis not present

## 2016-12-24 LAB — CULTURE, RESPIRATORY W GRAM STAIN

## 2016-12-24 LAB — CULTURE, RESPIRATORY

## 2016-12-24 LAB — SUSCEPTIBILITY RESULT

## 2016-12-24 LAB — SUSCEPTIBILITY, AER + ANAEROB

## 2016-12-24 MED ORDER — MECLIZINE HCL 25 MG PO TABS
12.5000 mg | ORAL_TABLET | Freq: Every day | ORAL | Status: DC | PRN
  Administered 2016-12-25: 12.5 mg via ORAL
  Filled 2016-12-24: qty 1

## 2016-12-24 MED ORDER — BISACODYL 5 MG PO TBEC: 10.0000 mg | DELAYED_RELEASE_TABLET | Freq: Every day | ORAL | Status: DC | PRN

## 2016-12-24 MED ORDER — SODIUM CHLORIDE 0.9 % IV SOLN
6.0000 mg/h | INTRAVENOUS | Status: DC
  Filled 2016-12-24: qty 10

## 2016-12-24 MED ORDER — HYDROCOD POLST-CPM POLST ER 10-8 MG/5ML PO SUER
5.0000 mL | Freq: Two times a day (BID) | ORAL | Status: DC
  Administered 2016-12-24 – 2016-12-25 (×2): 5 mL via ORAL
  Filled 2016-12-24 (×2): qty 5

## 2016-12-24 MED ORDER — MORPHINE SULFATE 25 MG/ML IV SOLN
6.0000 mg/h | INTRAVENOUS | Status: DC
  Administered 2016-12-24: 16:00:00 6 mg/h via INTRAVENOUS
  Filled 2016-12-24 (×3): qty 10

## 2016-12-24 MED ORDER — CHLORHEXIDINE GLUCONATE 0.12 % MT SOLN
15.0000 mL | Freq: Two times a day (BID) | OROMUCOSAL | Status: DC
  Administered 2016-12-24 – 2016-12-25 (×2): 15 mL via OROMUCOSAL
  Filled 2016-12-24 (×2): qty 15

## 2016-12-24 MED ORDER — PREDNISONE 50 MG PO TABS
50.0000 mg | ORAL_TABLET | Freq: Every day | ORAL | Status: DC
  Administered 2016-12-24 – 2016-12-25 (×2): 50 mg via ORAL
  Filled 2016-12-24 (×2): qty 1

## 2016-12-24 MED ORDER — BENZONATATE 100 MG PO CAPS
100.0000 mg | ORAL_CAPSULE | Freq: Three times a day (TID) | ORAL | Status: DC
Start: 2016-12-24 — End: 2016-12-25
  Administered 2016-12-24 – 2016-12-25 (×3): 100 mg via ORAL
  Filled 2016-12-24 (×3): qty 1

## 2016-12-24 NOTE — Progress Notes (Signed)
Daily Progress Note   Patient Name: Tiffany Mclean       Date: 12/24/2016 DOB: Jul 08, 1951  Age: 66 y.o. MRN#: [id number] Attending Physician: Demetrios Loll, MD Primary Care Physician: Birdie Sons, MD Admit Date: 12/11/2016  Reason for Consultation/Follow-up: Establishing goals of care, Extended family meeting, terminal care  Subjective: Tiffany Mclean continues to need morphine drip. Eating a little but not much. Husband Tiffany Mclean at bedside.   Length of Stay: 12  Current Medications: Scheduled Meds:  . chlorhexidine gluconate (MEDLINE KIT)  15 mL Mouth Rinse BID  . docusate sodium  100 mg Oral BID  . feeding supplement (ENSURE ENLIVE)  237 mL Oral TID BM  . mouth rinse  15 mL Mouth Rinse QID  . polyvinyl alcohol  1 drop Both Eyes QID  . predniSONE  50 mg Oral Q breakfast    Continuous Infusions: . dextrose 50 mL/hr at 12/23/16 0909  . morphine 6 mg/hr (12/23/16 2104)    PRN Meds: acetaminophen **OR** acetaminophen, antiseptic oral rinse, bisacodyl, bisacodyl, glycopyrrolate, ipratropium-albuterol, LORazepam, meclizine, morphine, [DISCONTINUED] ondansetron **OR** ondansetron (ZOFRAN) IV, polyvinyl alcohol  Physical Exam  Constitutional: She is oriented to person, place, and time. She appears well-developed and well-nourished.  Cardiovascular: Normal rate and regular rhythm.   Pulmonary/Chest: Accessory muscle usage present. Tachypnea noted. No bradypnea. She is in respiratory distress.  Continues to have episodes of resp distress but morphine bolus helps  Abdominal: Soft. Normal appearance. She exhibits no distension and no ascites.  Neurological: She is alert and oriented to person, place, and time.     Nursing note and vitals reviewed.           Vital Signs: BP 130/72 (BP Location:  Left Arm)   Pulse 94   Temp 98.8 F (37.1 C)   Resp 18   Ht _0  (1.626 m)   Wt 76.7 kg (169 lb 3.2 oz)   SpO2 100%   BMI 29.04 kg/m  SpO2: SpO2: 100 % O2 Device: O2 Device: Nasal Cannula O2 Flow Rate: O2 Flow Rate (L/min): 3 L/min  Intake/output summary:   Intake/Output Summary (Last 24 hours) at 12/24/16 1145 Last data filed at 12/24/16 0604  Gross per 24 hour  Intake               10 ml  Output  3900 ml  Net            -3890 ml   LBM: Last BM Date: 12/23/16 Baseline Weight: Weight: 59.9 kg (132 lb) Most recent weight: Weight: 76.7 kg (169 lb 3.2 oz)       Palliative Assessment/Data: PPS: 20%    Flowsheet Rows     Most Recent Value  Intake Tab  Referral Department  Critical care  Unit at Time of Referral  ICU  Palliative Care Primary Diagnosis  Pulmonary  Date Notified  12/12/16  Palliative Care Type  New Palliative care  Reason for referral  Clarify Goals of Care, Counsel Regarding Hospice, Non-pain Symptom  Date of Admission  12/12/16  Date first seen by Palliative Care  12/13/16  # of days Palliative referral response time  1 Day(s)  # of days IP prior to Palliative referral  0  Clinical Assessment  Palliative Performance Scale Score  20%  Anxiety Max Last 24 Hours  8  Anxiety Min Last 24 Hours  4  Psychosocial & Spiritual Assessment  Palliative Care Outcomes  Patient/Family meeting held?  Yes  Who was at the meeting?  patient and husband Tiffany Mclean  Palliative Care Outcomes  Improved non-pain symptom therapy, Clarified goals of care, Counseled regarding hospice      Patient Active Problem List   Diagnosis Date Noted  . Ventilator dependence ([place])   . Acute metabolic encephalopathy   . Encounter for hospice care discussion   . Palliative care encounter   . Cancer ([place])   . COPD (chronic obstructive pulmonary disease) ([place])   . Diabetes mellitus without complication ([place])   . Severe sepsis ([place])   . MRSA bacteremia   . Respiratory failure  ([place]) 12/12/2016  . Dysphagia 04/21/2016  . COPD exacerbation ([place]) 04/17/2016  . Tremor 06/13/2015  . Depression 05/15/2015  . Anxiety 02/13/2015  . Dermatitis, eczematoid 02/13/2015  . Hypokalemia 02/13/2015  . Lower extremity weakness 02/13/2015  . Leukocytosis 02/13/2015  . Magnesium deficiency 02/13/2015  . Symptomatic menopausal or female climacteric states 02/13/2015  . Panic disorder 02/13/2015  . Bed sore on buttock 02/08/2015  . COPD with emphysema ([place]) 11/28/2014  . Chronic respiratory failure with hypoxia ([place]) 11/28/2014  . GERD (gastroesophageal reflux disease) 11/28/2014  . Anemia of chronic disease 11/28/2014  . Pneumonia 11/23/2014  . Generalized anxiety disorder 11/23/2014  . Injury of kidney 10/31/2014  . Personal history of perinatal problems 10/24/2014  . Chronic pain associated with significant psychosocial dysfunction 02/21/2010  . Anemia, iron deficiency 01/29/2010  . Allergic rhinitis 12/01/2009  . DDD (degenerative disc disease), cervical 12/01/2009  . Major depression 06/30/2009  . Abdominal pain, generalized 03/21/2009  . Vitamin D deficiency 07/22/2008  . Hypercholesterolemia without hypertriglyceridemia 03/11/2007  . Essential and other specified forms of tremor 07/22/1998    Palliative Care Assessment & Plan   HPI: 66 y.o.femalewith past medical history of COPD on home oxygen, anxiety, hx of vaginal cancer,bedbound for the last 2+ yearswho was admitted on 5/23/2018with altered mental status and acute on chronic hypoxic respiratory failure. She was intubated and is being treated in the ICU. Blood cultures are positive for aerococcus bacteremia but thought to be a contaminate. Continues to have episodes of agitation on vent even with fentanyl, versed, and propofol. One way extubation 12/18/16. Has done better than previously expected pot extubation which has made decisions and coping difficult for family.    Assessment: Spoke more with Tiffany Mclean  and Tiffany Mclean today. Tiffany Mclean has been  worried that Tiffany Mclean does not understand that she is at EOL. I had a frank conversation with Tiffany Mclean about dying and she is mostly concerned about her family. I reassure her that our job is to make sure she is not suffering. She is overjoyed with the time that she has had since extubated to spend with her family. She seems to have good understanding of her current situation and poor prognosis at this time (although recognizing she may have periods of confusion where she does not understand - discussed with Tiffany Mclean). Tiffany Mclean continues to work with [name] to coordinate care with hope to go home with hospice.   I revisited with Tiffany Mclean and Tiffany Mclean and brother Tiffany Mclean at bedside. Tiffany Mclean says that they have decided that it is best for her to go to hospice facility as it has been difficult for them to get the help they need at home. I reassure him that I believe that she will be well taken care of at hospice. We have had multiple conversations regarding hospice philosophy and comfort care and he understands that she will NOT receive IVF at hospice facility or at home. He has also had concerns about infection and we have discussed that po antibiotics for a UTI may be an option if she is able to take pills but will need to be discussed with hospice staff. I have also explained how infection is often a part of the dying process. They will need much reassurance and assistance and I believe hospice facility will give them the support they need at this time.   Recommendations/Plan:  Pain/dyspnea: Morphine decreased to 6 mg/hr with 4 mg bolus every 30 min prn.   Anxiety: Ativan 0.5 mg every 4 hours prn.   Secretions improved and Robinul changed from scheduled to prn.   IVF restarted given families wishes and desire and with improved secretions.   Family still desires goal of comfort priority (limit labs/no CBGs and increased medication for comfort as needed) but also hoping for prolonged time with supportive  care (IVF, antibiotics as indicated, steroids) as time since extubation has been enjoyable and important to patient and family.   They do want her monitored for infection (okay for CBC) and given antibiotics if indicated.   Titrate oxygen to keep sats 90% (do not place on BiPAP or high flow machine). DO NOT TRANSFER TO ICU/SD/TELE.   Goals of Care and Additional Recommendations:  Limitations on Scope of Treatment: Minimize Medications  Code Status:  DNR  Prognosis:   Prognosis poor < 2 weeks.   Discharge Planning:  Hospice facility   Thank you for allowing the Palliative Medicine Team to assist in the care of this patient.   Total Time 60mn Prolonged Time Billed  no      Greater than 50%  of this time was spent counseling and coordinating care related to the above assessment and plan.  AVinie Sill NP Palliative Medicine Team Pager # [phone](M-F 8a-5p) Team Phone # [phone](Nights/Weekends)

## 2016-12-24 NOTE — Clinical Social Work Note (Signed)
Disposition pending, per Palliative Care NP. Awaiting choice. Pt is currently on a morphine drip. Pt will either discharge home with home Hospice services and private duty care givers or residential Hospice. RNCM is following for disposition as well. CSW will continue to follow.   Darden Dates, MSW, LCSW  Clinical Social Worker  [phone]

## 2016-12-24 NOTE — Progress Notes (Signed)
Tiffany Mclean at [place] NAME: Tiffany Mclean    MR#:  [id number]  DATE OF BIRTH:  11-May-1951  SUBJECTIVE:  CHIEF COMPLAINT:   Chief Complaint  Patient presents with  . Altered Mental Status   SOB and weakness, on O2 [place] 3 L and morphine drip.  REVIEW OF SYSTEMS:  Review of Systems  Constitutional: Positive for malaise/fatigue. Negative for chills and fever.  HENT: Negative for sore throat.   Eyes: Negative for blurred vision and double vision.  Respiratory: Positive for cough and shortness of breath. Negative for wheezing and stridor.   Cardiovascular: Negative for chest pain and leg swelling.  Gastrointestinal: Negative for abdominal pain, blood in stool, constipation, diarrhea, melena, nausea and vomiting.  Genitourinary: Negative for dysuria and hematuria.  Musculoskeletal: Negative for back pain.  Skin: Negative for itching and rash.  Neurological: Positive for weakness. Negative for focal weakness and loss of consciousness.  Psychiatric/Behavioral: Negative for depression. The patient is nervous/anxious.     DRUG ALLERGIES:   Allergies  Allergen Reactions  . Asa [Aspirin]   . Avelox [Moxifloxacin]   . Cephalexin   . Codeine   . Erythromycin Ethylsuccinate   . Flagyl [Metronidazole]   . Latex   . Levaquin [Levofloxacin] Hives  . Meloxicam   . Shellfish Allergy Nausea And Vomiting and Other (See Comments)    Dizzy, fainting  . Sulfa Antibiotics   . Topiramate   . Doxycycline Rash  . Tetracyclines & Related Rash   VITALS:  Blood pressure 130/72, pulse 94, temperature 98.8 F (37.1 C), resp. rate 18, height 5\' 4"  (1.626 m), weight 169 lb 3.2 oz (76.7 kg), SpO2 97 %. PHYSICAL EXAMINATION:  Physical Exam  Constitutional: She is well-developed, well-nourished, and in no distress.  HENT:  Mouth/Throat: Oropharynx is clear and moist.  Eyes: Conjunctivae and EOM are normal.  Neck: No JVD present. No tracheal deviation  present.  Cardiovascular: Normal rate, regular rhythm and normal heart sounds.  Exam reveals no gallop.   No murmur heard. Pulmonary/Chest: Effort normal. No respiratory distress. She has no wheezes. She has no rales.  very diminished lung SOUNDS.  Abdominal: Soft. Bowel sounds are normal. She exhibits no distension. There is no tenderness.  Musculoskeletal: She exhibits no edema or tenderness.  Skin: No rash noted. No erythema.   LABORATORY PANEL:  Female CBC  Recent Labs Lab 12/21/16 0428  WBC 11.2*  HGB 9.8*  HCT 31.2*  PLT 460*   ------------------------------------------------------------------------------------------------------------------ Chemistries  No results for input(s): NA, K, CL, CO2, GLUCOSE, BUN, CREATININE, CALCIUM, MG, AST, ALT, ALKPHOS, BILITOT in the last 168 hours.  Invalid input(s): GFRCGP RADIOLOGY:  No results found. 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 Acute respiratory failure with hypoxia and hypercapnia,in a patient with home-O2 dependent COPD.  CT scan of the chest shows no pulmonary embolism And had extubation and  Patient is not on full comfort care. Family wants IV fluids, steroids, nebulizers at the same time wants her on morphine drip and Ativan also. Confirmed with them and discussed with patient's husband and daughter and they  Both confirmed that they want her on morphine drip but also wants to continue IV fluids. Change to prednisone po.   #. Sepsis, with positive blood cultures due to coag-negative staph She was on  anabiotics, blood culture showed contamination, family does not want blood work but wants IV hydration,   #. History of fibromyalgia/depression -On morphine drip as per patient's wishes. May change to morphine bolus prn per palliative care  staff.  #. History of hyperlipidemia - hold Lipitor  #. History of GERD - Updated husband on the condition and status Prognosis really poor. I discussed with palliative care staff. She may be discharged to home with hospice or hospice home tomorrow. All the records are reviewed and case discussed with Care Management/Social Worker. Management plans discussed with the patient, her husband, and they are in agreement.  CODE STATUS: DNR  TOTAL TIME TAKING CARE OF THIS PATIENT: 38 minutes.   More than 50% of the time was spent in counseling/coordination of care: YES  POSSIBLE D/C IN 1-2 DAYS, DEPENDING ON CLINICAL CONDITION.   Demetrios Loll M.D on 12/24/2016 at 12:52 PM  Between 7am to 6pm - Pager - [phone]  After 6pm go to [email or web address] - Proofreader  [facility]  Office  [phone]  CC: Primary care physician; Birdie Sons, MD  Note: This dictation was prepared with Dragon dictation along with smaller phrase technology. Any transcriptional errors that result from this process are unintentional.

## 2016-12-25 ENCOUNTER — Telehealth: Payer: Self-pay | Admitting: Family Medicine

## 2016-12-25 MED ORDER — POLYVINYL ALCOHOL 1.4 % OP SOLN: 1.0000 [drp] | OPHTHALMIC | 0 refills | Status: AC | PRN

## 2016-12-25 MED ORDER — PREDNISONE 10 MG PO TABS: ORAL_TABLET | ORAL | 0 refills | Status: AC

## 2016-12-25 MED ORDER — BENZONATATE 100 MG PO CAPS: 100.0000 mg | ORAL_CAPSULE | Freq: Three times a day (TID) | ORAL | 0 refills | Status: AC

## 2016-12-25 MED ORDER — LORAZEPAM 2 MG/ML IJ SOLN: 0.5000 mg | INTRAMUSCULAR | 0 refills | Status: AC | PRN

## 2016-12-25 MED ORDER — BISACODYL 5 MG PO TBEC: 10.0000 mg | DELAYED_RELEASE_TABLET | Freq: Every day | ORAL | 0 refills | Status: AC | PRN

## 2016-12-25 MED ORDER — GLYCOPYRROLATE 0.2 MG/ML IJ SOLN: 0.2000 mg | INTRAMUSCULAR | Status: AC | PRN

## 2016-12-25 MED ORDER — MORPHINE BOLUS VIA INFUSION: 4.0000 mg | INTRAVENOUS | 0 refills | Status: DC | PRN

## 2016-12-25 MED ORDER — SODIUM CHLORIDE 0.9 % IV SOLN: 6.0000 mg/h | INTRAVENOUS | 0 refills | Status: AC

## 2016-12-25 MED ORDER — HYDROCOD POLST-CPM POLST ER 10-8 MG/5ML PO SUER: 5.0000 mL | Freq: Two times a day (BID) | ORAL | Status: AC

## 2016-12-25 MED ORDER — SODIUM CHLORIDE 0.9 % IV SOLN: 5.0000 mg/h | INTRAVENOUS | Status: DC

## 2016-12-25 NOTE — Telephone Encounter (Signed)
A nurse from [facility] called and schedule pt a hospital f/u with Dr. Caryn Section for 01/08/17 @ 230 pm in a 30 slot. Pt is being discharged today 12/25/16 and was treated for respiratory failure. Thanks TNP

## 2016-12-25 NOTE — Progress Notes (Signed)
New hospice home referral received from [facility]. Patient is a 65 year old woman with a  PMH of oxygen dependent COPD, anxiety, vaginal cancer who has been bed bound at home for past 2 years, admitted to [facility] on 5/23 with respiratory failure requiring intubation. Palliative Medicine was consulted on 5/24 and has been very involved with the patient and family in regards to goals of care. Patient was extubated on 5/30 and was not expected to survive, she was initiated on a continuous morphine drip at 10 mg/hr for management of pain and dyspnea. Rate is currently 6 mg/hr. After much discussion family has chosen to focus further on her comfort with transfer to the hospice home. Writer met in the room with patient and her husband [name] to initiate education regarding hospice services, philosophy and team approach to care with good understanding voiced. Questions answered, consents signed. Mrs. Birky was alert through out the conversation, but did appear to have some memory impairment. Increased work of breathing noted with conversation. Emotional support given. Patient information faxed to referral. Signed prescription for continuous morphine drip obtained from attending MD Dr. Bridgett Larsson and faxed to triage. Report called to the hospice home, EMS notified for transport. Family, patient and hospital care all aware. Signed DNR in place in discharge packet. Thank you. Flo Shanks RN, BSN, [facility] [place], hospital Liaison [phone] c

## 2016-12-25 NOTE — Discharge Summary (Signed)
Tiffany Mclean at [place] NAME: Tiffany Mclean    MR#:  [id number]  DATE OF BIRTH:  August 13, 1950  DATE OF ADMISSION:  12/11/2016   ADMITTING PHYSICIAN: Harvie Bridge, DO  DATE OF DISCHARGE: 12/25/2016 PRIMARY CARE PHYSICIAN: Birdie Sons, MD   ADMISSION DIAGNOSIS:  Polysubstance abuse [F19.10] Severe sepsis ([place]) [A41.9, R65.20] Acute respiratory failure with hypoxia and hypercapnia (HCC) [J96.01, N27.78] Acute metabolic encephalopathy [E42.35] DISCHARGE DIAGNOSIS:  Active Problems:   Respiratory failure (HCC)   Cancer (HCC)   COPD (chronic obstructive pulmonary disease) (HCC)   Diabetes mellitus without complication (HCC)   Severe sepsis (HCC)   MRSA bacteremia   Acute metabolic encephalopathy   Goals of care, counseling/discussion   Palliative care encounter   Ventilator dependence ([place])  SECONDARY DIAGNOSIS:   Past Medical History:  Diagnosis Date  . Cancer (HCC)    squamous cell, vaginal  . COPD (chronic obstructive pulmonary disease) ([place])   . Diabetes mellitus without complication ([place])   . 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 Acute on chronic respiratory failure with hypoxia and hypercapnia,in a patient with home-O2 dependent COPD.  CT scan of the chest shows no pulmonary embolism And had extubationand Patient is not on full comfort care. Family wants IV fluids, steroids, nebulizers at the same time wants her on morphine drip and Ativan also. Confirmed with them and discussed with patient's husband and daughter and they Both confirmed that they want her on morphine drip but also wants to continue IV fluids. Changed to prednisone po. Tussenix, ativan prn. Discontinue IVF on discharge.  #. Sepsis, with positive blood  cultures due to coag-negative staph She was on anabiotics, blood culture showed contamination, family does not want blood work but wants IV hydration,   #. History of fibromyalgia/depression -On morphine drip as per patient's wishes.   #. History of hyperlipidemia - hold Lipitor  #. History of GERD  - Updated husband on the condition and status Prognosis really poor. I discussed with hospice staff.  DISCHARGE CONDITIONS:  Poor prognosis, discharge to hospice home today. CONSULTS OBTAINED:   DRUG ALLERGIES:   Allergies  Allergen Reactions  . Asa [Aspirin]   . Avelox [Moxifloxacin]   . Cephalexin   . Codeine   . Erythromycin Ethylsuccinate   . Flagyl [Metronidazole]   . Latex   . Levaquin [Levofloxacin] Hives  . Meloxicam   . Shellfish Allergy Nausea And Vomiting and Other (See Comments)    Dizzy, fainting  . Sulfa Antibiotics   . Topiramate   . Doxycycline Rash  . Tetracyclines & Related Rash   DISCHARGE MEDICATIONS:   Allergies as of 12/25/2016      Reactions   Asa [aspirin]    Avelox [moxifloxacin]    Cephalexin    Codeine    Erythromycin Ethylsuccinate    Flagyl [metronidazole]    Latex    Levaquin [levofloxacin] Hives   Meloxicam    Shellfish Allergy Nausea And Vomiting, Other (See Comments)   Dizzy, fainting   Sulfa Antibiotics    Topiramate    Doxycycline Rash   Tetracyclines & Related Rash      Medication List    STOP taking these medications   ALPRAZolam 0.25 MG tablet Commonly known as:  XANAX   amoxicillin 500  MG capsule Commonly known as:  AMOXIL   atorvastatin 40 MG tablet Commonly known as:  LIPITOR   DULoxetine 30 MG capsule Commonly known as:  CYMBALTA   guaiFENesin-dextromethorphan 100-10 MG/5ML syrup Commonly known as:  ROBITUSSIN DM   meclizine 12.5 MG tablet Commonly known as:  ANTIVERT   morphine 60 MG 24 hr capsule Commonly known as:  KADIAN   Potassium 99 MG Tabs   RABEprazole 20 MG tablet Commonly known as:   ACIPHEX   sodium chloride 0.65 % Soln nasal spray Commonly known as:  OCEAN   tiotropium 18 MCG inhalation capsule Commonly known as:  SPIRIVA HANDIHALER     TAKE these medications   acetaminophen 325 MG tablet Commonly known as:  TYLENOL Take 650 mg by mouth every 6 (six) hours as needed for mild pain or fever.   albuterol 1.25 MG/3ML nebulizer solution Commonly known as:  ACCUNEB Take 1 ampule by nebulization 4 (four) times daily as needed for shortness of breath. What changed:  Another medication with the same name was removed. Continue taking this medication, and follow the directions you see here.   benzonatate 100 MG capsule Commonly known as:  TESSALON Take 1 capsule (100 mg total) by mouth 3 (three) times daily.   bisacodyl 5 MG EC tablet Commonly known as:  DULCOLAX Take 2 tablets (10 mg total) by mouth daily as needed for moderate constipation.   chlorpheniramine-HYDROcodone 10-8 MG/5ML Suer Commonly known as:  TUSSIONEX Take 5 mLs by mouth every 12 (twelve) hours.   docusate sodium 100 MG capsule Commonly known as:  COLACE Take 100 mg by mouth 2 (two) times daily as needed for mild constipation.   glycopyrrolate 0.2 MG/ML injection Commonly known as:  ROBINUL Inject 1 mL (0.2 mg total) into the vein every 4 (four) hours as needed (secretions, gurgling).   ipratropium 0.02 % nebulizer solution Commonly known as:  ATROVENT Take 0.5 mg by nebulization every 6 (six) hours as needed for wheezing or shortness of breath.   ipratropium-albuterol 0.5-2.5 (3) MG/3ML Soln Commonly known as:  DUONEB Take 3 mLs by nebulization every 4 (four) hours as needed.   LORazepam 2 MG/ML injection Commonly known as:  ATIVAN Inject 0.25 mLs (0.5 mg total) into the vein every 4 (four) hours as needed for anxiety.   morphine 250 mg in sodium chloride 0.9 % 240 mL Inject 6 mg/hr into the vein continuous.   polyvinyl alcohol 1.4 % ophthalmic solution Commonly known as:   LIQUIFILM TEARS Place 1 drop into both eyes as needed for dry eyes.   predniSONE 10 MG tablet Commonly known as:  DELTASONE 40 mg po daily for 2 days, 20 mg po daily for 2 days, 10 mg po daily for 3 days.        DISCHARGE INSTRUCTIONS:  See AVS.  If you experience worsening of your admission symptoms, develop shortness of breath, life threatening emergency, suicidal or homicidal thoughts you must seek medical attention immediately by calling 911 or calling your MD immediately  if symptoms less severe.  You Must read complete instructions/literature along with all the possible adverse reactions/side effects for all the Medicines you take and that have been prescribed to you. Take any new Medicines after you have completely understood and accpet all the possible adverse reactions/side effects.   Please note  You were cared for by a hospitalist during your hospital stay. If you have any questions about your discharge medications or the care you received while you  were in the hospital after you are discharged, you can call the unit and asked to speak with the hospitalist on call if the hospitalist that took care of you is not available. Once you are discharged, your primary care physician will handle any further medical issues. Please note that NO REFILLS for any discharge medications will be authorized once you are discharged, as it is imperative that you return to your primary care physician (or establish a relationship with a primary care physician if you do not have one) for your aftercare needs so that they can reassess your need for medications and monitor your lab values.    On the day of Discharge:  VITAL SIGNS:  Blood pressure (!) 87/65, pulse 82, temperature 99.1 F (37.3 C), temperature source Oral, resp. rate 20, height 5\' 4"  (1.626 m), weight 169 lb 3.2 oz (76.7 kg), SpO2 91 %. PHYSICAL EXAMINATION:  GENERAL:  66 y.o.-year-old patient lying in the bed with no acute distress. On  morphine drip and O2 [place] 3L. EYES: Pupils equal, round, reactive to light and accommodation. No scleral icterus. Extraocular muscles intact.  HEENT: Head atraumatic, normocephalic. Oropharynx and nasopharynx clear.  NECK:  Supple, no jugular venous distention. No thyroid enlargement, no tenderness.  LUNGS: diminshedl breath sounds bilaterally, no wheezing, rales,rhonchi or crepitation. No use of accessory muscles of respiration.  CARDIOVASCULAR: S1, S2 normal. No murmurs, rubs, or gallops.  ABDOMEN: Soft, non-tender, non-distended. Bowel sounds present. No organomegaly or mass.  EXTREMITIES: No pedal edema, cyanosis, or clubbing.  NEUROLOGIC: Cranial nerves II through XII are intact. Muscle strength 5/5 in all extremities. Sensation intact. Gait not checked.  PSYCHIATRIC: The patient is alert and oriented x 3.  SKIN: No obvious rash, lesion, or ulcer.  DATA REVIEW:   CBC  Recent Labs Lab 12/21/16 0428  WBC 11.2*  HGB 9.8*  HCT 31.2*  PLT 460*    Chemistries  No results for input(s): NA, K, CL, CO2, GLUCOSE, BUN, CREATININE, CALCIUM, MG, AST, ALT, ALKPHOS, BILITOT in the last 168 hours.  Invalid input(s): [place]   Microbiology Results  Results for orders placed or performed during the hospital encounter of 12/11/16  Blood Culture (routine x 2)     Status: Abnormal   Collection Time: 12/11/16 10:52 PM  Result Value Ref Range Status   Specimen Description BLOOD RT HAND  Final   Special Requests   Final    BOTTLES DRAWN AEROBIC AND ANAEROBIC Blood Culture adequate volume   Culture  Setup Time   Final    GRAM POSITIVE COCCI GRAM POSITIVE RODS IN BOTH AEROBIC AND ANAEROBIC BOTTLES CRITICAL RESULT CALLED TO, READ BACK BY AND VERIFIED WITH: JASON ROBBINS AT 1808 12/12/2016 BY TFK. JASON ROBBINS AT 2120 ON 12/12/2016 JJB    Culture (A)  Final    STAPHYLOCOCCUS SPECIES (COAGULASE NEGATIVE) THE SIGNIFICANCE OF ISOLATING THIS ORGANISM FROM A SINGLE SET OF BLOOD CULTURES WHEN MULTIPLE  SETS ARE DRAWN IS UNCERTAIN. PLEASE NOTIFY THE MICROBIOLOGY DEPARTMENT WITHIN ONE WEEK IF SPECIATION AND SENSITIVITIES ARE REQUIRED. DIPHTHEROIDS(CORYNEBACTERIUM SPECIES) Standardized susceptibility testing for this organism is not available. Performed at [facility], [place] [address], [place], [place] [address]    Report Status 12/14/2016 FINAL  Final  Blood Culture (routine x 2)     Status: None   Collection Time: 12/11/16 10:52 PM  Result Value Ref Range Status   Specimen Description BLOOD L FA  Final   Special Requests BOTTLES DRAWN AEROBIC AND ANAEROBIC [place]  Final  Culture NO GROWTH 5 DAYS  Final   Report Status 12/16/2016 FINAL  Final  Urine culture     Status: None   Collection Time: 12/11/16 10:52 PM  Result Value Ref Range Status   Specimen Description URINE, RANDOM  Final   Special Requests NONE  Final   Culture   Final    NO GROWTH Performed at [facility], [place] [address], [place], [place] [address]    Report Status 12/13/2016 FINAL  Final  Blood Culture ID Panel (Reflexed)     Status: Abnormal   Collection Time: 12/11/16 10:52 PM  Result Value Ref Range Status   Enterococcus species NOT DETECTED NOT DETECTED Final   Listeria monocytogenes NOT DETECTED NOT DETECTED Final   Staphylococcus species DETECTED (A) NOT DETECTED Final    Comment: Methicillin (oxacillin) resistant coagulase negative staphylococcus. Possible blood culture contaminant (unless isolated from more than one blood culture draw or clinical case suggests pathogenicity). No antibiotic treatment is indicated for blood  culture contaminants. CRITICAL RESULT CALLED TO, READ BACK BY AND VERIFIED WITH: JASON ROBBINS AT 1808 12/12/2016 BY TFK.    Staphylococcus aureus NOT DETECTED NOT DETECTED Final   Methicillin resistance DETECTED (A) NOT DETECTED Final    Comment: CRITICAL RESULT CALLED TO, READ BACK BY AND VERIFIED WITH: JASON ROBBINS AT 8338 12/12/2016 BY TFK.    Streptococcus species NOT  DETECTED NOT DETECTED Final   Streptococcus agalactiae NOT DETECTED NOT DETECTED Final   Streptococcus pneumoniae NOT DETECTED NOT DETECTED Final   Streptococcus pyogenes NOT DETECTED NOT DETECTED Final   Acinetobacter baumannii NOT DETECTED NOT DETECTED Final   Enterobacteriaceae species NOT DETECTED NOT DETECTED Final   Enterobacter cloacae complex NOT DETECTED NOT DETECTED Final   Escherichia coli NOT DETECTED NOT DETECTED Final   Klebsiella oxytoca NOT DETECTED NOT DETECTED Final   Klebsiella pneumoniae NOT DETECTED NOT DETECTED Final   Proteus species NOT DETECTED NOT DETECTED Final   Serratia marcescens NOT DETECTED NOT DETECTED Final   Haemophilus influenzae NOT DETECTED NOT DETECTED Final   Neisseria meningitidis NOT DETECTED NOT DETECTED Final   Pseudomonas aeruginosa NOT DETECTED NOT DETECTED Final   Candida albicans NOT DETECTED NOT DETECTED Final   Candida glabrata NOT DETECTED NOT DETECTED Final   Candida krusei NOT DETECTED NOT DETECTED Final   Candida parapsilosis NOT DETECTED NOT DETECTED Final   Candida tropicalis NOT DETECTED NOT DETECTED Final  MRSA PCR Screening     Status: None   Collection Time: 12/12/16  2:17 AM  Result Value Ref Range Status   MRSA by PCR NEGATIVE NEGATIVE Final    Comment:        The GeneXpert MRSA Assay (FDA approved for NASAL specimens only), is one component of a comprehensive MRSA colonization surveillance program. It is not intended to diagnose MRSA infection nor to guide or monitor treatment for MRSA infections.   CULTURE, BLOOD (ROUTINE X 2) w Reflex to ID Panel     Status: None   Collection Time: 12/15/16  8:19 AM  Result Value Ref Range Status   Specimen Description BLOOD RIGHT WRIST  Final   Special Requests   Final    BOTTLES DRAWN AEROBIC AND ANAEROBIC Blood Culture adequate volume   Culture NO GROWTH 5 DAYS  Final   Report Status 12/20/2016 FINAL  Final  CULTURE, BLOOD (ROUTINE X 2) w Reflex to ID Panel     Status:  Abnormal   Collection Time: 12/15/16  8:39 AM  Result  Value Ref Range Status   Specimen Description BLOOD LEFT HAND  Final   Special Requests AEROCOCCUS SPECIES Blood Culture adequate volume (A)  Final   Culture NO GROWTH 5 DAYS  Final   Report Status 12/20/2016 FINAL  Final  Culture, respiratory (NON-Expectorated)     Status: None   Collection Time: 12/15/16  8:40 AM  Result Value Ref Range Status   Specimen Description TRACHEAL ASPIRATE  Final   Special Requests NONE  Final   Gram Stain   Final    RARE WBC PRESENT,BOTH PMN AND MONONUCLEAR FEW GRAM POSITIVE COCCI    Culture   Final    MODERATE STAPHYLOCOCCUS EPIDERMIDIS SEE SEPARATE REPORT Performed at [facility], [place] [address] [place], [place] [address]    Report Status 12/24/2016 FINAL  Final  Susceptibility, Aer + Anaerob     Status: None   Collection Time: 12/15/16  8:40 AM  Result Value Ref Range Status   Suscept, Aer + Anaerob Final report  Final    Comment: (NOTE) Performed At: [facility] [place], [place] [id number] Lindon Romp MD [id number]    Source of Sample TRACHEAL ASPIRATE  Final    Comment: Performed at [facility], [place] [address], [place], [place] [address]  Susceptibility Result     Status: Abnormal   Collection Time: 12/15/16  8:40 AM  Result Value Ref Range Status   Suscept Result 1 Comment (A)  Final    Comment: (NOTE) Staphylococcus epidermidis Based on resistance to oxacillin this isolate would be resistant to all currently available beta-lactam antimicrobial agents, with the exception of the newer cephalosporins with anti-MRSA activity, such as Ceftaroline    Antimicrobial Suscept Comment  Final    Comment: (NOTE)      ** S = Susceptible; I = Intermediate; R = Resistant **                   P = Positive; N = Negative            MICS are expressed in micrograms per mL   Antibiotic                 RSLT#1    RSLT#2    RSLT#3    RSLT#4 Ciprofloxacin                   R Clindamycin                    R Erythromycin                   R Gentamicin                     S Levofloxacin                   R Linezolid                      S Oxacillin                      R Penicillin                     R Rifampin                       S Tetracycline  S Trimethoprim/Sulfa             S Vancomycin                     S Performed At: [facility] [place], [place] [id number] Lindon Romp MD [id number] Performed at [facility], [place] [address] [place], [place] [address]     RADIOLOGY:  No results found.   Management plans discussed with the patient, her husand and they are in agreement.  CODE STATUS: DNR   TOTAL TIME TAKING CARE OF THIS PATIENT: 46 minutes.    Demetrios Loll M.D on 12/25/2016 at 11:02 AM  Between 7am to 6pm - Pager - [phone]  After 6pm go to [email or web address] - Proofreader  [facility]  Office  [phone]  CC: Primary care physician; Birdie Sons, MD   Note: This dictation was prepared with Dragon dictation along with smaller phrase technology. Any transcriptional errors that result from this process are unintentional.

## 2016-12-25 NOTE — Clinical Social Work Note (Signed)
Clinical Social Work Assessment  Patient Details  Name: Tiffany Mclean MRN: [id number] Date of Birth: October 08, 1950  Date of referral:  12/25/16               Reason for consult:  Facility Placement, Discharge Planning, End of Life/Hospice                Permission sought to share information with:  Family Supports Permission granted to share information::  Yes, Verbal Permission Granted  Name::     Tiffany Mclean  Relationship::  spouse  Contact Information:  [phone]  Housing/Transportation Living arrangements for the past 2 months:  [place] of Information:  Patient, Spouse Patient Interpreter Needed:  None Criminal Activity/Legal Involvement Pertinent to Current Situation/Hospitalization:  No - Comment as needed Significant Relationships:  Adult Children, Other Family Members, Spouse Lives with:  Spouse Do you feel safe going back to the place where you live?  No (Pt is in need of a higher level of care) Need for family participation in patient care:  Yes (Comment)  Care giving concerns:  No care giving concerns identified.   Social Worker assessment / plan:  CSW met with pt and spouse to address consult for Residential Hospice, along with Palliative Care NP. CSW introduced herself and explained role of social work. CSW also explained the process of discharging to a residential hospice facility. CSW presented choice. Pt's spouse chose [facility]. Pt is ready for discharge today. CSW notified Hospice Liaison. Pt has moments of being lucid as she is alert. CSW supportive counseling to pt's husband around transitioning to hospice care outside the hospital. Report will be called. [facility] will provide transportation. CSW is signing off as no further needs identified.   Employment status:  Retired Health visitor, Managed Care PT Recommendations:  Not assessed at this time Information / Referral to community resources:  Other  (Comment Required) (Residential Hospice)  Patient/Family's Response to care:  Pt's spouse was appreciative of CSW support.   Patient/Family's Understanding of and Emotional Response to Diagnosis, Current Treatment, and Prognosis:  Pt and spouse understand that pt's prognosis is poor and will be supported by Hospice at discharge.   Emotional Assessment Appearance:  Appears stated age Attitude/Demeanor/Rapport:   (Appropriate) Affect (typically observed):  Pleasant Orientation:  Fluctuating Orientation (Suspected and/or reported Sundowners), Oriented to Self Alcohol / Substance use:    Psych involvement (Current and /or in the community):  No (Comment)  Discharge Needs  Concerns to be addressed:  Grief and Loss Concerns Readmission within the last 30 days:    Current discharge risk:  Terminally ill Barriers to Discharge:  No Barriers Identified   Darden Dates, LCSW 12/25/2016, 11:28 AM

## 2016-12-25 NOTE — Discharge Instructions (Signed)
Hospice care

## 2016-12-25 NOTE — Plan of Care (Signed)
Problem: Education: Goal: Knowledge of [facility] Education information/materials will improve Outcome: Progressing Afebrile, SpO2 >90%, hypotensive during shift.  Breathing labored early in shift, pt reported increased WOB.  Received PRN Duoneb x1, PRN IV Morphine 4mg  bolus x1.  No other needs overnight.  Denies pain.  Husband at bedside, call bell within reach.  WCTM.

## 2016-12-25 NOTE — Progress Notes (Signed)
  Speech Language Pathology Treatment: Dysphagia  Patient Details Name: Tiffany Mclean MRN: [id number] DOB: Aug 11, 1950 Today's Date: 12/25/2016 Time: 0383-3383 SLP Time Calculation (min) (ACUTE ONLY): 35 min  Assessment / Plan / Recommendation Clinical Impression  Pt continues to appear at increased risk for aspiration d/t overall weakness, extended illness, pulmonary decline w/ quick fatigue/SOB from any exertion, and drowsiness at times from continuous sedating medication being given. Pt appeared to manage and tolerate few small trials of thin liquids following strict aspiration precautions/strategies to support pt w/ oral intake. Pt's presentation and risks were thoroughly discussed w/ Husband as were the support strategies and aspiration precautions to help lessen risk for aspiration. Also discussed food consistencies easier for pt as well as food preparation and choices to avoid increased exertion w/ intake. Recommend a dysphagia level 3 for the cut meats and moistened foods - suggested to Husband that they bring in some of pt's favoriate foods for her. ST services will be available for further education on aspiration precautions and support strategies for pt as needed while she is admitted. Husband agreed and gave appreciation for time w/ them discussing her needs.    HPI HPI: Pt is a 66 y.o. female with past medical history of COPD on home oxygen, anxiety, hx of vaginal cancer, bedbound for the last 2+ years who was admitted on 12/11/2016 with altered mental status and acute on chronic hypoxic respiratory failure. She was intubated and is being treated in the ICU.  Blood cultures are positive for aerococcus bacteremia but thought to be a contaminate.  Continues to have episodes of agitation on vent even with fentanyl, versed, and propofol. One way extubation 12/18/16. Patient is not on full comfort care. Family wants IV fluids, steroids, nebulizers at the same time wants her on morphine drip and  Ativan also at this time; it appears comfort care is still being discussed w/ Palliative Care/MD. Informed that pt will receive Hospice services at discharge. Husband and pt seemed pleased w/ pt's eating/drinking, and Husband denied any gross, overt s/s of aspiration/dysphagia but he acknowledges she gets SOB w/ exertion.       SLP Plan  All goals met       Recommendations  Diet recommendations: Dysphagia 3 (mechanical soft);Thin liquid (focusing on pt's favorite preferences of foods/liquids) Liquids provided via: Cup;Straw (monitoring) Medication Administration: Crushed with puree (for easier management as needed; whole in puree) Supervision: Full supervision/cueing for compensatory strategies;Staff to assist with self feeding Compensations: Minimize environmental distractions;Slow rate;Small sips/bites;Lingual sweep for clearance of pocketing;Multiple dry swallows after each bite/sip;Follow solids with liquid (rest breaks w/ po's) Postural Changes and/or Swallow Maneuvers: Seated upright 90 degrees;Upright 30-60 min after meal                General recommendations:  (palliative care following) Oral Care Recommendations: Oral care BID;Staff/trained caregiver to provide oral care Follow up Recommendations: None SLP Visit Diagnosis: Dysphagia, oropharyngeal phase (R13.12) Plan: All goals met       GO                Tiffany Kenner, MS, CCC-SLP Tiffany Mclean 12/25/2016, 4:46 PM

## 2016-12-26 ENCOUNTER — Telehealth: Payer: Self-pay | Admitting: Family Medicine

## 2016-12-26 LAB — BLOOD GAS, VENOUS
Patient temperature: 37
pCO2, Ven: 120 mmHg (ref 44.0–60.0)
pH, Ven: 7.18 — CL (ref 7.250–7.430)
pO2, Ven: 34 mmHg (ref 32.0–45.0)

## 2016-12-26 LAB — BLOOD GAS, ARTERIAL
BICARBONATE: UNDETERMINED mmol/L (ref 20.0–28.0)
FIO2: 0.8
MECHVT: 450 mL
Mechanical Rate: 18
PATIENT TEMPERATURE: 37
PCO2 ART: 120 mmHg — AB (ref 32.0–48.0)
PEEP/CPAP: 5 cmH2O
PH ART: 7 — AB (ref 7.350–7.450)
pO2, Arterial: 156 mmHg — ABNORMAL HIGH (ref 83.0–108.0)

## 2016-12-26 NOTE — Telephone Encounter (Signed)
Crystal with [employer] wanted you to know that Tiffany Mclean is enrolled in the case management program with [employer].  If you have any questions or concerns, you can call her.

## 2016-12-31 NOTE — Telephone Encounter (Signed)
Tried to contact patient for TCM twice and left a voicemail to return the call both times. No returned calls. FYI! -MM

## 2017-01-08 ENCOUNTER — Inpatient Hospital Stay: Payer: Self-pay | Admitting: Family Medicine

## 2017-02-10 ENCOUNTER — Telehealth: Payer: Self-pay | Admitting: Family Medicine

## 2017-02-19 NOTE — Telephone Encounter (Signed)
Leann with Hospice called to say that Tiffany Mclean has expired.  Expired date   2017/02/26   1:12 am  Thanks [employer]

## 2017-02-19 DEATH — deceased
